# Patient Record
Sex: Male | Born: 1960 | Race: White | Hispanic: No | Marital: Single | Smoking: Current some day smoker
Health system: Southern US, Community
[De-identification: ages and names within clinical notes are randomized; demographics above are authoritative.]

## PROBLEM LIST (undated history)

## (undated) DIAGNOSIS — H269 Unspecified cataract: Secondary | ICD-10-CM

## (undated) DIAGNOSIS — E039 Hypothyroidism, unspecified: Secondary | ICD-10-CM

## (undated) DIAGNOSIS — A159 Respiratory tuberculosis unspecified: Secondary | ICD-10-CM

## (undated) DIAGNOSIS — I10 Essential (primary) hypertension: Secondary | ICD-10-CM

## (undated) DIAGNOSIS — J449 Chronic obstructive pulmonary disease, unspecified: Secondary | ICD-10-CM

## (undated) DIAGNOSIS — H9190 Unspecified hearing loss, unspecified ear: Secondary | ICD-10-CM

## (undated) DIAGNOSIS — E079 Disorder of thyroid, unspecified: Secondary | ICD-10-CM

## (undated) DIAGNOSIS — E58 Dietary calcium deficiency: Secondary | ICD-10-CM

## (undated) DIAGNOSIS — G4730 Sleep apnea, unspecified: Secondary | ICD-10-CM

## (undated) DIAGNOSIS — A389 Scarlet fever, uncomplicated: Secondary | ICD-10-CM

## (undated) DIAGNOSIS — J189 Pneumonia, unspecified organism: Secondary | ICD-10-CM

## (undated) HISTORY — DX: Essential (primary) hypertension: I10

## (undated) HISTORY — DX: Chronic obstructive pulmonary disease, unspecified: J44.9

## (undated) HISTORY — PX: COLONOSCOPY: SHX174

## (undated) HISTORY — DX: Unspecified cataract: H26.9

## (undated) HISTORY — DX: Dietary calcium deficiency: E58

## (undated) HISTORY — DX: Scarlet fever, uncomplicated: A38.9

## (undated) HISTORY — DX: Sleep apnea, unspecified: G47.30

## (undated) HISTORY — PX: CHOLECYSTECTOMY: SHX55

## (undated) HISTORY — PX: THYROIDECTOMY: SHX17

---

## 2020-05-03 ENCOUNTER — Other Ambulatory Visit: Payer: Self-pay

## 2020-05-03 ENCOUNTER — Other Ambulatory Visit: Payer: Self-pay | Admitting: Obstetrics and Gynecology

## 2020-05-03 ENCOUNTER — Ambulatory Visit
Admission: RE | Admit: 2020-05-03 | Discharge: 2020-05-03 | Disposition: A | Discharge status: Home / Self Care | Payer: Self-pay | Source: Ambulatory Visit | Attending: Obstetrics and Gynecology | Admitting: Obstetrics and Gynecology

## 2020-05-03 DIAGNOSIS — R918 Other nonspecific abnormal finding of lung field: Secondary | ICD-10-CM

## 2020-08-28 ENCOUNTER — Emergency Department (HOSPITAL_COMMUNITY)
Admission: EM | Admit: 2020-08-28 | Discharge: 2020-08-29 | Discharge status: Against Medical Advice | Payer: Medicare HMO

## 2020-08-28 NOTE — ED Notes (Signed)
LWBS 

## 2020-08-29 ENCOUNTER — Emergency Department (HOSPITAL_BASED_OUTPATIENT_CLINIC_OR_DEPARTMENT_OTHER)
Admission: EM | Admit: 2020-08-29 | Discharge: 2020-08-29 | Disposition: A | Discharge status: Home / Self Care | Payer: Medicare HMO | Attending: Emergency Medicine | Admitting: Emergency Medicine

## 2020-08-29 ENCOUNTER — Other Ambulatory Visit: Payer: Self-pay

## 2020-08-29 ENCOUNTER — Other Ambulatory Visit (HOSPITAL_BASED_OUTPATIENT_CLINIC_OR_DEPARTMENT_OTHER): Payer: Self-pay | Admitting: Emergency Medicine

## 2020-08-29 ENCOUNTER — Emergency Department (HOSPITAL_BASED_OUTPATIENT_CLINIC_OR_DEPARTMENT_OTHER): Payer: Medicare HMO

## 2020-08-29 ENCOUNTER — Encounter (HOSPITAL_BASED_OUTPATIENT_CLINIC_OR_DEPARTMENT_OTHER): Payer: Self-pay | Admitting: *Deleted

## 2020-08-29 DIAGNOSIS — K449 Diaphragmatic hernia without obstruction or gangrene: Secondary | ICD-10-CM | POA: Diagnosis not present

## 2020-08-29 DIAGNOSIS — M47816 Spondylosis without myelopathy or radiculopathy, lumbar region: Secondary | ICD-10-CM | POA: Diagnosis not present

## 2020-08-29 DIAGNOSIS — R109 Unspecified abdominal pain: Secondary | ICD-10-CM

## 2020-08-29 DIAGNOSIS — F17200 Nicotine dependence, unspecified, uncomplicated: Secondary | ICD-10-CM | POA: Insufficient documentation

## 2020-08-29 DIAGNOSIS — N12 Tubulo-interstitial nephritis, not specified as acute or chronic: Secondary | ICD-10-CM | POA: Insufficient documentation

## 2020-08-29 DIAGNOSIS — I708 Atherosclerosis of other arteries: Secondary | ICD-10-CM | POA: Diagnosis not present

## 2020-08-29 HISTORY — DX: Respiratory tuberculosis unspecified: A15.9

## 2020-08-29 HISTORY — DX: Disorder of thyroid, unspecified: E07.9

## 2020-08-29 HISTORY — DX: Unspecified hearing loss, unspecified ear: H91.90

## 2020-08-29 LAB — CBC WITH DIFFERENTIAL/PLATELET
Abs Immature Granulocytes: 0.02 10*3/uL (ref 0.00–0.07)
Basophils Absolute: 0 10*3/uL (ref 0.0–0.1)
Basophils Relative: 0 %
Eosinophils Absolute: 0 10*3/uL (ref 0.0–0.5)
Eosinophils Relative: 0 %
HCT: 44 % (ref 39.0–52.0)
Hemoglobin: 15.2 g/dL (ref 13.0–17.0)
Immature Granulocytes: 0 %
Lymphocytes Relative: 15 %
Lymphs Abs: 0.9 10*3/uL (ref 0.7–4.0)
MCH: 31.3 pg (ref 26.0–34.0)
MCHC: 34.5 g/dL (ref 30.0–36.0)
MCV: 90.7 fL (ref 80.0–100.0)
Monocytes Absolute: 0.8 10*3/uL (ref 0.1–1.0)
Monocytes Relative: 13 %
Neutro Abs: 4.2 10*3/uL (ref 1.7–7.7)
Neutrophils Relative %: 72 %
Platelets: 132 10*3/uL — ABNORMAL LOW (ref 150–400)
RBC: 4.85 MIL/uL (ref 4.22–5.81)
RDW: 11.8 % (ref 11.5–15.5)
WBC: 5.8 10*3/uL (ref 4.0–10.5)
nRBC: 0 % (ref 0.0–0.2)

## 2020-08-29 LAB — URINALYSIS, ROUTINE W REFLEX MICROSCOPIC
Glucose, UA: NEGATIVE mg/dL
Ketones, ur: 40 mg/dL — AB
Nitrite: NEGATIVE
Protein, ur: 30 mg/dL — AB
Specific Gravity, Urine: 1.03 (ref 1.005–1.030)
pH: 6 (ref 5.0–8.0)

## 2020-08-29 LAB — BASIC METABOLIC PANEL
Anion gap: 16 — ABNORMAL HIGH (ref 5–15)
BUN: 29 mg/dL — ABNORMAL HIGH (ref 6–20)
CO2: 21 mmol/L — ABNORMAL LOW (ref 22–32)
Calcium: 6.6 mg/dL — ABNORMAL LOW (ref 8.9–10.3)
Chloride: 101 mmol/L (ref 98–111)
Creatinine, Ser: 1.73 mg/dL — ABNORMAL HIGH (ref 0.61–1.24)
GFR, Estimated: 45 mL/min — ABNORMAL LOW (ref >60)
Glucose, Bld: 94 mg/dL (ref 70–99)
Potassium: 4.1 mmol/L (ref 3.5–5.1)
Sodium: 138 mmol/L (ref 135–145)

## 2020-08-29 LAB — URINALYSIS, MICROSCOPIC (REFLEX)

## 2020-08-29 MED ORDER — FENTANYL CITRATE (PF) 100 MCG/2ML IJ SOLN
50.0000 ug | Freq: Once | INTRAMUSCULAR | Status: AC
  Administered 2020-08-29: 50 ug via INTRAVENOUS
  Filled 2020-08-29: qty 2

## 2020-08-29 MED ORDER — HYDROCODONE-ACETAMINOPHEN 5-325 MG PO TABS: 1.0000 | ORAL_TABLET | Freq: Four times a day (QID) | ORAL | 0 refills | Status: DC | PRN

## 2020-08-29 MED ORDER — CEPHALEXIN 500 MG PO CAPS: 500.0000 mg | ORAL_CAPSULE | Freq: Three times a day (TID) | ORAL | 0 refills | Status: DC

## 2020-08-29 MED ORDER — SODIUM CHLORIDE 0.9 % IV BOLUS
1000.0000 mL | Freq: Once | INTRAVENOUS | Status: AC
  Administered 2020-08-29: 1000 mL via INTRAVENOUS

## 2020-08-29 MED ORDER — SODIUM CHLORIDE 0.9 % IV SOLN
1.0000 g | Freq: Once | INTRAVENOUS | Status: AC
  Administered 2020-08-29: 1 g via INTRAVENOUS
  Filled 2020-08-29: qty 10

## 2020-08-29 MED FILL — CEPHALEXIN 500 MG CAPSULE: 500 | 7 days supply | Qty: 21 | Fill #0

## 2020-08-29 MED FILL — HYDROCODON-APAP 5-325: 5-325 | 3 days supply | Qty: 12 | Fill #0

## 2020-08-29 NOTE — ED Triage Notes (Signed)
Lower back pain x 4 days. No appetite. Back pain increases with movement. He is ambulatory. He reads lips.

## 2020-08-29 NOTE — ED Provider Notes (Signed)
[facility] Provider Note  CSN: [id number] Arrival date & time: 08/29/20 1051    History Chief Complaint  Patient presents with  . Back Pain    HPI  Brett Campbell is a 60 y.o. male here for evaluation of R flank pain. He is deaf, reads lips, attempted ASL interpreter but he does not speak ASL well. He reports 4 days of R flank pain, non-radiating, worse with movement. No associated nausea, vomiting, fever or dysuria. No hematuria. No diarrhea or constipation.    Past Medical History:  Diagnosis Date  . Deaf   . Thyroid disease   . Tuberculosis    [place] Health department is contact with him due to hx.    History reviewed. No pertinent surgical history.  No family history on file.  Social History   Tobacco Use  . Smoking status: Current Some Day Smoker  . Smokeless tobacco: Never Used  Substance Use Topics  . Alcohol use: Yes  . Drug use: Never     Home Medications Prior to Admission medications   Medication Sig Start Date End Date Taking? Authorizing Provider  cephALEXin (KEFLEX) 500 MG capsule Take 1 capsule (500 mg total) by mouth 3 (three) times daily for 7 days. 08/29/20 09/05/20 Yes Truddie Hidden, MD  HYDROcodone-acetaminophen (NORCO/VICODIN) 5-325 MG tablet Take 1 tablet by mouth every 6 (six) hours as needed for severe pain. 08/29/20  Yes Truddie Hidden, MD  amLODipine (NORVASC) 5 MG tablet  08/02/20   [provider]  atorvastatin (LIPITOR) 10 MG tablet Take 10 mg by mouth daily. 08/02/20   [provider]  calcitRIOL (ROCALTROL) 0.5 MCG capsule Take 0.5 mcg by mouth daily. 08/02/20   [provider]  levothyroxine (SYNTHROID) 200 MCG tablet Take 200 mcg by mouth daily. 08/02/20   [provider]  levothyroxine (SYNTHROID) 25 MCG tablet Take 25 mcg by mouth daily. 08/02/20   [provider]  lisinopril (ZESTRIL) 10 MG tablet Take 10 mg by mouth daily. 08/02/20   [provider]  potassium chloride SA (KLOR-CON) 20 MEQ tablet Take 20 mEq by mouth daily. 08/02/20   [provider]  SPIRIVA RESPIMAT 1.25 MCG/ACT AERS SMARTSIG:2 Puff(s) Via Inhaler Daily 04/26/20   [provider]  sucralfate (CARAFATE) 1 g tablet Take 1 g by mouth 2 (two) times daily. 08/02/20   [provider]     Allergies    Patient has no known allergies.   Review of Systems   Review of Systems A comprehensive review of systems was completed and negative except as noted in HPI.    Physical Exam BP 131/72 (BP Location: Right Arm)   Pulse 73   Temp 98.3 F (36.8 C) (Oral)   Resp 16   Ht 5\' 11"  (1.803 m)   Wt 78.3 kg   SpO2 95% Comment: Simultaneous filing. User may not have seen previous data.  BMI 24.07 kg/m   Physical Exam Vitals and nursing note reviewed.  Constitutional:      Appearance: Normal appearance.  HENT:     Head: Normocephalic and atraumatic.     Nose: Nose normal.     Mouth/Throat:     Mouth: Mucous membranes are moist.  Eyes:     Extraocular Movements: Extraocular movements intact.     Conjunctiva/sclera: Conjunctivae normal.  Cardiovascular:     Rate and Rhythm: Normal rate.  Pulmonary:     Effort: Pulmonary effort is normal.  Breath sounds: Normal breath sounds.  Abdominal:     General: Abdomen is flat.     Palpations: Abdomen is soft.     Tenderness: There is no abdominal tenderness.  Genitourinary:    Comments: R CVA tenderness Musculoskeletal:        General: No swelling. Normal range of motion.     Cervical back: Neck supple.  Skin:    General: Skin is warm and dry.  Neurological:     General: No focal deficit present.     Mental Status: He is alert.  Psychiatric:        Mood and Affect: Mood normal.      ED Results / Procedures / Treatments   Labs (all labs ordered are listed, but only abnormal results are displayed) Labs Reviewed  URINALYSIS, ROUTINE W REFLEX MICROSCOPIC - Abnormal; Notable  for the following components:      Result Value   APPearance HAZY (*)    Hgb urine dipstick MODERATE (*)    Bilirubin Urine SMALL (*)    Ketones, ur 40 (*)    Protein, ur 30 (*)    Leukocytes,Ua TRACE (*)    All other components within normal limits  URINALYSIS, MICROSCOPIC (REFLEX) - Abnormal; Notable for the following components:   Bacteria, UA MANY (*)    All other components within normal limits  BASIC METABOLIC PANEL - Abnormal; Notable for the following components:   CO2 21 (*)    BUN 29 (*)    Creatinine, Ser 1.73 (*)    Calcium 6.6 (*)    GFR, Estimated 45 (*)    Anion gap 16 (*)    All other components within normal limits  CBC WITH DIFFERENTIAL/PLATELET - Abnormal; Notable for the following components:   Platelets 132 (*)    All other components within normal limits  URINE CULTURE    EKG None   Radiology CT Renal Stone Study  Result Date: 08/29/2020 CLINICAL DATA:  Right-sided flank pain EXAM: CT ABDOMEN AND PELVIS WITHOUT CONTRAST TECHNIQUE: Multidetector CT imaging of the abdomen and pelvis was performed following the standard protocol without oral or IV contrast. COMPARISON:  None. FINDINGS: Lower chest: There is a small granuloma in the right middle lobe inferiorly. Lung bases otherwise clear. There is a focal hiatal hernia. Hepatobiliary: No focal liver lesions are appreciable on this noncontrast enhanced study. The gallbladder is absent. There is no appreciable biliary duct dilatation. Pancreas: No pancreatic mass or inflammatory focus. Spleen: No splenic lesions are evident. Adrenals/Urinary Tract: Adrenals bilaterally appear normal. No evident renal mass or hydronephrosis on either side. There is no appreciable renal or ureteral calculus on either side. Urinary bladder is midline with wall thickness within normal limits. Stomach/Bowel: There is no appreciable bowel wall or mesenteric thickening. No evident bowel obstruction. Terminal ileum appears normal. No  appreciable free air or portal venous air. Vascular/Lymphatic: There is no abdominal aortic aneurysm. There is extensive aortic and iliac artery atherosclerotic calcification. No appreciable adenopathy in the abdomen or pelvis. Reproductive: Prostate and seminal vesicles within normal limits with respect to size and configuration. Other: The periappendiceal region appears unremarkable without inflammation. No abscess or ascites evident in the abdomen or pelvis. There is slight fat in the umbilicus. There is mild fat in the right inguinal ring. Musculoskeletal: No blastic or lytic bone lesions. Foci of degenerative change in the lumbar spine, most notably at L1-2. No intramuscular lesions appreciable. IMPRESSION: 1. No evident renal or ureteral calculus. No hydronephrosis on  either side. Urinary bladder wall thickness within normal limits. 2. No bowel wall thickening or bowel obstruction. No abscess in the abdomen pelvis. No periappendiceal region inflammation. 3.  Hiatal hernia present. 4.  Aortic Atherosclerosis (ICD10-I70.0). 5.  Gallbladder absent. Electronically Signed   By: Lowella Grip III M.D.   On: 08/29/2020 13:19    Procedures Procedures  Medications Ordered in the ED Medications  cefTRIAXone (ROCEPHIN) 1 g in sodium chloride 0.9 % 100 mL IVPB (1 g Intravenous New Bag/Given 08/29/20 1424)  fentaNYL (SUBLIMAZE) injection 50 mcg (50 mcg Intravenous Given 08/29/20 1252)  sodium chloride 0.9 % bolus 1,000 mL (0 mLs Intravenous Stopped 08/29/20 1441)     MDM Rules/Calculators/A&P MDM Patient's UA in triage concerning for infection will check labs and send for CT to rule out renal stone. Pain meds for comfort.  ED Course  I have reviewed the triage vital signs and the nursing notes.  Pertinent labs & imaging results that were available during my care of the patient were reviewed by me and considered in my medical decision making (see chart for details).  Clinical Course as of 08/29/20 1451   Thu Aug 29, 2020  1355 BMP with moderately elevated Cr, normal BUN but unknown baseline. No prior records to compare. His WBC is normal. CT neg for signs of obstruction or stone. Will give IVF and rocephin. Plan discharge with oral antibiotics and PCP follow up. Urine culture added.  [CS]  6629 Patient recently moved from [place] so no old records available. Denies any known history of kidney disease, but given mildly elevated Cr will avoid NSAID. Plan dishcarge as above with pain meds and referral to [facility] for recheck. [CS]    Clinical Course User Index [CS] Truddie Hidden, MD    Final Clinical Impression(s) / ED Diagnoses Final diagnoses:  Flank pain  Pyelonephritis    Rx / DC Orders ED Discharge Orders         Ordered    HYDROcodone-acetaminophen (NORCO/VICODIN) 5-325 MG tablet  Every 6 hours PRN        08/29/20 1415    cephALEXin (KEFLEX) 500 MG capsule  3 times daily        08/29/20 1415           Truddie Hidden, MD 08/29/20 1451

## 2020-08-29 NOTE — ED Notes (Signed)
Patient denies pain and is resting comfortably.  

## 2020-08-29 NOTE — ED Notes (Signed)
PT is aware that we need more urine for a urine culture and RN Roselyn Reef was informed

## 2020-08-29 NOTE — ED Notes (Signed)
Went to lab and asked about Urine culture

## 2020-08-30 LAB — URINE CULTURE: Culture: <10000 — AB

## 2020-09-17 DIAGNOSIS — Z1211 Encounter for screening for malignant neoplasm of colon: Secondary | ICD-10-CM | POA: Diagnosis not present

## 2020-09-17 DIAGNOSIS — K219 Gastro-esophageal reflux disease without esophagitis: Secondary | ICD-10-CM | POA: Diagnosis not present

## 2020-09-17 DIAGNOSIS — E039 Hypothyroidism, unspecified: Secondary | ICD-10-CM | POA: Diagnosis not present

## 2020-09-17 DIAGNOSIS — R7989 Other specified abnormal findings of blood chemistry: Secondary | ICD-10-CM | POA: Diagnosis not present

## 2020-09-17 DIAGNOSIS — J449 Chronic obstructive pulmonary disease, unspecified: Secondary | ICD-10-CM | POA: Diagnosis not present

## 2020-09-17 DIAGNOSIS — I10 Essential (primary) hypertension: Secondary | ICD-10-CM | POA: Diagnosis not present

## 2020-09-23 DIAGNOSIS — R799 Abnormal finding of blood chemistry, unspecified: Secondary | ICD-10-CM | POA: Diagnosis not present

## 2020-09-23 DIAGNOSIS — R7989 Other specified abnormal findings of blood chemistry: Secondary | ICD-10-CM | POA: Diagnosis not present

## 2020-10-08 ENCOUNTER — Encounter: Payer: Self-pay | Admitting: Physician Assistant

## 2020-10-08 ENCOUNTER — Ambulatory Visit (INDEPENDENT_AMBULATORY_CARE_PROVIDER_SITE_OTHER): Payer: Medicare HMO | Admitting: Physician Assistant

## 2020-10-08 VITALS — BP 140/80 | HR 82 | Ht 70.0 in | Wt 181.4 lb

## 2020-10-08 DIAGNOSIS — R195 Other fecal abnormalities: Secondary | ICD-10-CM | POA: Diagnosis not present

## 2020-10-08 MED ORDER — PLENVU 140 G PO SOLR: 1.0000 | ORAL | 0 refills | Status: DC

## 2020-10-08 NOTE — Patient Instructions (Signed)
If you are age 59 or older, your body mass index should be between 23-30. Your Body mass index is 26.03 kg/m. If this is out of the aforementioned range listed, please consider follow up with your Primary Care Provider.  If you are age 28 or younger, your body mass index should be between 19-25. Your Body mass index is 26.03 kg/m. If this is out of the aformentioned range listed, please consider follow up with your Primary Care Provider.   You have been scheduled for a colonoscopy. Please follow written instructions given to you at your visit today.  Please pick up your prep supplies at the pharmacy within the next 1-3 days. If you use inhalers (even only as needed), please bring them with you on the day of your procedure.  STOP Carafate  Follow up pending the results of your Colonoscopy.  Thank you for entrusting me with your care and choosing [facility].  Amy Esterwood, PA-C

## 2020-10-08 NOTE — Progress Notes (Signed)
Subjective:    Patient ID: Brett Campbell, male    DOB: Nov 15, 1960, 60 y.o.   MRN: [id number]  HPI  Brett Campbell is a pleasant 60 year old white male, new to GI today referred by Ferd Hibbs, NP/[facility], with Hemoccult positive stool with recent positive Hemosure.  CBC done on 08/29/2020 showed hemoglobin of 15.2.  Patient apparently had the Hemosure done as part of a routine physical as he was just establishing care.  Shortly thereafter he did develop a kidney infection and was treated with Cipro and noted one very dark stool at the end of that course of antibiotics. Patient himself has not noted any overt rectal bleeding.  He has no prior history of GI bleeding.  He has no complaints of abdominal discomfort, no changes in bowel habits no issues with constipation does have intermittent diarrhea which has been a chronic long-term problem with perhaps 1 or 2 episodes per week.  He denies any heartburn indigestion or nausea. Patient has history of hypertension, congenital deafness, hypothyroidism and hyperlipidemia.  He and his sister have recently relocated to [place] from [place].  His sister interprets for him and he also reads lips. Apparently has been on Carafate long-term though neither his sister or the patient are aware why he was ever placed on Carafate.  He has no history of peptic ulcer disease etc.  Patient did have prior colonoscopy done while living in [place] his sister believes this was 5 to 6 years ago and that the exam was "okay." There is no family history of colon cancer.  Review of Systems Pertinent positive and negative review of systems were noted in the above HPI section.  All other review of systems was otherwise negative.  Outpatient Encounter Medications as of 10/08/2020  Medication Sig  . amLODipine (NORVASC) 5 MG tablet   . atorvastatin (LIPITOR) 10 MG tablet Take 10 mg by mouth daily.  . calcitRIOL (ROCALTROL) 0.5 MCG capsule Take 0.5 mcg  by mouth 2 (two) times daily.  Marland Kitchen levothyroxine (SYNTHROID) 200 MCG tablet Take 200 mcg by mouth daily.  Marland Kitchen levothyroxine (SYNTHROID) 25 MCG tablet Take 25 mcg by mouth daily.  Marland Kitchen lisinopril (ZESTRIL) 10 MG tablet Take 10 mg by mouth daily.  Marland Kitchen PEG-KCl-NaCl-NaSulf-Na Asc-C (PLENVU) 140 g SOLR Take 1 kit by mouth as directed.  . potassium chloride SA (KLOR-CON) 20 MEQ tablet Take 20 mEq by mouth daily.  . rifampin (RIFADIN) 300 MG capsule Take 300 mg by mouth. Take 2 capsules by mouth once daily.  Marland Kitchen SPIRIVA RESPIMAT 1.25 MCG/ACT AERS SMARTSIG:2 Puff(s) Via Inhaler Daily  . sucralfate (CARAFATE) 1 g tablet Take 1 g by mouth 2 (two) times daily.  . [DISCONTINUED] HYDROcodone-acetaminophen (NORCO/VICODIN) 5-325 MG tablet Take 1 tablet by mouth every 6 (six) hours as needed for severe pain. (Patient not taking: Reported on 10/08/2020)   No facility-administered encounter medications on file as of 10/08/2020.   Allergies  Allergen Reactions  . Penicillins    There are no problems to display for this patient.  Social History   Socioeconomic History  . Marital status: Single    Spouse name: Not on file  . Number of children: Not on file  . Years of education: Not on file  . Highest education level: Not on file  Occupational History  . Not on file  Tobacco Use  . Smoking status: Current Every Day Smoker  . Smokeless tobacco: Never Used  Substance and Sexual Activity  . Alcohol use: Yes  .  Drug use: Yes    Types: Marijuana  . Sexual activity: Not Currently  Other Topics Concern  . Not on file  Social History Narrative  . Not on file   Social Determinants of Health   Financial Resource Strain: Not on file  Food Insecurity: Not on file  Transportation Needs: Not on file  Physical Activity: Not on file  Stress: Not on file  Social Connections: Not on file  Intimate Partner Violence: Not on file    Brett Campbell's family history includes CAD in his father and mother; Diabetes in his  mother and sister; Diabetes Mellitus II in his mother; Heart attack in his father; Heart disease in his father.      Objective:    Vitals:   10/08/20 0839  BP: 140/80  Pulse: 82  SpO2: 97%    Physical Exam Well-developed well-nourished WM in no acute distress.  Height, Weight, 181  BMI 26 pt is daef, accompanied by sister  HEENT; nontraumatic normocephalic, EOMI, PE R LA, sclera anicteric. Oropharynx; Neck; supple, no JVD Cardiovascular; regular rate and rhythm with S1-S2, no murmur rub or gallop Pulmonary; Clear bilaterally Abdomen; soft, nontender, nondistended, no palpable mass or hepatosplenomegaly, bowel sounds are active Rectal;not done - recent hemosure positive Skin; benign exam, no jaundice rash or appreciable lesions Extremities; no clubbing cyanosis or edema skin warm and dry Neuro/Psych; alert and oriented x4, grossly nonfocal mood and affect appropriate       Assessment & Plan:   #55 60 year old white male, congenital deafness referred with positive Hemosure.  His only GI complaint is intermittent episodes of diarrhea which he has had long-term and occurs once or twice per week.  Prior colonoscopy greater than 5 years ago done in [place] and reportedly "okay" per sister  Rule out occult neoplasm, colon polyps  #2 hypothyroidism #3Hypertension  #4 COPD no O2 use #5 hypocalcemia  Plan; patient has signed a release and will try to get copies of prior colonoscopy Patient will be scheduled for colonoscopy with Dr. Tarri Glenn.  Procedure was discussed in detail with the patient and his sister and they are agreeable to proceed. Patient has not completed COVID-19 vaccination and will be tested preprocedure.    Brett Campbell Genia Harold PA-C 10/08/2020   Cc: Ferd Hibbs, NP

## 2020-10-10 ENCOUNTER — Telehealth: Payer: Self-pay | Admitting: Physician Assistant

## 2020-10-10 NOTE — Telephone Encounter (Signed)
Inbound call from patient's sister returning your call.

## 2020-10-10 NOTE — Telephone Encounter (Signed)
Left message for [name], patient's sister to return call.

## 2020-10-10 NOTE — Telephone Encounter (Signed)
Inbound call from patient's sister stating even with coupon Plenvu is $80 and is wanting to know if something more affordable can be sent to the pharmacy.  Please advise.

## 2020-10-11 NOTE — Telephone Encounter (Signed)
Left voicemail for patient's sister to return phone call

## 2020-10-11 NOTE — Telephone Encounter (Signed)
Patient's sister returned call, I advised that samples of Plenvu are going to be coming in on Monday and I would hold one if she would be able to come get it for her brother. She did state that she would be able to do so. I advise I would call when it was ready at the front desk.

## 2020-10-16 ENCOUNTER — Encounter: Payer: Self-pay | Admitting: Gastroenterology

## 2020-10-16 NOTE — Telephone Encounter (Signed)
Pt's sister is checking on the status of samples

## 2020-10-16 NOTE — Telephone Encounter (Signed)
Called Brett Campbell back and let her know that samples came in today and that I have left one at the front desk to pick up.

## 2020-10-18 ENCOUNTER — Other Ambulatory Visit: Payer: Self-pay | Admitting: Gastroenterology

## 2020-10-18 LAB — SARS CORONAVIRUS 2 (TAT 6-24 HRS): SARS Coronavirus 2: NEGATIVE

## 2020-10-18 NOTE — Progress Notes (Signed)
Reviewed and agree with management plans. ? ?Echo Propp L. Sol Odor, MD, MPH  ?

## 2020-10-21 ENCOUNTER — Other Ambulatory Visit: Payer: Self-pay

## 2020-10-21 ENCOUNTER — Ambulatory Visit (AMBULATORY_SURGERY_CENTER): Payer: Medicare HMO | Admitting: Gastroenterology

## 2020-10-21 ENCOUNTER — Encounter: Payer: Self-pay | Admitting: Gastroenterology

## 2020-10-21 ENCOUNTER — Encounter: Payer: Medicare HMO | Admitting: Gastroenterology

## 2020-10-21 ENCOUNTER — Other Ambulatory Visit (INDEPENDENT_AMBULATORY_CARE_PROVIDER_SITE_OTHER): Payer: Medicare HMO

## 2020-10-21 VITALS — BP 119/95 | HR 84 | Temp 98.6°F | Resp 14 | Ht 70.0 in | Wt 181.0 lb

## 2020-10-21 DIAGNOSIS — D122 Benign neoplasm of ascending colon: Secondary | ICD-10-CM

## 2020-10-21 DIAGNOSIS — D127 Benign neoplasm of rectosigmoid junction: Secondary | ICD-10-CM | POA: Diagnosis not present

## 2020-10-21 DIAGNOSIS — D123 Benign neoplasm of transverse colon: Secondary | ICD-10-CM

## 2020-10-21 DIAGNOSIS — D120 Benign neoplasm of cecum: Secondary | ICD-10-CM | POA: Diagnosis not present

## 2020-10-21 DIAGNOSIS — K635 Polyp of colon: Secondary | ICD-10-CM

## 2020-10-21 DIAGNOSIS — D128 Benign neoplasm of rectum: Secondary | ICD-10-CM

## 2020-10-21 DIAGNOSIS — D124 Benign neoplasm of descending colon: Secondary | ICD-10-CM | POA: Diagnosis not present

## 2020-10-21 DIAGNOSIS — D125 Benign neoplasm of sigmoid colon: Secondary | ICD-10-CM | POA: Diagnosis not present

## 2020-10-21 DIAGNOSIS — Z86010 Personal history of colonic polyps: Secondary | ICD-10-CM | POA: Diagnosis not present

## 2020-10-21 DIAGNOSIS — R195 Other fecal abnormalities: Secondary | ICD-10-CM

## 2020-10-21 DIAGNOSIS — Z1211 Encounter for screening for malignant neoplasm of colon: Secondary | ICD-10-CM | POA: Diagnosis not present

## 2020-10-21 LAB — COMPREHENSIVE METABOLIC PANEL
ALT: 22 U/L (ref 0–53)
AST: 25 U/L (ref 0–37)
Albumin: 4.3 g/dL (ref 3.5–5.2)
Alkaline Phosphatase: 59 U/L (ref 39–117)
BUN: 12 mg/dL (ref 6–23)
CO2: 22 mEq/L (ref 19–32)
Calcium: 8 mg/dL — ABNORMAL LOW (ref 8.4–10.5)
Chloride: 108 mEq/L (ref 96–112)
Creatinine, Ser: 1.22 mg/dL (ref 0.40–1.50)
GFR: 65.26 mL/min (ref >60.00)
Glucose, Bld: 89 mg/dL (ref 70–99)
Potassium: 5.1 mEq/L (ref 3.5–5.1)
Sodium: 142 mEq/L (ref 135–145)
Total Bilirubin: 0.6 mg/dL (ref 0.2–1.2)
Total Protein: 7.3 g/dL (ref 6.0–8.3)

## 2020-10-21 MED ORDER — SODIUM CHLORIDE 0.9 % IV SOLN: 500.0000 mL | Freq: Once | INTRAVENOUS | Status: DC

## 2020-10-21 NOTE — Progress Notes (Signed)
Called to room to assist during endoscopic procedure.  Patient ID and intended procedure confirmed with present staff. Received instructions for my participation in the procedure from the performing physician.  

## 2020-10-21 NOTE — Progress Notes (Signed)
To PACU, VSS. Report to RN.tb 

## 2020-10-21 NOTE — Op Note (Addendum)
[facility] Patient Name: Brett Campbell Procedure Date: 10/21/2020 7:26 AM MRN: [id number] Endoscopist: Thornton Park MD, MD Age: 60 Referring MD:  Date of Birth: 07-04-1961 Gender: Male Account #: [id number] Procedure:                Colonoscopy Indications:              Heme positive stool                           Prior colonoscopy 5-6 years ago in [place] that                            was "okay"                           No known family history of colon cancer or polyps Medicines:                Monitored Anesthesia Care Procedure:                Pre-Anesthesia Assessment:                           - Prior to the procedure, a History and Physical                            was performed, and patient medications and                            allergies were reviewed. The patient's tolerance of                            previous anesthesia was also reviewed. The risks                            and benefits of the procedure and the sedation                            options and risks were discussed with the patient.                            All questions were answered, and informed consent                            was obtained. Prior Anticoagulants: The patient has                            taken no previous anticoagulant or antiplatelet                            agents. ASA Grade Assessment: II - A patient with                            mild systemic disease. After reviewing the risks  and benefits, the patient was deemed in                            satisfactory condition to undergo the procedure.                           After obtaining informed consent, the colonoscope                            was passed under direct vision. Throughout the                            procedure, the patient's blood pressure, pulse, and                            oxygen saturations were monitored continuously. The                             Olympus CF-HQ190 [id number]) Colonoscope was                            introduced through the anus and advanced to the 3                            cm into the ileum. The colonoscopy was performed                            with difficulty due to a redundant colon and                            significant looping. Successful completion of the                            procedure was aided by applying abdominal pressure.                            The patient tolerated the procedure well. The                            quality of the bowel preparation was good. The                            terminal ileum, ileocecal valve, appendiceal                            orifice, and rectum were photographed. Scope In: 8:10:48 AM Scope Out: 8:54:19 AM Scope Withdrawal Time: 0 hours 38 minutes 36 seconds  Total Procedure Duration: 0 hours 43 minutes 31 seconds  Findings:                 The perianal and digital rectal examinations were                            normal.  Non-bleeding internal hemorrhoids were found.                           Many sessile polyps were found in the rectum,                            sigmoid colon, splenic flexure, transverse colon,                            ascending colon and cecum. The polyps were 1 to 8                            mm in size. These polyps were removed with a cold                            snare. Resection and retrieval were complete.                            Estimated blood loss was minimal.                           A 15 mm polyp was found in the descending colon, 46                            cm from the anal verge. The polyp was pedunculated.                            The polyp was removed with a hot snare. Resection                            and retrieval were complete. Estimated blood loss                            was minimal.                           A large carpeted, multilobulated, ulcerated polyp                             was found in the cecum. It extends from the fold of                            the IC valve to an area adjacent to the appendiceal                            orifice. It is at least 3cm in size. It does not                            appear to involved the IC valve. Biopsies were                            taken with a cold forceps for histology. Area was  tattooed with an injection of 2 mL of Spot (carbon                            black).                           The exam was otherwise without abnormality on                            direct and retroflexion views. Complications:            No immediate complications. Estimated blood loss:                            Minimal. Estimated Blood Loss:     Estimated blood loss was minimal. Impression:               - Non-bleeding internal hemorrhoids.                           - Many 1 to 8 mm polyps in the rectum, in the                            sigmoid colon, at the splenic flexure, in the                            transverse colon, in the ascending colon and in the                            cecum, removed with a cold snare. Resected and                            retrieved.                           - One 15 mm polyp in the descending colon, removed                            with a hot snare. Resected and retrieved.                           - One large polyp in the cecum. Biopsied. Tattooed.                           - The examination was otherwise normal on direct                            and retroflexion views. Recommendation:           - Patient has a contact number available for                            emergencies. The signs and symptoms of potential  delayed complications were discussed with the                            patient. Return to normal activities tomorrow.                            Written discharge instructions were provided to the                             patient.                           - Resume previous diet.                           - Continue present medications.                           - Await pathology results.                           - CT abd/pelvis with contrast                           - CMP - for renal function and liver enzymes                           - May need to consider surgical referral for                            removal of ascending colon polyp after reviewing                            the CT scan.                           - Given the number of polyps removed today, I                            recommend that you consult with a genetic counselor.                           - Emerging evidence supports eating a diet of                            fruits, vegetables, grains, calcium, and yogurt                            while reducing red meat and alcohol may reduce the                            risk of colon cancer.                           - Given these results, all first degree relatives                            (  brothers, sisters, children, parents) should start                            colon cancer screening at age 69.                           - Thank you for allowing me to be involved in your                            colon cancer prevention. Thornton Park MD, MD 10/21/2020 9:05:39 AM This report has been signed electronically.

## 2020-10-21 NOTE — Progress Notes (Signed)
Gerald Stabs from 4th floor [facility] called and asked that a CMP be ordered for this patient for Dr. Tarri Glenn for Hx of colon polyp. Order placed.

## 2020-10-21 NOTE — Patient Instructions (Signed)
Handout given:  Polyps Resume previous diet  continue present medications Await pathology results Will set up CT and do labs work today  YOU HAD AN ENDOSCOPIC PROCEDURE TODAY AT [facility]:   Refer to the procedure report that was given to you for any specific questions about what was found during the examination.  If the procedure report does not answer your questions, please call your gastroenterologist to clarify.  If you requested that your care partner not be given the details of your procedure findings, then the procedure report has been included in a sealed envelope for you to review at your convenience later.  YOU SHOULD EXPECT: Some feelings of bloating in the abdomen. Passage of more gas than usual.  Walking can help get rid of the air that was put into your GI tract during the procedure and reduce the bloating. If you had a lower endoscopy (such as a colonoscopy or flexible sigmoidoscopy) you may notice spotting of blood in your stool or on the toilet paper. If you underwent a bowel prep for your procedure, you may not have a normal bowel movement for a few days.  Please Note:  You might notice some irritation and congestion in your nose or some drainage.  This is from the oxygen used during your procedure.  There is no need for concern and it should clear up in a day or so.  SYMPTOMS TO REPORT IMMEDIATELY:   Following lower endoscopy (colonoscopy or flexible sigmoidoscopy):  Excessive amounts of blood in the stool  Significant tenderness or worsening of abdominal pains  Swelling of the abdomen that is new, acute  Fever of 100F or higher  For urgent or emergent issues, a gastroenterologist can be reached at any hour by calling [phone]. Do not use MyChart messaging for urgent concerns.   DIET:  We do recommend a small meal at first, but then you may proceed to your regular diet.  Drink plenty of fluids but you should avoid alcoholic beverages for 24  hours.  ACTIVITY:  You should plan to take it easy for the rest of today and you should NOT DRIVE or use heavy machinery until tomorrow (because of the sedation medicines used during the test).    FOLLOW UP: Our staff will call the number listed on your records 48-72 hours following your procedure to check on you and address any questions or concerns that you may have regarding the information given to you following your procedure. If we do not reach you, we will leave a message.  We will attempt to reach you two times.  During this call, we will ask if you have developed any symptoms of COVID 19. If you develop any symptoms (ie: fever, flu-like symptoms, shortness of breath, cough etc.) before then, please call [phone].  If you test positive for Covid 19 in the 2 weeks post procedure, please call and report this information to [place].    If any biopsies were taken you will be contacted by phone or by letter within the next 1-3 weeks.  Please call us at [phone] if you have not heard about the biopsies in 3 weeks.   SIGNATURES/CONFIDENTIALITY: You and/or your care partner have signed paperwork which will be entered into your electronic medical record.  These signatures attest to the fact that that the information above on your After Visit Summary has been reviewed and is understood.  Full responsibility of the confidentiality of this discharge information lies with  you and/or your care-partner.

## 2020-10-21 NOTE — Progress Notes (Signed)
Vitals by CW.  Admission completed with interpreter Margaretha Sheffield

## 2020-10-22 ENCOUNTER — Telehealth: Payer: Self-pay

## 2020-10-22 NOTE — Telephone Encounter (Signed)
Pt had cmet drawn yesterday. Referral in epic for genetic counseling. Order in epic for CT of Abd/Pelvis.

## 2020-10-22 NOTE — Telephone Encounter (Signed)
-----   Message from Thornton Park, MD sent at 10/21/2020  9:05 AM EST ----- Large cecal polyp, 10+ colon polyps. Please arrange:  (1) CT abd/pelvis with contrast (2) BMP (3) genetic counseling referral  Thank you.   KLB

## 2020-10-22 NOTE — Telephone Encounter (Signed)
Pt scheduled for CT scan of A/P at [facility] 10/30/20 at 3:30pm. Pt to arrive there at 3:15pm. Pt to be NPO after 11:30am, except he needs to drink bottle 1 of contrast at 1:30pm and bottle 2 at 2:30pm. Left message for pt to call back.

## 2020-10-23 ENCOUNTER — Telehealth: Payer: Self-pay | Admitting: *Deleted

## 2020-10-23 ENCOUNTER — Telehealth: Payer: Self-pay

## 2020-10-23 NOTE — Telephone Encounter (Signed)
Attempted to reach patient for post-procedure f/u call. No answer. Left message that we will make another attempt to reach him later today and for him to please not hesitate to call us if he has any questions/concerns regarding his care.

## 2020-10-23 NOTE — Telephone Encounter (Signed)
  Follow up Call-  Call back number 10/21/2020  Post procedure Call Back phone  # [phone] ( sister [name])  Permission to leave phone message Yes     Patient questions:  Do you have a fever, pain , or abdominal swelling? No. Pain Score  0 *  Have you tolerated food without any problems? Yes.    Have you been able to return to your normal activities? Yes.    Do you have any questions about your discharge instructions: Diet   No. Medications  No. Follow up visit  No.  Do you have questions or concerns about your Care? No.  Actions: * If pain score is 4 or above: No action needed, pain <4. Pt is hearing impaired.   Spoke with pts sister.  She states that pt is "doing fine".

## 2020-10-24 NOTE — Telephone Encounter (Signed)
Spoke with pts sister and she is aware of appt and instructions.

## 2020-10-30 ENCOUNTER — Encounter (HOSPITAL_COMMUNITY): Payer: Self-pay

## 2020-10-30 ENCOUNTER — Ambulatory Visit (HOSPITAL_COMMUNITY)
Admission: RE | Admit: 2020-10-30 | Discharge: 2020-10-30 | Disposition: A | Discharge status: Home / Self Care | Payer: Medicare HMO | Source: Ambulatory Visit | Attending: Gastroenterology | Admitting: Gastroenterology

## 2020-10-30 DIAGNOSIS — R109 Unspecified abdominal pain: Secondary | ICD-10-CM | POA: Diagnosis not present

## 2020-10-30 DIAGNOSIS — K635 Polyp of colon: Secondary | ICD-10-CM | POA: Insufficient documentation

## 2020-10-30 DIAGNOSIS — I714 Abdominal aortic aneurysm, without rupture: Secondary | ICD-10-CM | POA: Diagnosis not present

## 2020-10-30 MED ORDER — IOHEXOL 300 MG/ML  SOLN
100.0000 mL | Freq: Once | INTRAMUSCULAR | Status: AC | PRN
  Administered 2020-10-30: 100 mL via INTRAVENOUS

## 2020-10-31 ENCOUNTER — Other Ambulatory Visit: Payer: Self-pay

## 2020-10-31 ENCOUNTER — Inpatient Hospital Stay: Payer: Medicare HMO | Attending: Internal Medicine | Admitting: Genetic Counselor

## 2020-10-31 DIAGNOSIS — Z86010 Personal history of colonic polyps: Secondary | ICD-10-CM

## 2020-10-31 DIAGNOSIS — Z1379 Encounter for other screening for genetic and chromosomal anomalies: Secondary | ICD-10-CM

## 2020-11-03 ENCOUNTER — Encounter: Payer: Self-pay | Admitting: Genetic Counselor

## 2020-11-03 DIAGNOSIS — Z860100 Personal history of colon polyps, unspecified: Secondary | ICD-10-CM | POA: Insufficient documentation

## 2020-11-03 HISTORY — DX: Personal history of colonic polyps: Z86.010

## 2020-11-03 NOTE — Progress Notes (Signed)
REFERRING PROVIDER: Thornton Park, MD [place],  [place] [address]  PRIMARY PROVIDER:  Ferd Hibbs, NP  PRIMARY REASON FOR VISIT:  1. Personal history of colonic polyps     I connected with Brett Campbell on 10/31/2020 at [facility] EDT by [place] video conference and verified that I am speaking with the correct person using two identifiers.   Patient location: Home Provider location: [facility] Office  HISTORY OF PRESENT ILLNESS:   Brett Campbell, a 60 y.o. male, was seen for a [place] cancer genetics consultation at the request of Dr. Tarri Campbell due to a personal history of colon polyps.  Brett Campbell presents to clinic to, with his niece, Brett Campbell, to discuss the possibility of a hereditary predisposition to cancer, to discuss genetic testing, and to further clarify his future cancer risks, as well as potential cancer risks for family members.   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  No high grade dysplasia was detected. Brett Campbell reported that this was his first colonoscopy.   CANCER HISTORY:  Oncology History   No history exists.    RISK FACTORS:  Colonoscopy: yes; see history noted above Prostate cancer screening: none reported  Past Medical History:  Diagnosis Date  . Calcium deficiency   . Cataract   . COPD (chronic obstructive pulmonary disease) ([place])   . Deaf   . Hypertension   . Personal history of colonic polyps 11/03/2020  . Scarlet fever   . Sleep apnea    no longer uses CPAP  . Thyroid disease   . Tuberculosis    [place]. Health department is contact with him due to hx.    Past Surgical History:  Procedure Laterality Date  . COLONOSCOPY     +5years  . THYROIDECTOMY      Social History   Socioeconomic History  . Marital status: Single    Spouse name: Not on file  . Number of children: Not on file  . Years of education: Not on file  . Highest education level: Not  on file  Occupational History  . Not on file  Tobacco Use  . Smoking status: Current Every Day Smoker    Types: Cigarettes  . Smokeless tobacco: Never Used  . Tobacco comment: pt states he smokes 1 cigarette per day  Substance and Sexual Activity  . Alcohol use: Yes    Alcohol/week: 1.0 - 2.0 standard drink    Types: 1 - 2 Cans of beer per week  . Drug use: Yes    Types: Marijuana  . Sexual activity: Not Currently  Other Topics Concern  . Not on file  Social History Narrative  . Not on file   Social Determinants of Health   Financial Resource Strain: Not on file  Food Insecurity: Not on file  Transportation Needs: Not on file  Physical Activity: Not on file  Stress: Not on file  Social Connections: Not on file     FAMILY HISTORY:  We obtained a detailed, 4-generation family history.  Significant diagnoses are listed below: Family History  Problem Relation Age of Onset  . Colon cancer Neg Hx   . Esophageal cancer Neg Hx   . Stomach cancer Neg Hx   . Colon polyps Neg Hx     Brett Campbell has no children two maternal half sisters.  His mother passed away after the age of 31, and his father passed away at age 19.  No family history of cancer or colon polyps were reported.   Brett Campbell is unaware of previous family history of genetic testing for hereditary cancer risks. There is no reported Ashkenazi Jewish ancestry. There is no known consanguinity.  GENETIC COUNSELING ASSESSMENT: Brett Campbell is a 60 y.o. male with a personal history of colon polyps which is somewhat suggestive of a hereditary cancer syndrome and predisposition to cancer/polyps given his number of lifetime colon polyps. We, therefore, discussed and recommended the following at today's visit.   DISCUSSION: We discussed that polyps in general are common, however, most people have fewer than 5 lifetime polyps.  When an individual has 10 or more polyps we become concerned about an underlying polyposis  syndrome.  The most common hereditary polyposis syndromes are Familial Adenomatous Polyposis (FAP), caused by mutations in the APC gene, and MUTYH-Associated Polyposis (MAP), caused by mutations in the MUTYH gene.  There are other genes that are associated with polyposis, such as NTHL1 and MSH3.  We discussed that testing is beneficial for several reasons, including knowing about cancer risks, identifying potential screening and risk-reduction options that may be appropriate, and to understand if other family members could be at risk for colon polyps and/or cancer and allow them to undergo genetic testing.   We reviewed the characteristics, features and inheritance patterns of hereditary cancer syndromes. We also discussed genetic testing, including the appropriate family members to test, the process of testing, insurance coverage and turn-around-time for results. We discussed the implications of a negative, positive and/or variant of uncertain significant result. We recommended Brett Campbell pursue genetic testing for a panel that includes genes associated with polyposis.  The [facility]-Cancer+RNAinsight panel offered by [facility] includes sequencing and rearrangement analysis for the following 47 genes:  APC, ATM, AXIN2, BARD1, BMPR1A, BRCA1, BRCA2, BRIP1, CDH1, CDK4, CDKN2A, CHEK2, DICER1, EPCAM, GREM1, HOXB13, MEN1, MLH1, MSH2, MSH3, MSH6, MUTYH, NBN, NF1, NF2, NTHL1, PALB2, PMS2, POLD1, POLE, PTEN, RAD51C, RAD51D, RECQL, RET, SDHA, SDHAF2, SDHB, SDHC, SDHD, SMAD4, SMARCA4, STK11, TP53, TSC1, TSC2, and VHL.  RNA data is routinely analyzed for use in variant interpretation for all genes.  Based on Brett Campbell's personal history of colon polyps, he meets medical criteria for genetic testing. Despite that he meets criteria, he may still have an out of pocket cost.   We discussed that some people do not want to undergo genetic testing due to fear of genetic discrimination.  A federal law called the  Genetic Information Non-Discrimination Act (GINA) of 2008 helps protect individuals against genetic discrimination based on their genetic test results.  It impacts both health insurance and employment.  With health insurance, it protects against increased premiums, being kicked off insurance or being forced to take a test in order to be insured.  For employment it protects against hiring, firing and promoting decisions based on genetic test results.  GINA does not apply to those in the [employer], those who work for companies with less than 15 employees, and new life insurance or long-term disability insurance policies.  Health status due to a cancer diagnosis is not protected under GINA.   PLAN: After considering the risks, benefits, and limitations, Brett Campbell is interested in genetic testing but wishes to learn more about his estimated out of pocket cost before submitting a sample.  A pre-verification request was submitted to [facility] to receive an estimated out of pocket cost.  Out of pocket estimate should be received in approximately one week, which will then be communicated  to Mr. Bremner.  Brett Campbell gave permission to call his niece, Brett Campbell, with this information.   Lastly, we encouraged Mr. Proano to remain in contact with cancer genetics annually so that we can continuously update the family history and inform him of any changes in cancer genetics and testing that may be of benefit for this family.   Mr. Jenkins [place] questions were answered to his satisfaction today. Our contact information was provided should additional questions or concerns arise. Thank you for the referral and allowing [place] to share in the care of your patient.   Cari M. Joette Catching, [place], [facility] Genetic Counselor [email or web address] (P) [phone]  The patient was seen for a total of 30 minutes in face-to-face genetic counseling.  Drs. Magrinat, Lindi Adie and/or Burr Medico were available to discuss this case  as needed.    _______________________________________________________________________ For Office Staff:  Number of people involved in session: 1 Was an Intern/ student involved with case: no

## 2020-11-14 ENCOUNTER — Telehealth: Payer: Self-pay | Admitting: Genetic Counselor

## 2020-11-14 NOTE — Telephone Encounter (Signed)
Called niece Brett Campbell to inform her that Brett Campbell is expected to have no OOP cost for genetic testing but authorization for insurance is required.  Will call back with authorization status when obtained by laboratory.

## 2020-11-22 ENCOUNTER — Ambulatory Visit: Payer: Medicare HMO | Admitting: Internal Medicine

## 2020-11-22 ENCOUNTER — Other Ambulatory Visit: Payer: Self-pay

## 2020-11-22 ENCOUNTER — Encounter: Payer: Self-pay | Admitting: Internal Medicine

## 2020-11-22 VITALS — BP 124/90 | HR 90 | Ht 70.0 in | Wt 179.1 lb

## 2020-11-22 DIAGNOSIS — E208 Other hypoparathyroidism: Secondary | ICD-10-CM

## 2020-11-22 DIAGNOSIS — E039 Hypothyroidism, unspecified: Secondary | ICD-10-CM | POA: Insufficient documentation

## 2020-11-22 DIAGNOSIS — E890 Postprocedural hypothyroidism: Secondary | ICD-10-CM

## 2020-11-22 LAB — COMPREHENSIVE METABOLIC PANEL WITH GFR
ALT: 26 U/L (ref 0–53)
AST: 23 U/L (ref 0–37)
Albumin: 4.4 g/dL (ref 3.5–5.2)
Alkaline Phosphatase: 54 U/L (ref 39–117)
BUN: 20 mg/dL (ref 6–23)
CO2: 23 meq/L (ref 19–32)
Calcium: 7.5 mg/dL — ABNORMAL LOW (ref 8.4–10.5)
Chloride: 103 meq/L (ref 96–112)
Creatinine, Ser: 1.01 mg/dL (ref 0.40–1.50)
GFR: 81.81 mL/min
Glucose, Bld: 91 mg/dL (ref 70–99)
Potassium: 4.1 meq/L (ref 3.5–5.1)
Sodium: 138 meq/L (ref 135–145)
Total Bilirubin: 0.6 mg/dL (ref 0.2–1.2)
Total Protein: 7.4 g/dL (ref 6.0–8.3)

## 2020-11-22 LAB — VITAMIN D 25 HYDROXY (VIT D DEFICIENCY, FRACTURES): VITD: 20.23 ng/mL — ABNORMAL LOW (ref 30.00–100.00)

## 2020-11-22 LAB — TSH: TSH: 1.34 u[IU]/mL (ref 0.35–4.50)

## 2020-11-22 MED ORDER — VITAMIN D3 25 MCG (1000 UNIT) PO TABS: 1000.0000 [IU] | ORAL_TABLET | Freq: Every day | ORAL | 3 refills | Status: DC

## 2020-11-22 MED ORDER — CALTRATE 600+D PLUS MINERALS 600-800 MG-UNIT PO CHEW: 1200.0000 mg | CHEWABLE_TABLET | Freq: Two times a day (BID) | ORAL | 3 refills | Status: DC

## 2020-11-22 MED ORDER — LEVOTHYROXINE SODIUM 50 MCG PO TABS: 50.0000 ug | ORAL_TABLET | Freq: Every day | ORAL | 3 refills | Status: DC

## 2020-11-22 MED ORDER — LEVOTHYROXINE SODIUM 200 MCG PO TABS: 200.0000 ug | ORAL_TABLET | Freq: Every day | ORAL | 1 refills | Status: DC

## 2020-11-22 NOTE — Progress Notes (Signed)
Name: Brett Campbell  MRN/ DOB: [id number], 09/14/1960    Age/ Sex: 60 y.o., male    PCP: Brett Bosch, NP   Reason for Endocrinology Evaluation: Hypocalcemia      Date of Initial Endocrinology Evaluation: 11/22/2020     HPI: Brett Campbell is a 60 y.o. 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 The patient presented for initial endocrinology clinic visit on 11/22/2020 for consultative assistance with his hypocalcemia      Moved from [place]   He is accompanied by his sister Brett Campbell and sign language interpreter Brett Campbell   Lives with sister    HYPOCALCEMIA HISTORY  He was noted with hypocalcemia  Many years ago. Pt is a poor historian and most of the history was obtained from the sister.  Pt is not aware of history of hypocalcemia  Per sister he has been diagnosed with this many years ago . He is on 2 Tums TID , and calcitriol 2 a day.  NO D3 prescription  Has not taken MVI in a couple of weeks   Denies radiation exposure     HYPOTHYROID HISTORY:  S/P total thyroidectomy at age 89 due to goiter . Has been on LT-4 replacement since then. Has history of non compliance requiring hospitalization. Since he has been living with the sister, compliance has improved. He is using a pill box.   Today he denies muscle spasms  Denies perioral tingling/numbness Has mild fatigue  Denies SOB or dysphagia   Has been taking benadryl for allergies   Unknown FH of thyroid disease     HOME ENDOCRINE MEDICATIONS Calcitriol 0.5 mcg BID TUMS 2 tabs TID  Levothyroxine 200 mcg daily PLUS  Levothyroxine 50 mcg daily ( currently taking 2 of the 25 mcg )      HISTORY:  Past Medical History:  Past Medical History:  Diagnosis Date  . Calcium deficiency   . Cataract   . COPD (chronic obstructive pulmonary disease) (HCC)   . Deaf   . Hypertension   . Personal history of colonic polyps 11/03/2020  .  Scarlet fever   . Sleep apnea    no longer uses CPAP  . Thyroid disease   . Tuberculosis    [place]. Health department is contact with him due to hx.   Past Surgical History:  Past Surgical History:  Procedure Laterality Date  . COLONOSCOPY     +5years  . THYROIDECTOMY      Social History:  reports that he has been smoking cigarettes. He has never used smokeless tobacco. He reports current alcohol use of about 1.0 - 2.0 standard drink of alcohol per week. He reports current drug use. Drug: Marijuana. Family History: family history includes CAD in his father and mother; Diabetes in his mother and sister; Diabetes Mellitus II in his mother; Heart attack in his father; Heart disease in his father.   HOME MEDICATIONS: Allergies as of 11/22/2020      Reactions   Penicillins       Medication List       Accurate as of November 22, 2020 11:10 AM. If you have any questions, ask your nurse or doctor.        STOP taking these medications   sucralfate 1 g tablet Commonly known as: CARAFATE Stopped by: Brett Shorts, MD     TAKE these medications   amLODipine 5 MG tablet Commonly known  as: NORVASC   atorvastatin 10 MG tablet Commonly known as: LIPITOR Take 10 mg by mouth daily.   calcitRIOL 0.5 MCG capsule Commonly known as: ROCALTROL Take 0.5 mcg by mouth 2 (two) times daily.   Caltrate 600+D Plus Minerals 600-800 MG-UNIT Chew Chew 1,200 mg by mouth 2 (two) times daily. Started by: Brett Shorts, MD   levothyroxine 200 MCG tablet Commonly known as: SYNTHROID Take 200 mcg by mouth daily.   levothyroxine 25 MCG tablet Commonly known as: SYNTHROID Take 25 mcg by mouth daily.   lisinopril 10 MG tablet Commonly known as: ZESTRIL Take 10 mg by mouth daily.   potassium chloride SA 20 MEQ tablet Commonly known as: KLOR-CON Take 20 mEq by mouth daily.   rifampin 300 MG capsule Commonly known as: RIFADIN Take 300 mg by mouth. Take 2 capsules by mouth once  daily.   Spiriva Respimat 1.25 MCG/ACT Aers Generic drug: Tiotropium Bromide Monohydrate SMARTSIG:2 Puff(s) Via Inhaler Daily         REVIEW OF SYSTEMS: A comprehensive ROS was conducted with the patient and is negative except as per HPI      OBJECTIVE:  VS: BP 124/90   Pulse 90   Ht 5\' 10"  (1.778 m)   Wt 179 lb 2 oz (81.3 kg)   BMI 25.70 kg/m    Wt Readings from Last 3 Encounters:  11/22/20 179 lb 2 oz (81.3 kg)  10/21/20 181 lb (82.1 kg)  10/08/20 181 lb 6.4 oz (82.3 kg)     EXAM: General: Pt appears well and is in NAD  Neck: General: Supple without adenopathy. Thyroid: No goiter or nodules appreciated.   Lungs: Clear with good BS bilat with no rales, rhonchi, or wheezes  Heart: Auscultation: RRR.  Abdomen: Normoactive bowel sounds, soft, nontender, without masses or organomegaly palpable  Extremities:  BL LE: No pretibial edema normal ROM and strength.  Skin: Hair: Texture and amount normal with gender appropriate distribution Skin Inspection: No rashes Skin Palpation: Skin temperature, texture, and thickness normal to palpation  Neuro: Cranial nerves: II - XII grossly intact  Motor: Normal strength throughout DTRs: 2+ and symmetric in UE without delay in relaxation phase  Mental Status: Judgment, insight: Intact Mood and affect: No depression, anxiety, or agitation     DATA REVIEWED: Results for Brett, Campbell (MRN [id number]) as of 11/22/2020 12:33  Ref. Range 11/22/2020 09:33  Sodium Latest Ref Range: 135 - 145 mEq/L 138  Potassium Latest Ref Range: 3.5 - 5.1 mEq/L 4.1  Chloride Latest Ref Range: 96 - 112 mEq/L 103  CO2 Latest Ref Range: 19 - 32 mEq/L 23  Glucose Latest Ref Range: 70 - 99 mg/dL 91  BUN Latest Ref Range: 6 - 23 mg/dL 20  Creatinine Latest Ref Range: 0.40 - 1.50 mg/dL 4.74  Calcium Latest Ref Range: 8.4 - 10.5 mg/dL 7.5 (L)  Alkaline Phosphatase Latest Ref Range: 39 - 117 U/L 54  Albumin Latest Ref Range: 3.5 - 5.2 g/dL 4.4  AST Latest  Ref Range: 0 - 37 U/L 23  ALT Latest Ref Range: 0 - 53 U/L 26  Total Protein Latest Ref Range: 6.0 - 8.3 g/dL 7.4  Total Bilirubin Latest Ref Range: 0.2 - 1.2 mg/dL 0.6  GFR Latest Ref Range: >60.00 mL/min 81.81  VITD Latest Ref Range: 30.00 - 100.00 ng/mL 20.23 (L)  TSH Latest Ref Range: 0.35 - 4.50 uIU/mL 1.34      ASSESSMENT/PLAN/RECOMMENDATIONS:   Hypoparathyroidism  The goals of therapy in  patients with hypoparathyroidism are to relieve symptoms, to raise and maintain the serum calcium concentration in the low-normal range( 8.0 to 9 mg/dL), and to prevent iatrogenic development of kidney stones. Attainment of higher serum calcium values is not necessary and is usually limited by the development of hypercalciuria due to the loss of renal calcium-retaining effects of parathyroid hormone . For adults with stable chronic hypoparathyroidism, the dose of oral calcium is typically 1 to 2 g of elemental calcium daily, in divided doses.   Calcitriol is the vitamin D metabolite of choice because it does not require renal activation, it has a rapid onset of action (hours), and a shorter half-life.  The major side effects of calcium and vitamin D replacement in patients with hypoparathyroidism are hypercalcemia and hypercalciuria, which, if chronic, can cause nephrolithiasis, nephrocalcinosis, and renal failure. Hypercalciuria is the earliest sign of toxicity and can develop in the absence of hypercalcemia.   To prevent these complications, urinary calcium excretion should be measured periodically and the dose of calcium and vitamin D reduced if it is elevated (=300 mg in 24 hours).  - Corrected calcium today is 7.18 mg/dL   Medications : - STOP TUMS - START Caltrate 600 mg, 2 tablets with Lunch and 2 tablet with Supper - Continue Calcitriol 0.5 mcg two tablets a day     2. Post Surgical Hypothyroidism:  - No local neck symptoms - History of non compliance with LT- 4 replacement,  discussed importance of compliance and risk of myxedema coma if not taken . - Pt educated extensively on the correct way to take levothyroxine (first thing in the morning with water, 30 minutes before eating or taking other medications). - Pt encouraged to double dose the following day if she were to miss a dose given long half-life of levothyroxine.   Medications Continue Levothyroxine 200 mcg daily  Continue Levothyroxine 50 mcg daily    3. Vitamin D Insufficiency:  - Will start OTC Vitamin D3 1000 iu daily    Labs in 6 weeks   F/U in 3 months   Signed electronically by: Lyndle Herrlich, MD  [facility] Endocrinology  [facility] [address] [place] [place], [place] [address] Phone: [phone] FAX: [phone]   CC: Brett Bosch, NP [address] [place] [address] Phone: [phone] Fax: [phone]   Return to Endocrinology clinic as below: Future Appointments  Date Time Provider Department Center  01/03/2021  8:30 AM [facility] None  02/21/2021  8:30 AM Rebakah Cokley, Konrad Dolores, MD [facility] None

## 2020-11-22 NOTE — Patient Instructions (Signed)
-   STOP TUMS - START Caltrate 600 mg, 2 tablets with Lunch and 2 tablet with Supper - Continue Calcitriol 0.5 mcg two tablet a day for now, but will adjust this after the labs

## 2020-11-25 ENCOUNTER — Telehealth: Payer: Self-pay | Admitting: Internal Medicine

## 2020-11-25 NOTE — Telephone Encounter (Signed)
-   PLease let the sister Brett Campbell know that his Vitamin D is low, Over the counter D3 1000 iu daily needs to be started    - his calcium is still low but I will continue the recommendations from the Visit  caltrate 600 mg, TWO tablets with Lunch and TWO tablet supper  Continue Calcitriol 2 capsules daily      Thanks  Abby Nena Jordan, MD  [facility] Endocrinology  [facility] [address] [place], [place] [address] Phone: [phone] FAX: [phone]

## 2020-11-25 NOTE — Telephone Encounter (Signed)
Spoken to patient's sister [name]) and notified Dr Quin Hoop comments. Verbalized understanding.

## 2020-12-03 ENCOUNTER — Telehealth: Payer: Self-pay | Admitting: Genetic Counselor

## 2020-12-03 NOTE — Telephone Encounter (Signed)
Contacted Brett Campbell's niece, [name], to inform her that [employer] communicated no OOP cost for genetic testing.  LVM asking for a return call to set up a lab appointment for blood draw.

## 2020-12-09 ENCOUNTER — Encounter: Payer: Self-pay | Admitting: Gastroenterology

## 2020-12-26 NOTE — Telephone Encounter (Signed)
Communicated with Brett Campbell niece, Brett Campbell, that [employer] estimated no OOP cost for genetic testing.  Brett Campbell said she would call back later today with a good time for a blood draw for Brett Campbell.

## 2021-01-03 ENCOUNTER — Other Ambulatory Visit: Payer: Self-pay

## 2021-01-03 ENCOUNTER — Other Ambulatory Visit (INDEPENDENT_AMBULATORY_CARE_PROVIDER_SITE_OTHER): Payer: Medicare HMO

## 2021-01-03 ENCOUNTER — Telehealth: Payer: Self-pay | Admitting: Internal Medicine

## 2021-01-03 DIAGNOSIS — E208 Other hypoparathyroidism: Secondary | ICD-10-CM

## 2021-01-03 DIAGNOSIS — E2089 Other specified hypoparathyroidism: Secondary | ICD-10-CM

## 2021-01-03 DIAGNOSIS — E890 Postprocedural hypothyroidism: Secondary | ICD-10-CM

## 2021-01-03 LAB — TSH: TSH: 0.04 u[IU]/mL — ABNORMAL LOW (ref 0.35–4.50)

## 2021-01-03 LAB — ALBUMIN: Albumin: 4.1 g/dL (ref 3.5–5.2)

## 2021-01-03 LAB — CALCIUM: Calcium: 8.5 mg/dL (ref 8.4–10.5)

## 2021-01-03 NOTE — Telephone Encounter (Signed)
Can you please let the sister [name] know that Mr. Blakely's thyroid tests came back showing that he is on way too much levothyroxine   Will need to reduce the dose as below, he is on 2 different strengths of tablets   Continue levothyroxine 200 MCG daily But Decrease levothyroxine 50 MCG to half a tablet daily   So he is going to end up taking 225 MCG's daily   Thanks Abby Nena Jordan, MD  [facility] Endocrinology  [facility] [address] [place], [place] [address] Phone: [phone] FAX: [phone]

## 2021-01-03 NOTE — Telephone Encounter (Signed)
Spoken to patient's sister and notified Dr Quin Hoop comments. Verbalized understanding.

## 2021-01-13 DIAGNOSIS — K6389 Other specified diseases of intestine: Secondary | ICD-10-CM | POA: Diagnosis not present

## 2021-01-28 ENCOUNTER — Telehealth: Payer: Self-pay | Admitting: Genetic Counselor

## 2021-01-28 NOTE — Telephone Encounter (Signed)
Scheduled appt per 6/1 referral. Spoke to pt's niece who is aware of appt.

## 2021-02-03 ENCOUNTER — Telehealth: Payer: Self-pay | Admitting: Genetic Counselor

## 2021-02-03 NOTE — Telephone Encounter (Signed)
Called Mr. Schlotzhauer's niece, [name], to inform her that genetics appointment on 6/23 is not needed given that he had a genetic counseling appointment in March; however, lab appointment needs to be scheduled if Mr. Gouge wishes to have genetic testing.  Lab appointment scheduled for tomorrow 6/14 at 9:30am

## 2021-02-04 ENCOUNTER — Inpatient Hospital Stay: Payer: Medicare HMO | Attending: Genetic Counselor

## 2021-02-04 ENCOUNTER — Other Ambulatory Visit: Payer: Self-pay

## 2021-02-04 LAB — GENETIC SCREENING ORDER

## 2021-02-11 ENCOUNTER — Other Ambulatory Visit (HOSPITAL_COMMUNITY): Payer: Self-pay

## 2021-02-12 DIAGNOSIS — N189 Chronic kidney disease, unspecified: Secondary | ICD-10-CM | POA: Diagnosis not present

## 2021-02-12 DIAGNOSIS — I10 Essential (primary) hypertension: Secondary | ICD-10-CM | POA: Diagnosis not present

## 2021-02-12 DIAGNOSIS — E785 Hyperlipidemia, unspecified: Secondary | ICD-10-CM | POA: Diagnosis not present

## 2021-02-12 DIAGNOSIS — D649 Anemia, unspecified: Secondary | ICD-10-CM | POA: Diagnosis not present

## 2021-02-12 DIAGNOSIS — E039 Hypothyroidism, unspecified: Secondary | ICD-10-CM | POA: Diagnosis not present

## 2021-02-12 DIAGNOSIS — J449 Chronic obstructive pulmonary disease, unspecified: Secondary | ICD-10-CM | POA: Diagnosis not present

## 2021-02-12 DIAGNOSIS — E559 Vitamin D deficiency, unspecified: Secondary | ICD-10-CM | POA: Diagnosis not present

## 2021-02-13 ENCOUNTER — Other Ambulatory Visit: Payer: Medicare HMO

## 2021-02-13 ENCOUNTER — Encounter: Payer: Medicare HMO | Admitting: Genetic Counselor

## 2021-02-14 ENCOUNTER — Telehealth: Payer: Self-pay | Admitting: Genetic Counselor

## 2021-02-14 ENCOUNTER — Encounter: Payer: Self-pay | Admitting: Genetic Counselor

## 2021-02-14 ENCOUNTER — Ambulatory Visit: Payer: Self-pay | Admitting: Genetic Counselor

## 2021-02-14 DIAGNOSIS — Z1379 Encounter for other screening for genetic and chromosomal anomalies: Secondary | ICD-10-CM

## 2021-02-14 DIAGNOSIS — Z86010 Personal history of colonic polyps: Secondary | ICD-10-CM

## 2021-02-14 DIAGNOSIS — Z7189 Other specified counseling: Secondary | ICD-10-CM | POA: Insufficient documentation

## 2021-02-14 NOTE — Progress Notes (Signed)
HPI:  [name] was previously seen in the [facility] due to a personal history of colon polyps and concerns regarding a hereditary predisposition to cancer. Please refer to our prior cancer genetics clinic note for more information regarding our discussion, assessment and recommendations, at the time. Brett Campbell recent genetic test results were disclosed to his niece, Brett Campbell, per his request, as were recommendations warranted by these results. These results and recommendations are discussed in more detail below.  CANCER HISTORY:  In February 2022, at the age of 60, 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  No high grade dysplasia was detected. [name] reported that this was his first colonoscopy.    FAMILY HISTORY:  We obtained a detailed, 4-generation family history.  Significant diagnoses are listed below: Family History  Problem Relation Age of Onset   CAD Mother    Diabetes Mellitus II Mother    Diabetes Mother    CAD Father    Heart attack Father    Heart disease Father    Diabetes Sister    Colon cancer Neg Hx    Esophageal cancer Neg Hx    Stomach cancer Neg Hx    Colon polyps Neg Hx     Brett Campbell has no children two maternal half sisters.  His mother passed away after the age of 61, and his father passed away at age 24.  No family history of cancer or colon polyps were reported.   [name] is unaware of previous family history of genetic testing for hereditary cancer risks. There is no reported Ashkenazi Jewish ancestry. There is no known consanguinity.  GENETIC TEST RESULTS: Genetic testing reported out on February 13, 2021.  The [employer] CustomNext-Cancer +RNAinsight Panel found no pathogenic mutations. The CustomNext-Cancer+RNAinsight panel offered by Brett Campbell includes sequencing and rearrangement analysis for the following 47 genes:  APC, ATM, AXIN2, BARD1, BMPR1A, BRCA1, BRCA2, BRIP1,  CDH1, CDK4, CDKN2A, CHEK2, DICER1, EPCAM, GREM1, HOXB13, MEN1, MLH1, MSH2, MSH3, MSH6, MUTYH, NBN, NF1, NF2, NTHL1, PALB2, PMS2, POLD1, POLE, PTEN, RAD51C, RAD51D, RECQL, RET, SDHA, SDHAF2, SDHB, SDHC, SDHD, SMAD4, SMARCA4, STK11, TP53, TSC1, TSC2, and VHL.  RNA data is routinely analyzed for use in variant interpretation for all genes.  The test report has been scanned into EPIC and is located under the Molecular Pathology section of the Results Review tab.  A portion of the result report is included below for reference.     We discussed with [name] that because current genetic testing is not perfect, it is possible there may be a gene mutation in one of these genes that current testing cannot detect, but that chance is small.  We also discussed, that there could be another gene that has not yet been discovered, or that we have not yet tested, that is responsible for his personal history of polyps. It is also possible there is a hereditary cause for the cancer in the family that Brett Campbell did not inherit and therefore was not identified in his testing.  Therefore, it is important to remain in touch with cancer genetics in the future so that we can continue to offer Brett Campbell the most up to date genetic testing.   ADDITIONAL GENETIC TESTING: We discussed with [name] that there are other genes that are associated with increased cancer risk that can be analyzed. Should Brett Campbell wish to pursue additional genetic testing, we are happy to discuss and coordinate this testing,  at any time.     CANCER SCREENING RECOMMENDATIONS: Brett Campbell test result is considered negative (normal).  This means that we have not identified a hereditary cause for his personal history of polyps at this time.   This negative genetic test simply tells [place] that we cannot yet define why Brett Campbell has had  an increased number of colorectal polyps.  Mr. [facility] medical management and  screening should be based on the prospect that he  will likely form more colon polyps should therefore follow screening and/or surgical recommendations from his gastroenterologists.  We also recommended that Brett Campbell have an upper endoscopy periodically.  RECOMMENDATIONS FOR FAMILY MEMBERS:  Individuals in this family might be at some increased risk of developing cancer, over the general population risk, simply due to the family history of cancer.  We recommended women in this family have a yearly mammogram beginning at age 75, or 6 years younger than the earliest onset of cancer, an annual clinical breast exam, and perform monthly breast self-exams. Women in this family should also have a gynecological exam as recommended by their primary provider. Family members should be referred for colonoscopy by age 35, or earlier, as recommended by their physicians.   FOLLOW-UP: Lastly, we discussed with [name] that cancer genetics is a rapidly advancing field and it is possible that new genetic tests will be appropriate for him and/or his family members in the future. We encouraged him to remain in contact with cancer genetics on an annual basis so we can update his personal and family histories and let him know of advances in cancer genetics that may benefit this family.   Our contact number was provided. Brett Campbell questions were answered to his satisfaction, and he knows he is welcome to call us at anytime with additional questions or concerns.     Brett Campbell M. Brett Campbell Catching, [place], Brett Campbell Genetic Counselor [email or web address] (P) [phone]

## 2021-02-14 NOTE — Telephone Encounter (Signed)
Discussed negative genetic testing results with niece, [name], per the request of Mr. Bucker.   Discussed that we do not know why he has colon polyps at this point in time.  He should continue to follow the recommendations of his physicians.  It will be important for him to keep in touch with [place] in genetics to see if additional testing is needed throughout time.

## 2021-02-14 NOTE — Progress Notes (Signed)
Pt. Needs orders for upcoming surgery.PAT and labs on 02/17/21.

## 2021-02-14 NOTE — Patient Instructions (Addendum)
DUE TO COVID-19 ONLY ONE VISITOR IS ALLOWED TO COME WITH YOU AND STAY IN THE WAITING ROOM ONLY DURING PRE OP AND PROCEDURE DAY OF SURGERY. THE 1 VISITOR  MAY VISIT WITH YOU AFTER SURGERY IN YOUR PRIVATE ROOM DURING VISITING HOURS ONLY!  YOU NEED TO HAVE A COVID 19 TEST ON: 02/25/21 @  8:30 AM , THIS TEST MUST BE DONE BEFORE SURGERY,  COVID TESTING SITE [place] [id number], IT IS ON THE RIGHT GOING OUT [address] APPROXIMATELY  2 MINUTES PAST [employer] ON THE RIGHT. ONCE YOUR COVID TEST IS COMPLETED,  PLEASE BEGIN THE QUARANTINE INSTRUCTIONS AS OUTLINED IN YOUR HANDOUT.               Brett Campbell   Your procedure is scheduled on: 02/26/21   Report to [facility] Main  Entrance   Report to admitting at: 8:30 AM     Call this number if you have problems the morning of surgery [phone]    Remember: Do not eat solid food :After Midnight. Clear liquids until: 7:30 AM  CLEAR LIQUID DIET  Foods Allowed                                                                     Foods Excluded  Coffee and tea, regular and decaf                             liquids that you cannot  Plain Jell-O any favor except red or purple                                           see through such as: Fruit ices (not with fruit pulp)                                     milk, soups, orange juice  Iced Popsicles                                    All solid food Carbonated beverages, regular and diet                                    Cranberry, grape and apple juices Sports drinks like Gatorade Lightly seasoned clear broth or consume(fat free) Sugar, honey syrup  Sample Menu Breakfast                                Lunch                                     Supper Cranberry juice  Beef broth                            Chicken broth Jell-O                                     Grape juice                           Apple juice Coffee or tea                         Jell-O                                      Popsicle                                                Coffee or tea                        Coffee or tea  _____________________________________________________________________  BRUSH YOUR TEETH MORNING OF SURGERY AND RINSE YOUR MOUTH OUT, NO CHEWING GUM CANDY OR MINTS.    Take these medicines the morning of surgery with A SIP OF WATER: amlodipine,synthroid.                               You may not have any metal on your body including hair pins and              piercings  Do not wear jewelry,lotions, powders or perfumes, deodorant             Men may shave face and neck.   Do not bring valuables to the hospital. [place].  Contacts, dentures or bridgework may not be worn into surgery.  Leave suitcase in the car. After surgery it may be brought to your room.     Patients discharged the day of surgery will not be allowed to drive home. IF YOU ARE HAVING SURGERY AND GOING HOME THE SAME DAY, YOU MUST HAVE AN ADULT TO DRIVE YOU HOME AND BE WITH YOU FOR 24 HOURS. YOU MAY GO HOME BY TAXI OR UBER OR ORTHERWISE, BUT AN ADULT MUST ACCOMPANY YOU HOME AND STAY WITH YOU FOR 24 HOURS.  Name and phone number of your driver:  Special Instructions: N/A              Please read over the following fact sheets you were given: _____________________________________________________________________           [facility] - Preparing for Surgery Before surgery, you can play an important role.  Because skin is not sterile, your skin needs to be as free of germs as possible.  You can reduce the number of germs on your skin by washing with CHG (chlorahexidine gluconate) soap before surgery.  CHG is an antiseptic cleaner which kills germs and bonds with the skin to continue killing germs even  after washing. Please DO NOT use if you have an allergy to CHG or antibacterial soaps.  If your skin becomes  reddened/irritated stop using the CHG and inform your nurse when you arrive at Short Stay. Do not shave (including legs and underarms) for at least 48 hours prior to the first CHG shower.  You may shave your face/neck. Please follow these instructions carefully:  1.  Shower with CHG Soap the night before surgery and the  morning of Surgery.  2.  If you choose to wash your hair, wash your hair first as usual with your  normal  shampoo.  3.  After you shampoo, rinse your hair and body thoroughly to remove the  shampoo.                           4.  Use CHG as you would any other liquid soap.  You can apply chg directly  to the skin and wash                       Gently with a scrungie or clean washcloth.  5.  Apply the CHG Soap to your body ONLY FROM THE NECK DOWN.   Do not use on face/ open                           Wound or open sores. Avoid contact with eyes, ears mouth and genitals (private parts).                       Wash face,  Genitals (private parts) with your normal soap.             6.  Wash thoroughly, paying special attention to the area where your surgery  will be performed.  7.  Thoroughly rinse your body with warm water from the neck down.  8.  DO NOT shower/wash with your normal soap after using and rinsing off  the CHG Soap.                9.  Pat yourself dry with a clean towel.            10.  Wear clean pajamas.            11.  Place clean sheets on your bed the night of your first shower and do not  sleep with pets. Day of Surgery : Do not apply any lotions/deodorants the morning of surgery.  Please wear clean clothes to the hospital/surgery center.  FAILURE TO FOLLOW THESE INSTRUCTIONS MAY RESULT IN THE CANCELLATION OF YOUR SURGERY PATIENT SIGNATURE_________________________________  NURSE SIGNATURE__________________________________  ________________________________________________________________________

## 2021-02-17 ENCOUNTER — Ambulatory Visit: Payer: Self-pay | Admitting: Surgery

## 2021-02-17 ENCOUNTER — Encounter (HOSPITAL_COMMUNITY)
Admission: RE | Admit: 2021-02-17 | Discharge: 2021-02-17 | Disposition: A | Discharge status: Home / Self Care | Payer: Medicare HMO | Source: Ambulatory Visit | Attending: Surgery | Admitting: Surgery

## 2021-02-17 ENCOUNTER — Other Ambulatory Visit: Payer: Self-pay

## 2021-02-17 ENCOUNTER — Encounter (HOSPITAL_COMMUNITY): Payer: Self-pay

## 2021-02-17 DIAGNOSIS — Z01818 Encounter for other preprocedural examination: Secondary | ICD-10-CM | POA: Diagnosis not present

## 2021-02-17 HISTORY — DX: Hypothyroidism, unspecified: E03.9

## 2021-02-17 LAB — BASIC METABOLIC PANEL
Anion gap: 11 (ref 5–15)
BUN: 24 mg/dL — ABNORMAL HIGH (ref 6–20)
CO2: 24 mmol/L (ref 22–32)
Calcium: 9.3 mg/dL (ref 8.9–10.3)
Chloride: 105 mmol/L (ref 98–111)
Creatinine, Ser: 1.18 mg/dL (ref 0.61–1.24)
GFR, Estimated: >60 mL/min (ref >60)
Glucose, Bld: 81 mg/dL (ref 70–99)
Potassium: 4.1 mmol/L (ref 3.5–5.1)
Sodium: 140 mmol/L (ref 135–145)

## 2021-02-17 LAB — CBC
HCT: 44.5 % (ref 39.0–52.0)
Hemoglobin: 15 g/dL (ref 13.0–17.0)
MCH: 30.5 pg (ref 26.0–34.0)
MCHC: 33.7 g/dL (ref 30.0–36.0)
MCV: 90.6 fL (ref 80.0–100.0)
Platelets: 224 10*3/uL (ref 150–400)
RBC: 4.91 MIL/uL (ref 4.22–5.81)
RDW: 11.6 % (ref 11.5–15.5)
WBC: 13.2 10*3/uL — ABNORMAL HIGH (ref 4.0–10.5)
nRBC: 0 % (ref 0.0–0.2)

## 2021-02-17 NOTE — Progress Notes (Signed)
COVID Vaccine Completed: NO Date COVID Vaccine completed: COVID vaccine manufacturer: [place]   PCP - Ferd Hibbs: NP Cardiologist -   Chest x-ray -  EKG -  Stress Test -  ECHO -  Cardiac Cath -  Pacemaker/ICD device last checked:  Sleep Study - Yes CPAP - No  Fasting Blood Sugar -  Checks Blood Sugar _____ times a day  Blood Thinner Instructions: Aspirin Instructions: Last Dose:  Anesthesia review: Hx: OSA(NO CPAP),COPD,HTN,Smoker  Patient denies shortness of breath, fever, cough and chest pain at PAT appointment   Patient verbalized understanding of instructions that were given to them at the PAT appointment. Patient was also instructed that they will need to review over the PAT instructions again at home before surgery.

## 2021-02-17 NOTE — H&P (Signed)
CC: Referred by Dr. Tarri Glenn for endoscopically unresectable polyp at ileocecal valve  HPI: [name] is a very pleasant 60yoM with hx of deafness, HTN, HLD, hypothyroidism who underwent colonoscopy with Dr. Tarri Glenn 10/21/20 which was done for heme positive stool.  He reportedly had a colonoscopy 5-6 years ago in [place] that was "okay".  He was found to have numerous polyps throughout his colon 1-8 mm in size that were removed.  He also had a 15 mm polyp in the descending colon that was removed.  There was a large carpeting multilobulated and ulcerated polyp in the cecum.  This extends over the ileocecal valve fold to the appendiceal orifice.  Biopsies were taken and the area was tattooed.  It was not endoscopically amenable to resection. Pathology returned with multiple fragments of tubular adenoma from the cecal biopsy.  The other polyps returned as tubular adenomas without dysplasia or malignancy.  There are also some hyperplastic polyps removed.  He was referred to our office after undergoing CT abdomen and pelvis.  CT AP 11/01/20 - 4.9 cm polypoid mass in the cecum.  2.9 cm infrarenal aortic aneurysm.  PMH: deafness, HTN, HLD, hypothyroidism  PSH: Lap chole many years ago; thyroidectomy at age 6 for a goiter  FHx: Denies FHx of colorectal, breast, endometrial, ovarian or cervical cancer  Social: He reports he drinks one beer per day and smokes one cigarette per day.  Occasional marijuana use.  He is not currently working.  He is here today with his sister who lives with him and  whom he has requested be present to assist in translation; he did not want a 'formal' interpreter present  ROS: A comprehensive 10 system review of systems was completed with the patient and pertinent findings as noted above.  The patient is a 60 year old male.   Past Surgical History Altamese Cabal, [place]; 01/13/2021 3:03 PM) Colon Polyp Removal - Colonoscopy   Gallbladder Surgery - Laparoscopic   Thyroid  Surgery    Diagnostic Studies History Altamese Cabal, CMA; 01/13/2021 3:03 PM) Colonoscopy   within last year  Allergies Altamese Cabal, [place]; 01/13/2021 3:04 PM) Penicillin G Sodium *PENICILLINS*    Medication History Altamese Cabal, CMA; 01/13/2021 3:05 PM) HYDROcodone-Acetaminophen  (5-325MG  Tablet, Oral) Active. Lisinopril  (10MG  Tablet, Oral) Active. Norvasc  (10MG  Tablet, Oral) Active. Levothyroxine Sodium  (25MCG Tablet, Oral) Active.  Social History Altamese Cabal, [place]; 01/13/2021 3:03 PM) Alcohol use   Occasional alcohol use. Caffeine use   Coffee. Illicit drug use   Uses socially only. Tobacco use   Current some day smoker.  Family History Altamese Cabal, [place]; 01/13/2021 3:03 PM) Diabetes Mellitus   Mother. Heart Disease   Father, Mother. Heart disease in male family member before age 50   Heart disease in male family member before age 73   Respiratory Condition   Mother.  Other Problems Altamese Cabal, [place]; 01/13/2021 3:03 PM) Back Pain   Thyroid Disease      Review of Systems Altamese Cabal CMA; 01/13/2021 3:03 PM) General Not Present- Appetite Loss, Chills, Fatigue, Fever, Night Sweats, Weight Gain and Weight Loss. Skin Present- Dryness. Not Present- Change in Wart/Mole, Hives, Jaundice, New Lesions, Non-Healing Wounds, Rash and Ulcer. HEENT Present- Hearing Loss, Seasonal Allergies and Wears glasses/contact lenses. Not Present- Earache, Hoarseness, Nose Bleed, Oral Ulcers, Ringing in the Ears, Sinus Pain, Sore Throat, Visual Disturbances and Yellow Eyes. Respiratory Present- Snoring. Not Present- Bloody sputum, Chronic Cough, Difficulty Breathing and Wheezing. Breast Not Present- Breast Mass,  Breast Pain, Nipple Discharge and Skin Changes. Cardiovascular Not Present- Chest Pain, Difficulty Breathing Lying Down, Leg Cramps, Palpitations, Rapid Heart Rate, Shortness of Breath and Swelling of Extremities. Gastrointestinal Not Present- Abdominal Pain, Bloating, Bloody  Stool, Change in Bowel Habits, Chronic diarrhea, Constipation, Difficulty Swallowing, Excessive gas, Gets full quickly at meals, Hemorrhoids, Indigestion, Nausea, Rectal Pain and Vomiting. Male Genitourinary Not Present- Blood in Urine, Change in Urinary Stream, Frequency, Impotence, Nocturia, Painful Urination, Urgency and Urine Leakage. Musculoskeletal Not Present- Back Pain, Joint Pain, Joint Stiffness, Muscle Pain, Muscle Weakness and Swelling of Extremities. Neurological Not Present- Decreased Memory, Fainting, Headaches, Numbness, Seizures, Tingling, Tremor, Trouble walking and Weakness. Psychiatric Not Present- Anxiety, Bipolar, Change in Sleep Pattern, Depression, Fearful and Frequent crying. Endocrine Not Present- Cold Intolerance, Excessive Hunger, Hair Changes, Heat Intolerance, Hot flashes and New Diabetes. Hematology Not Present- Blood Thinners, Easy Bruising, Excessive bleeding, Gland problems, HIV and Persistent Infections.  Vitals Altamese Cabal CMA; 01/13/2021 3:05 PM) 01/13/2021 3:05 PM Weight: 186 lb   Temp.: 96.5 F    Pulse: 94 (Regular)    BP: 130/68(Sitting, Left Arm, Standard)       Physical Exam Harrell Gave M. Galdino Hinchman MD; 01/13/2021 3:23 PM) The physical exam findings are as follows: Note:  Constitutional: No acute distress; conversant; wearing mask Eyes: Moist conjunctiva; no lid lag; anicteric sclerae; pupils equal and round Lungs: Normal respiratory effort; no tactile fremitus CV: rrr; no palpable thrill; no pitting edema GI: Abdomen soft, nontender, nondistended; no palpable hepatosplenomegaly MSK: Normal gait; no clubbing/cyanosis Psychiatric: Appropriate affect; alert and oriented 3 Lymphatic: No palpable cervical or axillary lymphadenopathy    Assessment & Plan [place]rell Gave M. Liara Holm MD; 01/13/2021 3:26 PM) MASS OF COLON (K63.89) Story: [name] is a very pleasant 60yoM with hx deafness, HTN, HLD, hypothyroidism here for evaluation of an  endoscopically unresectable colon polyp that is roughly 4.9 cm in size originating in the cecum. Biopsies thus far showed tubular adenoma. Tattoo'd; CT confirmed as well  CT AP 10/2020 - no evidence of metastatic disease; 2.9 cm infrarenal aortic aneurysm  -Genetics referral given number of polyps removed (>10) -The anatomy and physiology of the GI tract was discussed with the patient. The pathophysiology of colon polyps and cancer was discussed with associated pictures. -We have discussed options going forward including further observation which may allow this to progress/spread as well as surgery. We discussed laparoscopic right hemicolectomy. -The planned procedure, material risks (including, but not limited to, pain, bleeding, infection, scarring, need for blood transfusion, damage to surrounding structures- blood vessels/nerves/viscus/organs, damage to ureter, urine leak, leak from anastomosis, need for additional procedures, worsening of pre-existing medical conditions, need for stoma which could be permanent, hernia, recurrence, DVT/PE, pneumonia, heart attack, stroke, death) benefits and alternatives to surgery were discussed at length. The patient's and his sister's questions were answered to their satisfaction, they voiced understanding and elected to proceed with surgery. Additionally, we discussed typical postoperative expectations and the recovery process.   Signed by Ileana Roup, MD

## 2021-02-21 ENCOUNTER — Other Ambulatory Visit: Payer: Self-pay

## 2021-02-21 ENCOUNTER — Ambulatory Visit (INDEPENDENT_AMBULATORY_CARE_PROVIDER_SITE_OTHER): Payer: Medicare HMO | Admitting: Internal Medicine

## 2021-02-21 ENCOUNTER — Encounter: Payer: Self-pay | Admitting: Internal Medicine

## 2021-02-21 VITALS — BP 124/78 | HR 82 | Ht 70.0 in | Wt 186.0 lb

## 2021-02-21 DIAGNOSIS — E559 Vitamin D deficiency, unspecified: Secondary | ICD-10-CM | POA: Diagnosis not present

## 2021-02-21 DIAGNOSIS — E208 Other hypoparathyroidism: Secondary | ICD-10-CM

## 2021-02-21 DIAGNOSIS — E890 Postprocedural hypothyroidism: Secondary | ICD-10-CM | POA: Diagnosis not present

## 2021-02-21 NOTE — Patient Instructions (Signed)
-   Continue Caltrate 600 mg, 2 tablets with Lunch and 2 tablet with Supper - Continue Calcitriol 0.5 mcg two tablet a day  - Decrease Levothyroxine 200 mg , 1 tablet daily

## 2021-02-21 NOTE — Progress Notes (Signed)
Name: Brett Campbell  MRN/ DOB: [id number], Jan 17, 1961    Age/ Sex: 60 y.o., male     PCP: Ferd Hibbs, NP   Reason for Endocrinology Evaluation: Hypocalcemia and Hypothyroidism     Initial Endocrinology Clinic Visit: 11/22/2020    PATIENT IDENTIFIER: 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  He has followed with [facility] since 11/22/2020 for consultative assistance with management of his Hypothyroidism/Hypocalcemia.   HISTORICAL SUMMARY:  Moved from [place]    He is accompanied by his sister [name] and sign language interpreter [place] with sister  HYPOCALCEMIA HISTORY  He was noted with hypocalcemia  Many years ago. Pt is a poor historian and most of the history was obtained from the sister.  Pt is not aware of history of hypocalcemia Per sister he has been diagnosed with this many years ago . He is on 2 Tums TID , and calcitriol 2 a day. NO D3 prescription Has not taken MVI in a couple of weeks   Denies radiation exposure       HYPOTHYROID HISTORY:   S/P total thyroidectomy at age 61 due to goiter . Has been on LT-4 replacement since then. Has history of non compliance requiring hospitalization. Since he has been living with the sister, compliance has improved. He is using a pill box.   Unknown FH of thyroid disease  SUBJECTIVE:    Today (02/21/2021):  [name] is here for Hypocalcemia and Hypothyroidism  Weight has increased   Pending colon sx   Denies fatigue  Denies constipation  Denies perioral tingling/numbness  Denies cramps      HOME ENDOCRINE MEDICATIONS Calcitriol 0.5 mcg BID Caltrate 600 mg 2 tabs BID  Levothyroxine 200 mcg daily PLUS  Levothyroxine 50 mcg , Half daily  Vitamin D 1000 iu daily      HISTORY:  Past Medical History:  Past Medical History:  Diagnosis Date   Calcium deficiency     Cataract    COPD (chronic obstructive pulmonary disease) ([place])    Deaf    Hypertension    Hypothyroidism    Personal history of colonic polyps 11/03/2020   Scarlet fever    Sleep apnea    no longer uses CPAP   Thyroid disease    Tuberculosis    [place] Health department is contact with him due to hx.   Past Surgical History:  Past Surgical History:  Procedure Laterality Date   CHOLECYSTECTOMY     COLONOSCOPY     +5years   THYROIDECTOMY     Social History:  reports that he has been smoking cigarettes. He has a 11.25 pack-year smoking history. He has never used smokeless tobacco. He reports current alcohol use of about 1.0 - 2.0 standard drink of alcohol per week. He reports current drug use. Drug: Marijuana. Family History:  Family History  Problem Relation Age of Onset   CAD Mother    Diabetes Mellitus II Mother    Diabetes Mother    CAD Father    Heart attack Father    Heart disease Father    Diabetes Sister    Colon cancer Neg Hx    Esophageal cancer Neg Hx    Stomach cancer Neg Hx    Colon polyps Neg Hx      HOME MEDICATIONS: Allergies as of 02/21/2021       Reactions  Penicillins    unknown        Medication List        Accurate as of February 21, 2021  7:21 AM. If you have any questions, ask your nurse or doctor.          amLODipine 5 MG tablet Commonly known as: NORVASC Take 5 mg by mouth daily.   aspirin EC 81 MG tablet Take 81 mg by mouth 2 (two) times a week. Swallow whole.   atorvastatin 10 MG tablet Commonly known as: LIPITOR Take 10 mg by mouth daily.   calcitRIOL 0.25 MCG capsule Commonly known as: ROCALTROL Take 0.5 mcg by mouth daily.   Caltrate 600+D Plus Minerals 600-800 MG-UNIT Chew Chew 1,200 mg by mouth 2 (two) times daily. What changed: how much to take   cephALEXin 500 MG capsule Commonly known as: KEFLEX TAKE 1 CAPSULE (500 MG TOTAL) BY MOUTH 3 (THREE) TIMES DAILY FOR 7 DAYS.   cholecalciferol 25 MCG (1000 UNIT)  tablet Commonly known as: VITAMIN D Take 1 tablet (1,000 Units total) by mouth daily.   ibuprofen 200 MG tablet Commonly known as: ADVIL Take 400-800 mg by mouth every 6 (six) hours as needed for moderate pain.   levothyroxine 200 MCG tablet Commonly known as: SYNTHROID Take 1 tablet (200 mcg total) by mouth daily. What changed: Another medication with the same name was changed. Make sure you understand how and when to take each.   levothyroxine 50 MCG tablet Commonly known as: SYNTHROID Take 1 tablet (50 mcg total) by mouth daily. What changed: how much to take   lisinopril 10 MG tablet Commonly known as: ZESTRIL Take 10 mg by mouth daily.   potassium chloride SA 20 MEQ tablet Commonly known as: KLOR-CON Take 20 mEq by mouth daily.   rifampin 300 MG capsule Commonly known as: RIFADIN Take 300 mg by mouth. Take 2 capsules by mouth once daily.          OBJECTIVE:   PHYSICAL EXAM: VS: BP 124/78   Pulse 82   Ht 5\' 10"  (1.778 m)   Wt 186 lb (84.4 kg)   SpO2 94%   BMI 26.69 kg/m    EXAM: General: Pt appears well and is in NAD  Neck: General: Supple without adenopathy. Thyroid:  No goiter or nodules appreciated.   Lungs: Clear with good BS bilat with no rales, rhonchi, or wheezes  Heart: Auscultation: RRR.  Abdomen: Normoactive bowel sounds, soft, nontender, without masses or organomegaly palpable  Extremities:  BL LE: No pretibial edema normal ROM and strength.     DATA REVIEWED: 02/12/2021  Glucose 101 BUN/CR 21/1.39 GFR 58  NA 139 K4.8 CA 9.2 (previously 6.7) TSH 0.153 Vitamin D44.8 (previously 14.2) Phosphorus 4.2 (previously 5.4) PTH 7   ASSESSMENT / PLAN / RECOMMENDATIONS:   Hypoparathyroidism   The goals of therapy in patients with hypoparathyroidism are to relieve symptoms, to raise and maintain the serum calcium concentration in the low-normal range( 8.0 to 9 mg/dL), and to prevent iatrogenic development of kidney stones. Attainment of  higher serum calcium values is not necessary and is usually limited by the development of hypercalciuria due to the loss of renal calcium-retaining effects of parathyroid hormone .     The major side effects of calcium and vitamin D replacement in patients with hypoparathyroidism are hypercalcemia and hypercalciuria, which, if chronic, can cause nephrolithiasis, nephrocalcinosis, and renal failure. Hypercalciuria is the earliest sign of toxicity and can develop in the absence of  hypercalcemia.   To prevent these complications, urinary calcium excretion should be measured periodically and the dose of calcium and vitamin D reduced if it is elevated (?300 mg in 24 hours).   - This is the first time his calcium is within the normal range, if this remains >9 mg/dL, will consider adjusting calcitriol dose in the future as well as 24-hr urine collection for calcium excretion once his dose is stable    Medications :  - Continue  Caltrate 600 mg, 2 tablets with Lunch and 2 tablet with Supper - Continue Calcitriol 0.5 mcg two tablets a day       2. Post Surgical Hypothyroidism:   - No local neck symptoms - History of non compliance with LT- 4 replacement, Doing better recently  - TSH remains low but improving, will continue to reduce LT-4 replacement.    Medications Continue Levothyroxine 200 mcg daily STOP  Levothyroxine 50 mcg daily     3. Vitamin D Insufficiency:    - This has normalized, no changes to Vitamin D replacement   - Its part of his caltrate    F/U in 4 months     Signed electronically by: Mack Guise, MD  [facility]  [facility] [address], [place] [address] Phone: [phone] FAX: [phone]      CC: Ferd Hibbs, NP [place] [address] Phone: [phone]  Fax: [phone]   Return to Endocrinology clinic as below: Future Appointments  Date Time Provider [place]  02/21/2021  8:30 AM Cherrie Franca, Melanie Crazier, MD [id number] None  02/25/2021  8:40 AM [facility] None

## 2021-02-25 ENCOUNTER — Other Ambulatory Visit (HOSPITAL_COMMUNITY)
Admission: RE | Admit: 2021-02-25 | Discharge: 2021-02-25 | Disposition: A | Discharge status: Home / Self Care | Payer: Medicare HMO | Source: Ambulatory Visit | Attending: Surgery | Admitting: Surgery

## 2021-02-25 MED ORDER — CLINDAMYCIN PHOSPHATE 900 MG/50ML IV SOLN
900.0000 mg | INTRAVENOUS | Status: AC
  Administered 2021-02-26: 900 mg via INTRAVENOUS
  Filled 2021-02-25: qty 50

## 2021-02-25 MED ORDER — GENTAMICIN SULFATE 40 MG/ML IJ SOLN
5.0000 mg/kg | INTRAVENOUS | Status: AC
  Administered 2021-02-26: 420 mg via INTRAVENOUS
  Filled 2021-02-25 (×2): qty 10.5

## 2021-02-25 MED ORDER — BUPIVACAINE LIPOSOME 1.3 % IJ SUSP
20.0000 mL | Freq: Once | INTRAMUSCULAR | Status: DC
  Filled 2021-02-25: qty 20

## 2021-02-26 ENCOUNTER — Inpatient Hospital Stay (HOSPITAL_COMMUNITY)
Admission: RE | Admit: 2021-02-26 | Discharge: 2021-03-02 | DRG: 331 | Disposition: A | Discharge status: Home / Self Care | Payer: Medicare HMO | Attending: Surgery | Admitting: Surgery

## 2021-02-26 ENCOUNTER — Encounter (HOSPITAL_COMMUNITY): Payer: Self-pay | Admitting: Surgery

## 2021-02-26 ENCOUNTER — Inpatient Hospital Stay (HOSPITAL_COMMUNITY): Payer: Medicare HMO | Admitting: Anesthesiology

## 2021-02-26 ENCOUNTER — Other Ambulatory Visit: Payer: Self-pay

## 2021-02-26 ENCOUNTER — Inpatient Hospital Stay (HOSPITAL_COMMUNITY): Payer: Medicare HMO | Admitting: Emergency Medicine

## 2021-02-26 ENCOUNTER — Encounter (HOSPITAL_COMMUNITY)
Admission: RE | Disposition: A | Discharge status: Home / Self Care | Payer: Self-pay | Source: Home / Self Care | Attending: Surgery

## 2021-02-26 DIAGNOSIS — F17210 Nicotine dependence, cigarettes, uncomplicated: Secondary | ICD-10-CM | POA: Diagnosis present

## 2021-02-26 DIAGNOSIS — Z8249 Family history of ischemic heart disease and other diseases of the circulatory system: Secondary | ICD-10-CM

## 2021-02-26 DIAGNOSIS — D122 Benign neoplasm of ascending colon: Secondary | ICD-10-CM | POA: Diagnosis not present

## 2021-02-26 DIAGNOSIS — D120 Benign neoplasm of cecum: Secondary | ICD-10-CM | POA: Diagnosis not present

## 2021-02-26 DIAGNOSIS — Z28310 Unvaccinated for covid-19: Secondary | ICD-10-CM | POA: Diagnosis not present

## 2021-02-26 DIAGNOSIS — H9190 Unspecified hearing loss, unspecified ear: Secondary | ICD-10-CM | POA: Diagnosis present

## 2021-02-26 DIAGNOSIS — Z20822 Contact with and (suspected) exposure to covid-19: Secondary | ICD-10-CM | POA: Diagnosis present

## 2021-02-26 DIAGNOSIS — Z880 Allergy status to penicillin: Secondary | ICD-10-CM | POA: Diagnosis not present

## 2021-02-26 DIAGNOSIS — Z9049 Acquired absence of other specified parts of digestive tract: Secondary | ICD-10-CM

## 2021-02-26 DIAGNOSIS — I10 Essential (primary) hypertension: Secondary | ICD-10-CM | POA: Diagnosis not present

## 2021-02-26 DIAGNOSIS — J449 Chronic obstructive pulmonary disease, unspecified: Secondary | ICD-10-CM | POA: Diagnosis not present

## 2021-02-26 DIAGNOSIS — G4730 Sleep apnea, unspecified: Secondary | ICD-10-CM | POA: Diagnosis present

## 2021-02-26 DIAGNOSIS — E785 Hyperlipidemia, unspecified: Secondary | ICD-10-CM | POA: Diagnosis present

## 2021-02-26 DIAGNOSIS — E890 Postprocedural hypothyroidism: Secondary | ICD-10-CM | POA: Diagnosis present

## 2021-02-26 DIAGNOSIS — K6389 Other specified diseases of intestine: Secondary | ICD-10-CM | POA: Diagnosis not present

## 2021-02-26 DIAGNOSIS — I714 Abdominal aortic aneurysm, without rupture: Secondary | ICD-10-CM | POA: Diagnosis not present

## 2021-02-26 HISTORY — PX: LAPAROSCOPIC RIGHT HEMI COLECTOMY: SHX5926

## 2021-02-26 LAB — SARS CORONAVIRUS 2 BY RT PCR (HOSPITAL ORDER, PERFORMED IN ~~LOC~~ HOSPITAL LAB): SARS Coronavirus 2: NEGATIVE

## 2021-02-26 LAB — CBC WITH DIFFERENTIAL/PLATELET
Abs Immature Granulocytes: 0.07 10*3/uL (ref 0.00–0.07)
Basophils Absolute: 0.1 10*3/uL (ref 0.0–0.1)
Basophils Relative: 1 %
Eosinophils Absolute: 0.3 10*3/uL (ref 0.0–0.5)
Eosinophils Relative: 2 %
HCT: 44.6 % (ref 39.0–52.0)
Hemoglobin: 16.1 g/dL (ref 13.0–17.0)
Immature Granulocytes: 1 %
Lymphocytes Relative: 12 %
Lymphs Abs: 1.7 10*3/uL (ref 0.7–4.0)
MCH: 31.6 pg (ref 26.0–34.0)
MCHC: 36.1 g/dL — ABNORMAL HIGH (ref 30.0–36.0)
MCV: 87.5 fL (ref 80.0–100.0)
Monocytes Absolute: 0.9 10*3/uL (ref 0.1–1.0)
Monocytes Relative: 7 %
Neutro Abs: 10.7 10*3/uL — ABNORMAL HIGH (ref 1.7–7.7)
Neutrophils Relative %: 77 %
Platelets: 232 10*3/uL (ref 150–400)
RBC: 5.1 MIL/uL (ref 4.22–5.81)
RDW: 11.5 % (ref 11.5–15.5)
WBC: 13.8 10*3/uL — ABNORMAL HIGH (ref 4.0–10.5)
nRBC: 0 % (ref 0.0–0.2)

## 2021-02-26 LAB — TYPE AND SCREEN
ABO/RH(D): A NEG
Antibody Screen: NEGATIVE

## 2021-02-26 LAB — PROTIME-INR
INR: 1 (ref 0.8–1.2)
Prothrombin Time: 12.9 seconds (ref 11.4–15.2)

## 2021-02-26 LAB — COMPREHENSIVE METABOLIC PANEL
ALT: 26 U/L (ref 0–44)
AST: 31 U/L (ref 15–41)
Albumin: 4.7 g/dL (ref 3.5–5.0)
Alkaline Phosphatase: 55 U/L (ref 38–126)
Anion gap: 13 (ref 5–15)
BUN: 19 mg/dL (ref 6–20)
CO2: 21 mmol/L — ABNORMAL LOW (ref 22–32)
Calcium: 8 mg/dL — ABNORMAL LOW (ref 8.9–10.3)
Chloride: 104 mmol/L (ref 98–111)
Creatinine, Ser: 1.24 mg/dL (ref 0.61–1.24)
GFR, Estimated: >60 mL/min (ref >60)
Glucose, Bld: 106 mg/dL — ABNORMAL HIGH (ref 70–99)
Potassium: 3.9 mmol/L (ref 3.5–5.1)
Sodium: 138 mmol/L (ref 135–145)
Total Bilirubin: 1 mg/dL (ref 0.3–1.2)
Total Protein: 7.9 g/dL (ref 6.5–8.1)

## 2021-02-26 LAB — ABO/RH: ABO/RH(D): A NEG

## 2021-02-26 LAB — HEMOGLOBIN A1C
Hgb A1c MFr Bld: 5.9 % — ABNORMAL HIGH (ref 4.8–5.6)
Mean Plasma Glucose: 122.63 mg/dL

## 2021-02-26 LAB — APTT: aPTT: 27 seconds (ref 24–36)

## 2021-02-26 SURGERY — LAPAROSCOPIC RIGHT HEMI COLECTOMY
Anesthesia: General | Site: Abdomen

## 2021-02-26 MED ORDER — ALVIMOPAN 12 MG PO CAPS
12.0000 mg | ORAL_CAPSULE | ORAL | Status: AC
  Administered 2021-02-26: 12 mg via ORAL
  Filled 2021-02-26: qty 1

## 2021-02-26 MED ORDER — ACETAMINOPHEN 10 MG/ML IV SOLN: 1000.0000 mg | Freq: Once | INTRAVENOUS | Status: DC | PRN

## 2021-02-26 MED ORDER — KETAMINE HCL 10 MG/ML IJ SOLN
INTRAMUSCULAR | Status: AC
  Filled 2021-02-26: qty 1

## 2021-02-26 MED ORDER — TRAMADOL HCL 50 MG PO TABS
50.0000 mg | ORAL_TABLET | Freq: Four times a day (QID) | ORAL | Status: DC | PRN
  Administered 2021-02-26 – 2021-03-01 (×5): 50 mg via ORAL
  Filled 2021-02-26 (×6): qty 1

## 2021-02-26 MED ORDER — 0.9 % SODIUM CHLORIDE (POUR BTL) OPTIME
TOPICAL | Status: DC | PRN
  Administered 2021-02-26: 2000 mL

## 2021-02-26 MED ORDER — AMLODIPINE BESYLATE 5 MG PO TABS
5.0000 mg | ORAL_TABLET | Freq: Every day | ORAL | Status: DC
  Administered 2021-02-27 – 2021-03-02 (×4): 5 mg via ORAL
  Filled 2021-02-26 (×4): qty 1

## 2021-02-26 MED ORDER — DEXAMETHASONE SODIUM PHOSPHATE 10 MG/ML IJ SOLN
INTRAMUSCULAR | Status: AC
  Filled 2021-02-26: qty 1

## 2021-02-26 MED ORDER — ACETAMINOPHEN 500 MG PO TABS
1000.0000 mg | ORAL_TABLET | ORAL | Status: AC
  Administered 2021-02-26: 1000 mg via ORAL
  Filled 2021-02-26: qty 2

## 2021-02-26 MED ORDER — NEOMYCIN SULFATE 500 MG PO TABS: 1000.0000 mg | ORAL_TABLET | ORAL | Status: DC

## 2021-02-26 MED ORDER — HYDROMORPHONE HCL 1 MG/ML IJ SOLN
INTRAMUSCULAR | Status: AC
  Filled 2021-02-26: qty 1

## 2021-02-26 MED ORDER — LIDOCAINE 20MG/ML (2%) 15 ML SYRINGE OPTIME
INTRAMUSCULAR | Status: DC | PRN
  Administered 2021-02-26: 1.5 mg/kg/h via INTRAVENOUS

## 2021-02-26 MED ORDER — PHENYLEPHRINE 40 MCG/ML (10ML) SYRINGE FOR IV PUSH (FOR BLOOD PRESSURE SUPPORT)
PREFILLED_SYRINGE | INTRAVENOUS | Status: DC | PRN
  Administered 2021-02-26 (×2): 80 ug via INTRAVENOUS

## 2021-02-26 MED ORDER — HEPARIN SODIUM (PORCINE) 5000 UNIT/ML IJ SOLN
5000.0000 [IU] | Freq: Once | INTRAMUSCULAR | Status: AC
  Administered 2021-02-26: 5000 [IU] via SUBCUTANEOUS
  Filled 2021-02-26: qty 1

## 2021-02-26 MED ORDER — HEPARIN SODIUM (PORCINE) 5000 UNIT/ML IJ SOLN
5000.0000 [IU] | Freq: Three times a day (TID) | INTRAMUSCULAR | Status: DC
  Administered 2021-02-27 – 2021-03-02 (×9): 5000 [IU] via SUBCUTANEOUS
  Filled 2021-02-26 (×9): qty 1

## 2021-02-26 MED ORDER — LACTATED RINGERS IV SOLN: INTRAVENOUS | Status: DC

## 2021-02-26 MED ORDER — ACETAMINOPHEN 500 MG PO TABS
1000.0000 mg | ORAL_TABLET | Freq: Four times a day (QID) | ORAL | Status: DC
  Administered 2021-02-26 – 2021-03-02 (×14): 1000 mg via ORAL
  Filled 2021-02-26 (×15): qty 2

## 2021-02-26 MED ORDER — BISACODYL 5 MG PO TBEC: 20.0000 mg | DELAYED_RELEASE_TABLET | Freq: Once | ORAL | Status: DC

## 2021-02-26 MED ORDER — SUGAMMADEX SODIUM 200 MG/2ML IV SOLN
INTRAVENOUS | Status: DC | PRN
  Administered 2021-02-26: 200 mg via INTRAVENOUS

## 2021-02-26 MED ORDER — BUPIVACAINE LIPOSOME 1.3 % IJ SUSP
INTRAMUSCULAR | Status: DC | PRN
  Administered 2021-02-26: 20 mL

## 2021-02-26 MED ORDER — ATORVASTATIN CALCIUM 10 MG PO TABS
10.0000 mg | ORAL_TABLET | Freq: Every evening | ORAL | Status: DC
  Administered 2021-02-27 – 2021-03-01 (×3): 10 mg via ORAL
  Filled 2021-02-26 (×3): qty 1

## 2021-02-26 MED ORDER — PHENYLEPHRINE 40 MCG/ML (10ML) SYRINGE FOR IV PUSH (FOR BLOOD PRESSURE SUPPORT)
PREFILLED_SYRINGE | INTRAVENOUS | Status: AC
  Filled 2021-02-26: qty 10

## 2021-02-26 MED ORDER — ORAL CARE MOUTH RINSE: 15.0000 mL | Freq: Once | OROMUCOSAL | Status: AC

## 2021-02-26 MED ORDER — STERILE WATER FOR IRRIGATION IR SOLN
Status: DC | PRN
  Administered 2021-02-26: 1000 mL

## 2021-02-26 MED ORDER — CHLORHEXIDINE GLUCONATE CLOTH 2 % EX PADS: 6.0000 | MEDICATED_PAD | Freq: Once | CUTANEOUS | Status: DC

## 2021-02-26 MED ORDER — LEVOTHYROXINE SODIUM 100 MCG PO TABS
200.0000 ug | ORAL_TABLET | Freq: Every day | ORAL | Status: DC
  Administered 2021-02-27 – 2021-03-02 (×4): 200 ug via ORAL
  Filled 2021-02-26 (×4): qty 2

## 2021-02-26 MED ORDER — LIDOCAINE 2% (20 MG/ML) 5 ML SYRINGE
INTRAMUSCULAR | Status: DC | PRN
  Administered 2021-02-26: 60 mg via INTRAVENOUS

## 2021-02-26 MED ORDER — ONDANSETRON HCL 4 MG PO TABS: 4.0000 mg | ORAL_TABLET | Freq: Four times a day (QID) | ORAL | Status: DC | PRN

## 2021-02-26 MED ORDER — LIDOCAINE 2% (20 MG/ML) 5 ML SYRINGE
INTRAMUSCULAR | Status: AC
  Filled 2021-02-26: qty 5

## 2021-02-26 MED ORDER — CALCITRIOL 0.5 MCG PO CAPS
0.5000 ug | ORAL_CAPSULE | Freq: Every day | ORAL | Status: DC
  Administered 2021-02-27 – 2021-03-02 (×4): 0.5 ug via ORAL
  Filled 2021-02-26 (×4): qty 1

## 2021-02-26 MED ORDER — PROPOFOL 10 MG/ML IV BOLUS
INTRAVENOUS | Status: DC | PRN
  Administered 2021-02-26: 150 mg via INTRAVENOUS

## 2021-02-26 MED ORDER — DIPHENHYDRAMINE HCL 12.5 MG/5ML PO ELIX: 12.5000 mg | ORAL_SOLUTION | Freq: Four times a day (QID) | ORAL | Status: DC | PRN

## 2021-02-26 MED ORDER — ONDANSETRON HCL 4 MG/2ML IJ SOLN
INTRAMUSCULAR | Status: AC
  Filled 2021-02-26: qty 2

## 2021-02-26 MED ORDER — PROPOFOL 10 MG/ML IV BOLUS
INTRAVENOUS | Status: AC
  Filled 2021-02-26: qty 20

## 2021-02-26 MED ORDER — LISINOPRIL 10 MG PO TABS
10.0000 mg | ORAL_TABLET | Freq: Every day | ORAL | Status: DC
  Administered 2021-02-27 – 2021-03-02 (×4): 10 mg via ORAL
  Filled 2021-02-26 (×4): qty 1

## 2021-02-26 MED ORDER — MIDAZOLAM HCL 2 MG/2ML IJ SOLN
INTRAMUSCULAR | Status: DC | PRN
  Administered 2021-02-26: 2 mg via INTRAVENOUS

## 2021-02-26 MED ORDER — SIMETHICONE 80 MG PO CHEW: 40.0000 mg | CHEWABLE_TABLET | Freq: Four times a day (QID) | ORAL | Status: DC | PRN

## 2021-02-26 MED ORDER — CALCIUM CARBONATE-VITAMIN D 500-200 MG-UNIT PO TABS
2.0000 | ORAL_TABLET | Freq: Two times a day (BID) | ORAL | Status: DC
  Administered 2021-02-26 – 2021-03-02 (×8): 2 via ORAL
  Filled 2021-02-26 (×8): qty 2

## 2021-02-26 MED ORDER — ENSURE PRE-SURGERY PO LIQD
296.0000 mL | Freq: Once | ORAL | Status: DC
  Filled 2021-02-26: qty 296

## 2021-02-26 MED ORDER — PROMETHAZINE HCL 25 MG/ML IJ SOLN: 6.2500 mg | INTRAMUSCULAR | Status: DC | PRN

## 2021-02-26 MED ORDER — ROCURONIUM BROMIDE 10 MG/ML (PF) SYRINGE
PREFILLED_SYRINGE | INTRAVENOUS | Status: DC | PRN
  Administered 2021-02-26: 70 mg via INTRAVENOUS

## 2021-02-26 MED ORDER — BUPIVACAINE-EPINEPHRINE (PF) 0.25% -1:200000 IJ SOLN
INTRAMUSCULAR | Status: AC
  Filled 2021-02-26: qty 30

## 2021-02-26 MED ORDER — IBUPROFEN 400 MG PO TABS
600.0000 mg | ORAL_TABLET | Freq: Four times a day (QID) | ORAL | Status: DC | PRN
  Administered 2021-02-27: 600 mg via ORAL
  Filled 2021-02-26: qty 1

## 2021-02-26 MED ORDER — FENTANYL CITRATE (PF) 250 MCG/5ML IJ SOLN
INTRAMUSCULAR | Status: AC
  Filled 2021-02-26: qty 5

## 2021-02-26 MED ORDER — ONDANSETRON HCL 4 MG/2ML IJ SOLN
4.0000 mg | Freq: Four times a day (QID) | INTRAMUSCULAR | Status: DC | PRN
  Administered 2021-02-26 – 2021-02-28 (×2): 4 mg via INTRAVENOUS
  Filled 2021-02-26 (×2): qty 2

## 2021-02-26 MED ORDER — HYDROMORPHONE HCL 1 MG/ML IJ SOLN
0.2500 mg | INTRAMUSCULAR | Status: DC | PRN
  Administered 2021-02-26 (×2): 0.5 mg via INTRAVENOUS

## 2021-02-26 MED ORDER — ENSURE SURGERY PO LIQD
237.0000 mL | Freq: Two times a day (BID) | ORAL | Status: DC
  Administered 2021-02-26 – 2021-03-01 (×6): 237 mL via ORAL

## 2021-02-26 MED ORDER — DIPHENHYDRAMINE HCL 50 MG/ML IJ SOLN: 12.5000 mg | Freq: Four times a day (QID) | INTRAMUSCULAR | Status: DC | PRN

## 2021-02-26 MED ORDER — MIDAZOLAM HCL 2 MG/2ML IJ SOLN
INTRAMUSCULAR | Status: AC
  Filled 2021-02-26: qty 2

## 2021-02-26 MED ORDER — DEXAMETHASONE SODIUM PHOSPHATE 10 MG/ML IJ SOLN
INTRAMUSCULAR | Status: DC | PRN
  Administered 2021-02-26: 5 mg via INTRAVENOUS

## 2021-02-26 MED ORDER — MEPERIDINE HCL 50 MG/ML IJ SOLN: 6.2500 mg | INTRAMUSCULAR | Status: DC | PRN

## 2021-02-26 MED ORDER — LIDOCAINE HCL 2 % IJ SOLN
INTRAMUSCULAR | Status: AC
  Filled 2021-02-26: qty 20

## 2021-02-26 MED ORDER — METRONIDAZOLE 500 MG PO TABS: 1000.0000 mg | ORAL_TABLET | ORAL | Status: DC

## 2021-02-26 MED ORDER — LACTATED RINGERS IR SOLN
Status: DC | PRN
  Administered 2021-02-26: 1000 mL

## 2021-02-26 MED ORDER — POTASSIUM CHLORIDE CRYS ER 20 MEQ PO TBCR
20.0000 meq | EXTENDED_RELEASE_TABLET | Freq: Every day | ORAL | Status: DC
  Administered 2021-02-27 – 2021-03-02 (×4): 20 meq via ORAL
  Filled 2021-02-26 (×4): qty 1

## 2021-02-26 MED ORDER — CALTRATE 600+D PLUS MINERALS 600-800 MG-UNIT PO CHEW: 2.0000 | CHEWABLE_TABLET | Freq: Two times a day (BID) | ORAL | Status: DC

## 2021-02-26 MED ORDER — POLYETHYLENE GLYCOL 3350 17 GM/SCOOP PO POWD: 1.0000 | Freq: Once | ORAL | Status: DC

## 2021-02-26 MED ORDER — ROCURONIUM BROMIDE 10 MG/ML (PF) SYRINGE
PREFILLED_SYRINGE | INTRAVENOUS | Status: AC
  Filled 2021-02-26: qty 10

## 2021-02-26 MED ORDER — ALVIMOPAN 12 MG PO CAPS
12.0000 mg | ORAL_CAPSULE | Freq: Two times a day (BID) | ORAL | Status: DC
  Administered 2021-02-27 – 2021-02-28 (×3): 12 mg via ORAL
  Filled 2021-02-26 (×4): qty 1

## 2021-02-26 MED ORDER — HYDROMORPHONE HCL 1 MG/ML IJ SOLN
0.5000 mg | INTRAMUSCULAR | Status: DC | PRN
  Administered 2021-02-26: 1 mg via INTRAVENOUS
  Filled 2021-02-26: qty 1

## 2021-02-26 MED ORDER — SODIUM CHLORIDE 0.9 % IV SOLN
12.5000 mg | Freq: Four times a day (QID) | INTRAVENOUS | Status: DC | PRN
  Filled 2021-02-26 (×2): qty 0.5

## 2021-02-26 MED ORDER — KETAMINE HCL 10 MG/ML IJ SOLN
INTRAMUSCULAR | Status: DC | PRN
  Administered 2021-02-26: 35 mg via INTRAVENOUS

## 2021-02-26 MED ORDER — FENTANYL CITRATE (PF) 250 MCG/5ML IJ SOLN
INTRAMUSCULAR | Status: DC | PRN
  Administered 2021-02-26 (×3): 50 ug via INTRAVENOUS
  Administered 2021-02-26: 100 ug via INTRAVENOUS

## 2021-02-26 MED ORDER — KETOROLAC TROMETHAMINE 15 MG/ML IJ SOLN: 15.0000 mg | Freq: Once | INTRAMUSCULAR | Status: DC

## 2021-02-26 MED ORDER — CHLORHEXIDINE GLUCONATE 0.12 % MT SOLN
15.0000 mL | Freq: Once | OROMUCOSAL | Status: AC
  Administered 2021-02-26: 15 mL via OROMUCOSAL

## 2021-02-26 MED ORDER — ONDANSETRON HCL 4 MG/2ML IJ SOLN
INTRAMUSCULAR | Status: DC | PRN
  Administered 2021-02-26: 4 mg via INTRAVENOUS

## 2021-02-26 MED ORDER — ALUM & MAG HYDROXIDE-SIMETH 200-200-20 MG/5ML PO SUSP: 30.0000 mL | Freq: Four times a day (QID) | ORAL | Status: DC | PRN

## 2021-02-26 MED ORDER — ENSURE PRE-SURGERY PO LIQD: 592.0000 mL | Freq: Once | ORAL | Status: DC

## 2021-02-26 SURGICAL SUPPLY — 59 items
APPLIER CLIP ROT 10 11.4 M/L (STAPLE)
BAG COUNTER SPONGE SURGICOUNT (BAG) IMPLANT
BLADE CLIPPER SURG (BLADE) ×2 IMPLANT
CABLE HIGH FREQUENCY MONO STRZ (ELECTRODE) ×2 IMPLANT
CELLS DAT CNTRL 66122 CELL SVR (MISCELLANEOUS) IMPLANT
CHLORAPREP W/TINT 26 (MISCELLANEOUS) ×2 IMPLANT
CLIP APPLIE ROT 10 11.4 M/L (STAPLE) IMPLANT
DECANTER SPIKE VIAL GLASS SM (MISCELLANEOUS) ×2 IMPLANT
DISSECTOR BLUNT TIP ENDO 5MM (MISCELLANEOUS) IMPLANT
ELECT PENCIL ROCKER SW 15FT (MISCELLANEOUS) ×2 IMPLANT
ELECT REM PT RETURN 15FT ADLT (MISCELLANEOUS) ×2 IMPLANT
GAUZE SPONGE 4X4 12PLY STRL (GAUZE/BANDAGES/DRESSINGS) ×2 IMPLANT
GLOVE SURG ENC MOIS LTX SZ7.5 (GLOVE) ×4 IMPLANT
GLOVE SURG LTX SZ8 (GLOVE) ×4 IMPLANT
GOWN STRL REUS W/TWL XL LVL3 (GOWN DISPOSABLE) ×8 IMPLANT
KIT TURNOVER KIT A (KITS) ×2 IMPLANT
LIGASURE IMPACT 36 18CM CVD LR (INSTRUMENTS) IMPLANT
NS IRRIG 1000ML POUR BTL (IV SOLUTION) ×4 IMPLANT
PACK COLON (CUSTOM PROCEDURE TRAY) ×2 IMPLANT
PAD POSITIONING PINK XL (MISCELLANEOUS) ×2 IMPLANT
PENCIL SMOKE EVACUATOR (MISCELLANEOUS) IMPLANT
PORT LAP GEL ALEXIS MED 5-9CM (MISCELLANEOUS) ×2 IMPLANT
PROTECTOR NERVE ULNAR (MISCELLANEOUS) ×2 IMPLANT
RELOAD PROXIMATE 75MM BLUE (ENDOMECHANICALS) ×2 IMPLANT
RTRCTR WOUND ALEXIS 18CM MED (MISCELLANEOUS)
SCISSORS LAP 5X35 DISP (ENDOMECHANICALS) ×2 IMPLANT
SEALER TISSUE G2 STRG ARTC 35C (ENDOMECHANICALS) IMPLANT
SET IRRIG TUBING LAPAROSCOPIC (IRRIGATION / IRRIGATOR) IMPLANT
SET TUBE SMOKE EVAC HIGH FLOW (TUBING) ×2 IMPLANT
SHEARS HARMONIC ACE PLUS 36CM (ENDOMECHANICALS) IMPLANT
SLEEVE ADV FIXATION 5X100MM (TROCAR) ×4 IMPLANT
SLEEVE ENDOPATH XCEL 5M (ENDOMECHANICALS) ×2 IMPLANT
SPONGE T-LAP 18X18 ~~LOC~~+RFID (SPONGE) ×4 IMPLANT
STAPLER 90 3.5 STAND SLIM (STAPLE) ×2
STAPLER 90 3.5 STD SLIM (STAPLE) ×1 IMPLANT
STAPLER PROXIMATE 75MM BLUE (STAPLE) ×2 IMPLANT
STAPLER VISISTAT 35W (STAPLE) ×2 IMPLANT
SUT MNCRL AB 4-0 PS2 18 (SUTURE) ×4 IMPLANT
SUT PDS AB 1 CT1 27 (SUTURE) ×4 IMPLANT
SUT PDS AB 1 CTX 36 (SUTURE) ×4 IMPLANT
SUT PROLENE 2 0 CT2 30 (SUTURE) IMPLANT
SUT PROLENE 2 0 KS (SUTURE) IMPLANT
SUT SILK 2 0 (SUTURE) ×1
SUT SILK 2 0 SH CR/8 (SUTURE) ×2 IMPLANT
SUT SILK 2-0 18XBRD TIE 12 (SUTURE) ×1 IMPLANT
SUT SILK 3 0 (SUTURE) ×1
SUT SILK 3 0 SH CR/8 (SUTURE) ×2 IMPLANT
SUT SILK 3-0 18XBRD TIE 12 (SUTURE) ×1 IMPLANT
SYS LAPSCP GELPORT 120MM (MISCELLANEOUS)
SYSTEM LAPSCP GELPORT 120MM (MISCELLANEOUS) IMPLANT
TAPE CLOTH 4X10 WHT NS (GAUZE/BANDAGES/DRESSINGS) IMPLANT
TOWEL OR 17X26 10 PK STRL BLUE (TOWEL DISPOSABLE) ×2 IMPLANT
TRAY FOLEY MTR SLVR 14FR STAT (SET/KITS/TRAYS/PACK) ×2 IMPLANT
TRAY FOLEY MTR SLVR 16FR STAT (SET/KITS/TRAYS/PACK) IMPLANT
TROCAR ADV FIXATION 5X100MM (TROCAR) ×2 IMPLANT
TROCAR BALLN 12MMX100 BLUNT (TROCAR) ×2 IMPLANT
TROCAR XCEL NON-BLD 11X100MML (ENDOMECHANICALS) IMPLANT
TUBING CONNECTING 10 (TUBING) ×4 IMPLANT
YANKAUER SUCT BULB TIP NO VENT (SUCTIONS) ×2 IMPLANT

## 2021-02-26 NOTE — Transfer of Care (Signed)
Immediate Anesthesia Transfer of Care Note  Patient: Brett Campbell  Procedure(s) Performed: LAPAROSCOPIC RIGHT HEMI COLECTOMY (Abdomen)  Patient Location: PACU  Anesthesia Type:General  Level of Consciousness: awake, drowsy and responds to stimulation  Airway & Oxygen Therapy: Patient Spontanous Breathing and Patient connected to face mask oxygen  Post-op Assessment: Report given to RN and Post -op Vital signs reviewed and stable  Post vital signs: Reviewed and stable  Last Vitals:  Vitals Value Taken Time  BP 147/80 02/26/21 1300  Temp    Pulse 79 02/26/21 1302  Resp 19 02/26/21 1302  SpO2 100 % 02/26/21 1302  Vitals shown include unvalidated device data.  Last Pain:  Vitals:   02/26/21 0846  TempSrc:   PainSc: 0-No pain         Complications: No notable events documented.

## 2021-02-26 NOTE — H&P (Signed)
CC: Referred by Dr. Tarri Glenn for endoscopically unresectable polyp at ileocecal valve - here today for surgery   HPI: [name] is a very pleasant 60yoM with hx of deafness, HTN, HLD, hypothyroidism who underwent colonoscopy with Dr. Tarri Glenn 10/21/20 which was done for heme positive stool.  He reportedly had a colonoscopy 5-6 years ago in [place] that was "okay".  He was found to have numerous polyps throughout his colon 1-8 mm in size that were removed.  He also had a 15 mm polyp in the descending colon that was removed.  There was a large carpeting multilobulated and ulcerated polyp in the cecum.  This extends over the ileocecal valve fold to the appendiceal orifice.  Biopsies were taken and the area was tattooed.  It was not endoscopically amenable to resection. Pathology returned with multiple fragments of tubular adenoma from the cecal biopsy.  The other polyps returned as tubular adenomas without dysplasia or malignancy.  There are also some hyperplastic polyps removed.  He was referred to our office after undergoing CT abdomen and pelvis.   CT AP 11/01/20 - 4.9 cm polypoid mass in the cecum.  2.9 cm infrarenal aortic aneurysm.  He is here today with his wife and a [place] interpreter. He denies any complaints today or changes in his health since we met in the office. He took his bowel prep with some nausea but good result.   PMH: deafness, HTN, HLD, hypothyroidism   PSH: Lap chole many years ago; thyroidectomy at age 50 for a goiter   FHx: Denies FHx of colorectal, breast, endometrial, ovarian or cervical cancer   Social: He reports he drinks one beer per day and smokes one cigarette per day.  Occasional marijuana use.  He is not currently working.  He is here today with his sister who lives with him and  whom he has requested be present to assist in translation; he did not want a 'formal' interpreter present   ROS: A comprehensive 10 system review of systems was completed with the  patient and pertinent findings as noted above.  Past Medical History:  Diagnosis Date   Calcium deficiency    Cataract    COPD (chronic obstructive pulmonary disease) ([place])    Deaf    Hypertension    Hypothyroidism    Personal history of colonic polyps 11/03/2020   Scarlet fever    Sleep apnea    no longer uses CPAP   Thyroid disease    Tuberculosis    [place]. Health department is contact with him due to hx.    Past Surgical History:  Procedure Laterality Date   CHOLECYSTECTOMY     COLONOSCOPY     +5years   THYROIDECTOMY      Family History  Problem Relation Age of Onset   CAD Mother    Diabetes Mellitus II Mother    Diabetes Mother    CAD Father    Heart attack Father    Heart disease Father    Diabetes Sister    Colon cancer Neg Hx    Esophageal cancer Neg Hx    Stomach cancer Neg Hx    Colon polyps Neg Hx     Social:  reports that he has been smoking cigarettes. He has a 11.25 pack-year smoking history. He has never used smokeless tobacco. He reports current alcohol use of about 1.0 - 2.0 standard drink of alcohol per week. He reports current drug use. Drug: Marijuana.  Allergies:  Allergies  Allergen Reactions  Penicillins     unknown    Medications: I have reviewed the patient's current medications.  No results found for this or any previous visit (from the past 48 hour(s)).  No results found.  ROS - all of the below systems have been reviewed with the patient and positives are indicated with bold text General: chills, fever or night sweats Eyes: blurry vision or double vision ENT: epistaxis or sore throat Allergy/Immunology: itchy/watery eyes or nasal congestion Hematologic/Lymphatic: bleeding problems, blood clots or swollen lymph nodes Endocrine: temperature intolerance or unexpected weight changes Breast: new or changing breast lumps or nipple discharge Resp: cough, shortness of breath, or wheezing CV: chest pain or dyspnea on exertion GI:  as per HPI GU: dysuria, trouble voiding, or hematuria MSK: joint pain or joint stiffness Neuro: TIA or stroke symptoms Derm: pruritus and skin lesion changes Psych: anxiety and depression  PE Blood pressure 140/78, pulse 71, temperature 98 F (36.7 C), temperature source Oral, resp. rate 16, SpO2 97 %. Constitutional: NAD; conversant; no deformities; wearing mask Eyes: Moist conjunctiva; no lid lag; anicteric Neck: Trachea midline; no thyromegaly Lungs: Normal respiratory effort; no tactile fremitus CV: RRR; no pitting edema GI: Abd soft, nontender, nondistended; no palpable hepatosplenomegaly MSK: Normal range of motion of extremities Psychiatric: Appropriate affect; alert and oriented x3   A/P: [name] is a very pleasant 60yoM with hx deafness, HTN, HLD, hypothyroidism here for evaluation of an endoscopically unresectable colon polyp that is roughly 4.9 cm in size originating in the cecum. Biopsies thus far showed tubular adenoma. Tattoo'd; CT confirmed as well   CT AP 10/2020 - no evidence of metastatic disease; 2.9 cm infrarenal aortic aneurysm   -Genetics referral given number of polyps removed (>10) - returned negative -The anatomy and physiology of the GI tract was reviewed. The pathophysiology of colon polyps and cancer was discussed as well. -We are planning to proceed with laparoscopic right hemicolectomy today. -The planned procedure, material risks (including, but not limited to, pain, bleeding, infection, scarring, need for blood transfusion, damage to surrounding structures- blood vessels/nerves/viscus/organs, damage to ureter, urine leak, leak from anastomosis, need for additional procedures, worsening of pre-existing medical conditions, need for stoma which could be permanent, hernia, recurrence, DVT/PE, pneumonia, heart attack, stroke, death) benefits and alternatives to surgery were discussed at length. The patient's and his sister's questions were answered to  their satisfaction, they voiced understanding and elected to proceed with surgery. Additionally, we discussed typical postoperative expectations and the recovery process.   Nadeen Landau, MD [facility] Use [email or web address] to contact on call provider

## 2021-02-26 NOTE — Anesthesia Procedure Notes (Signed)
Procedure Name: Intubation Date/Time: 02/26/2021 11:06 AM Performed by: Lollie Sails, CRNA Pre-anesthesia Checklist: Patient identified, Emergency Drugs available, Suction available, Patient being monitored and Timeout performed Patient Re-evaluated:Patient Re-evaluated prior to induction Oxygen Delivery Method: Circle system utilized Preoxygenation: Pre-oxygenation with 100% oxygen Induction Type: IV induction Ventilation: Mask ventilation without difficulty and Oral airway inserted - appropriate to patient size Laryngoscope Size: Miller and 3 Grade View: Grade III Tube type: Oral Tube size: 7.5 mm Number of attempts: 1 Airway Equipment and Method: Stylet Placement Confirmation: ETT inserted through vocal cords under direct vision, positive ETCO2 and breath sounds checked- equal and bilateral Secured at: 23 cm Tube secured with: Tape Dental Injury: Teeth and Oropharynx as per pre-operative assessment  Difficulty Due To: Difficult Airway- due to anterior larynx Comments: Poor dentition.   Able to visualize base of laryngeal opening with external laryngeal pressure.   All teeth intact after intubation.

## 2021-02-26 NOTE — Anesthesia Preprocedure Evaluation (Addendum)
Anesthesia Evaluation  Patient identified by MRN, date of birth, ID band Patient awake    Reviewed: Allergy & Precautions, NPO status , Patient's Chart, lab work & pertinent test results  Airway Mallampati: I       Dental  (+) Poor Dentition, Missing,    Pulmonary Current Smoker and Patient abstained from smoking.,    Pulmonary exam normal        Cardiovascular hypertension, Pt. on medications Normal cardiovascular exam     Neuro/Psych negative neurological ROS  negative psych ROS   GI/Hepatic negative GI ROS, Neg liver ROS,   Endo/Other  Hypothyroidism   Renal/GU negative Renal ROS  negative genitourinary   Musculoskeletal negative musculoskeletal ROS (+)   Abdominal Normal abdominal exam  (+)   Peds  Hematology negative hematology ROS (+)   Anesthesia Other Findings   Reproductive/Obstetrics                            Anesthesia Physical Anesthesia Plan  ASA: 2  Anesthesia Plan: General   Post-op Pain Management:    Induction: Intravenous  PONV Risk Score and Plan: 3 and Ondansetron, Dexamethasone and Midazolam  Airway Management Planned: Oral ETT  Additional Equipment: None  Intra-op Plan:   Post-operative Plan: Extubation in OR  Informed Consent: I have reviewed the patients History and Physical, chart, labs and discussed the procedure including the risks, benefits and alternatives for the proposed anesthesia with the patient or authorized representative who has indicated his/her understanding and acceptance.     Dental advisory given  Plan Discussed with: CRNA  Anesthesia Plan Comments:         Anesthesia Quick Evaluation

## 2021-02-26 NOTE — Op Note (Signed)
PATIENT: Brett Campbell  60 y.o. male  Patient Care Team: Ferd Hibbs, NP as PCP - General (Nurse Practitioner)  PREOP DIAGNOSIS: Cecal polypoid mass  POSTOP DIAGNOSIS: Same  PROCEDURE: Laparoscopic right hemicolectomy  SURGEON: Sharon Mt. Ammi Hutt, MD  ASSISTANT: Leighton Ruff, MD  ANESTHESIA: General endotracheal  EBL: 50 mL Total I/O In: 1160.5 [I.V.:1000; IV Piggyback:160.5] Out: 225 [Urine:175; Blood:50]  DRAINS: None  SPECIMEN: Right colon (including terminal ileum, appendix en bloc)  COUNTS: Sponge, needle and instrument counts were reported correct x2  FINDINGS:  No obvious metastatic disease on visceral parietal peritoneum or liver. Tattoo evident in cecum. Right hemicolectomy carried out with stapled ileocolic anastomosis to proximal transverse colon.  NARRATIVE:  The patient was identified & brought into the operating room, placed supine on the operating table and SCDs were applied to the lower extremities. General endotracheal anesthesia was induced. The patient was positioned supine with arms tucked. Antibiotics were administered. A foley catheter was placed under sterile conditions. Hair in the region of planned surgery was clipped. The abdomen was prepped and draped in a sterile fashion. A timeout was performed confirming our patient and plan.   Beginning with the extraction port, a supraumbilical incision was made and carried down to the midline fascia. This was then incised with electrocautery. The peritoneum was identified and elevated between clamps and carefully opened sharply. A small Alexis wound protector with a cap and associated port was then placed. The abdomen was insufflated to 15 mmHg with Co2. A laparoscope was placed and camera inspection revealed no evidence of injury. Bilateral TAP blocks were then performed under laparoscopic visualization using a mixture of 0.25% marcaine with epinepherine + Exparel. Additional ports were then placed under  direct laparoscopic visualization - two in the left hemiabdomen and one in the right abdomen. The abdomen was surveyed. The liver and peritoneum appeared normal.  There were no signs of metastatic disease. The tattoo is visualized in the proximal cecum. The appendix is normal in appearance. The terminal ileum is normal in appearance.  A lateral to medial approach was employed. The cecum was retracted medially. The ascending colon was mobilized by incising the Ryland Smoots line of Toldt. The associated mesocolon was reflected medially. The duodenum was identified and protected, being swept 'down.' The hepatic flexure was approached and mobilized. He was then repositioned into reverse trendelenburg. The omentum was lifted anteriorly and the lesser sac entered.  The hepatic flexure was then fully mobilized working from medial to lateral.  At this point, the entire right colon was free.  The cecum reached into the left upper quadrant.  Attention was turned to the extracorporeal portion.  The abdomen was desufflated and the terminal ileum and right colon delivered through the wound protector. The distal ileum was then transected using a GIA blue load stapler. A Babcock clamp is placed on the proximal staple line to assist in maintaining orientation. The ileocolic pedicle was identified, then duodenum is down. The pedicle is circumferentially dissected and then ligated with the Enseal device. The pedicle is inspected and hemostatic.  The distal point of transection is identified on the transverse colon at a location that included the right branch of the middle colic.  This leaves the main middle colic pedicle supplying the remaining colon.  The remaining mesentery was divided using the Enseal device. The divided mesentery was inspected and noted to be hemostatic. The distal point of transection was then identified on the transverse colon at a location that included the right branch  of the middle colics, leaving the main  middle colic feeding the remaining transverse colon. This was transected using another blue load GIA stapler. The intervening mesentery ligated with the Enseal device. The specimen was then passed off. Attention was turned to creating the anastomosis. The terminal ileum and transverse colon were inspected for orientation to ensure no twisting nor bowel included in the mesenteric defect. An anastomosis was created between the terminal ileum and the transverse colon using a 75 mm GIA blue load stapler. The staple line was inspected and noted to be hemostatic.  The common enterotomy channel was closed using a TA 90 blue load stapler. Hemostasis was achieved at the staple line using 3-0 silk U-stitches. 3-0 silk sutures were used to imbricate the corners of the staple line as well.  A 2-0 silk suture was placed securing the apex of the anastomosis. The anastomosis was palpated and noted to be widely patent. This was then placed back into the abdomen. The abdomen was then irrigated with sterile saline and hemostasis verified. The omentum was then brought down over the anastomosis. The wound protector cap was replaced and Co2 reinsufflated. The laparoscopic ports were removed under direct visualization and the sites noted to be hemostatic. The Alexis wound protector was removed, counts were reported correct, and we switched to clean instruments, gowns and drapes.   The fascia was then closed using two running #1 PDS sutures.  The skin of all incision sites was closed with 4-0 monocryl subcuticular suture. Dermabond was placed on the port sites and a sterile dressing was placed over the abdominal incision. All counts were reported correct. The patient was then awakened from anesthesia and sent to the post anesthesia care unit in stable condition.   DISPOSITION: PACU in satisfactory condition

## 2021-02-26 NOTE — Plan of Care (Signed)
  Problem: Education: Goal: Required Educational Video(s) Outcome: Progressing   Problem: Clinical Measurements: Goal: Ability to maintain clinical measurements within normal limits will improve Outcome: Progressing Goal: Postoperative complications will be avoided or minimized Outcome: Progressing   Problem: Skin Integrity: Goal: Demonstration of wound healing without infection will improve Outcome: Progressing   Problem: Education: Goal: Knowledge of General Education information will improve Description: Including pain rating scale, medication(s)/side effects and non-pharmacologic comfort measures Outcome: Progressing   Problem: Health Behavior/Discharge Planning: Goal: Ability to manage health-related needs will improve Outcome: Progressing   Problem: Clinical Measurements: Goal: Ability to maintain clinical measurements within normal limits will improve Outcome: Progressing Goal: Will remain free from infection Outcome: Progressing Goal: Diagnostic test results will improve Outcome: Progressing Goal: Respiratory complications will improve Outcome: Progressing Goal: Cardiovascular complication will be avoided Outcome: Progressing   Problem: Activity: Goal: Risk for activity intolerance will decrease Outcome: Progressing   Problem: Nutrition: Goal: Adequate nutrition will be maintained Outcome: Progressing   Problem: Coping: Goal: Level of anxiety will decrease Outcome: Progressing   Problem: Elimination: Goal: Will not experience complications related to bowel motility Outcome: Progressing Goal: Will not experience complications related to urinary retention Outcome: Progressing   Problem: Pain Managment: Goal: General experience of comfort will improve Outcome: Progressing   Problem: Safety: Goal: Ability to remain free from injury will improve Outcome: Progressing   Problem: Skin Integrity: Goal: Risk for impaired skin integrity will  decrease Outcome: Progressing   Problem: Education: Goal: Knowledge of General Education information will improve Description: Including pain rating scale, medication(s)/side effects and non-pharmacologic comfort measures Outcome: Progressing   Problem: Health Behavior/Discharge Planning: Goal: Ability to manage health-related needs will improve Outcome: Progressing   Problem: Clinical Measurements: Goal: Ability to maintain clinical measurements within normal limits will improve Outcome: Progressing Goal: Will remain free from infection Outcome: Progressing Goal: Diagnostic test results will improve Outcome: Progressing Goal: Respiratory complications will improve Outcome: Progressing Goal: Cardiovascular complication will be avoided Outcome: Progressing   Problem: Activity: Goal: Risk for activity intolerance will decrease Outcome: Progressing   Problem: Nutrition: Goal: Adequate nutrition will be maintained Outcome: Progressing   Problem: Coping: Goal: Level of anxiety will decrease Outcome: Progressing   Problem: Elimination: Goal: Will not experience complications related to bowel motility Outcome: Progressing Goal: Will not experience complications related to urinary retention Outcome: Progressing   Problem: Pain Managment: Goal: General experience of comfort will improve Outcome: Progressing   Problem: Safety: Goal: Ability to remain free from injury will improve Outcome: Progressing   Problem: Skin Integrity: Goal: Risk for impaired skin integrity will decrease Outcome: Progressing

## 2021-02-27 ENCOUNTER — Encounter (HOSPITAL_COMMUNITY): Payer: Self-pay | Admitting: Surgery

## 2021-02-27 LAB — CBC
HCT: 42.1 % (ref 39.0–52.0)
Hemoglobin: 14.5 g/dL (ref 13.0–17.0)
MCH: 30.8 pg (ref 26.0–34.0)
MCHC: 34.4 g/dL (ref 30.0–36.0)
MCV: 89.4 fL (ref 80.0–100.0)
Platelets: 212 10*3/uL (ref 150–400)
RBC: 4.71 MIL/uL (ref 4.22–5.81)
RDW: 11.7 % (ref 11.5–15.5)
WBC: 22.1 10*3/uL — ABNORMAL HIGH (ref 4.0–10.5)
nRBC: 0 % (ref 0.0–0.2)

## 2021-02-27 LAB — BASIC METABOLIC PANEL
Anion gap: 9 (ref 5–15)
BUN: 16 mg/dL (ref 6–20)
CO2: 25 mmol/L (ref 22–32)
Calcium: 7.5 mg/dL — ABNORMAL LOW (ref 8.9–10.3)
Chloride: 104 mmol/L (ref 98–111)
Creatinine, Ser: 1 mg/dL (ref 0.61–1.24)
GFR, Estimated: >60 mL/min (ref >60)
Glucose, Bld: 129 mg/dL — ABNORMAL HIGH (ref 70–99)
Potassium: 4.3 mmol/L (ref 3.5–5.1)
Sodium: 138 mmol/L (ref 135–145)

## 2021-02-27 MED ORDER — CHLORHEXIDINE GLUCONATE CLOTH 2 % EX PADS: 6.0000 | MEDICATED_PAD | Freq: Every day | CUTANEOUS | Status: DC

## 2021-02-27 NOTE — Plan of Care (Signed)
  Problem: Education: Goal: Required Educational Video(s) Outcome: Progressing   Problem: Clinical Measurements: Goal: Ability to maintain clinical measurements within normal limits will improve Outcome: Progressing Goal: Postoperative complications will be avoided or minimized Outcome: Progressing   Problem: Skin Integrity: Goal: Demonstration of wound healing without infection will improve Outcome: Progressing   Problem: Education: Goal: Knowledge of General Education information will improve Description: Including pain rating scale, medication(s)/side effects and non-pharmacologic comfort measures Outcome: Progressing   Problem: Health Behavior/Discharge Planning: Goal: Ability to manage health-related needs will improve Outcome: Progressing   Problem: Clinical Measurements: Goal: Ability to maintain clinical measurements within normal limits will improve Outcome: Progressing Goal: Will remain free from infection Outcome: Progressing Goal: Diagnostic test results will improve Outcome: Progressing Goal: Respiratory complications will improve Outcome: Progressing Goal: Cardiovascular complication will be avoided Outcome: Progressing   Problem: Activity: Goal: Risk for activity intolerance will decrease Outcome: Progressing   Problem: Nutrition: Goal: Adequate nutrition will be maintained Outcome: Progressing   Problem: Coping: Goal: Level of anxiety will decrease Outcome: Progressing   Problem: Elimination: Goal: Will not experience complications related to bowel motility Outcome: Progressing Goal: Will not experience complications related to urinary retention Outcome: Progressing   Problem: Pain Managment: Goal: General experience of comfort will improve Outcome: Progressing   Problem: Safety: Goal: Ability to remain free from injury will improve Outcome: Progressing   Problem: Skin Integrity: Goal: Risk for impaired skin integrity will  decrease Outcome: Progressing   Problem: Education: Goal: Knowledge of General Education information will improve Description: Including pain rating scale, medication(s)/side effects and non-pharmacologic comfort measures Outcome: Progressing   Problem: Health Behavior/Discharge Planning: Goal: Ability to manage health-related needs will improve Outcome: Progressing   Problem: Clinical Measurements: Goal: Ability to maintain clinical measurements within normal limits will improve Outcome: Progressing Goal: Will remain free from infection Outcome: Progressing Goal: Diagnostic test results will improve Outcome: Progressing Goal: Respiratory complications will improve Outcome: Progressing Goal: Cardiovascular complication will be avoided Outcome: Progressing   Problem: Activity: Goal: Risk for activity intolerance will decrease Outcome: Progressing   Problem: Nutrition: Goal: Adequate nutrition will be maintained Outcome: Progressing   Problem: Coping: Goal: Level of anxiety will decrease Outcome: Progressing   Problem: Elimination: Goal: Will not experience complications related to bowel motility Outcome: Progressing Goal: Will not experience complications related to urinary retention Outcome: Progressing   Problem: Pain Managment: Goal: General experience of comfort will improve Outcome: Progressing   Problem: Safety: Goal: Ability to remain free from injury will improve Outcome: Progressing   Problem: Skin Integrity: Goal: Risk for impaired skin integrity will decrease Outcome: Progressing

## 2021-02-27 NOTE — Progress Notes (Signed)
Subjective No acute events. Feeling reasonably well. Passing some flatus, no bm yet. Tolerating liquids. Had some nausea yesterday immediately postop, none this morning.  Objective: Vital signs in last 24 hours: Temp:  [97.4 F (36.3 C)-98.2 F (36.8 C)] 98 F (36.7 C) (07/07 0551) Pulse Rate:  [66-81] 69 (07/07 0551) Resp:  [14-21] 18 (07/07 0551) BP: (111-156)/(67-80) 128/69 (07/07 0551) SpO2:  [95 %-100 %] 95 % (07/07 0551) Weight:  [83.1 kg-84.4 kg] 83.1 kg (07/07 0623) Last BM Date: 02/24/21  Intake/Output from previous day: 07/06 0701 - 07/07 0700 In: 3330.2 [P.O.:960; I.V.:2209.7; IV Piggyback:160.5] Out: 2275 [Urine:2225; Blood:50] Intake/Output this shift: No intake/output data recorded.  Gen: NAD, comfortable CV: RRR Pulm: Normal work of breathing Abd: Soft, incisional tenderness, no tenderness elsewhere. Not significantly distended Ext: SCDs in place  Lab Results: CBC  Recent Labs    02/26/21 0905 02/27/21 0410  WBC 13.8* 22.1*  HGB 16.1 14.5  HCT 44.6 42.1  PLT 232 212   BMET Recent Labs    02/26/21 0905 02/27/21 0410  NA 138 138  K 3.9 4.3  CL 104 104  CO2 21* 25  GLUCOSE 106* 129*  BUN 19 16  CREATININE 1.24 1.00  CALCIUM 8.0* 7.5*   PT/INR Recent Labs    02/26/21 0905  LABPROT 12.9  INR 1.0   ABG No results for input(s): PHART, HCO3 in the last 72 hours.  Invalid input(s): PCO2, PO2  Studies/Results:  Anti-infectives: Anti-infectives (From admission, onward)    Start     Dose/Rate Route Frequency Ordered Stop   02/26/21 1400  neomycin (MYCIFRADIN) tablet 1,000 mg  Status:  Discontinued       See Hyperspace for full Linked Orders Report.   1,000 mg Oral 3 times per day 02/26/21 0806 02/26/21 0808   02/26/21 1400  metroNIDAZOLE (FLAGYL) tablet 1,000 mg  Status:  Discontinued       See Hyperspace for full Linked Orders Report.   1,000 mg Oral 3 times per day 02/26/21 0806 02/26/21 0808   02/26/21 0600  clindamycin (CLEOCIN)  IVPB 900 mg       See Hyperspace for full Linked Orders Report.   900 mg 100 mL/hr over 30 Minutes Intravenous 60 min pre-op 02/25/21 0649 02/26/21 1132   02/26/21 0600  gentamicin (GARAMYCIN) 420 mg in dextrose 5 % 100 mL IVPB       See Hyperspace for full Linked Orders Report.   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

## 2021-02-28 ENCOUNTER — Other Ambulatory Visit (HOSPITAL_COMMUNITY): Payer: Self-pay

## 2021-02-28 LAB — SURGICAL PATHOLOGY

## 2021-02-28 LAB — CBC
HCT: 41.8 % (ref 39.0–52.0)
Hemoglobin: 14.3 g/dL (ref 13.0–17.0)
MCH: 30.8 pg (ref 26.0–34.0)
MCHC: 34.2 g/dL (ref 30.0–36.0)
MCV: 90.1 fL (ref 80.0–100.0)
Platelets: 186 10*3/uL (ref 150–400)
RBC: 4.64 MIL/uL (ref 4.22–5.81)
RDW: 12 % (ref 11.5–15.5)
WBC: 13.9 10*3/uL — ABNORMAL HIGH (ref 4.0–10.5)
nRBC: 0 % (ref 0.0–0.2)

## 2021-02-28 LAB — BASIC METABOLIC PANEL
Anion gap: 10 (ref 5–15)
BUN: 13 mg/dL (ref 6–20)
CO2: 22 mmol/L (ref 22–32)
Calcium: 7.4 mg/dL — ABNORMAL LOW (ref 8.9–10.3)
Chloride: 105 mmol/L (ref 98–111)
Creatinine, Ser: 1.06 mg/dL (ref 0.61–1.24)
GFR, Estimated: >60 mL/min (ref >60)
Glucose, Bld: 108 mg/dL — ABNORMAL HIGH (ref 70–99)
Potassium: 3.3 mmol/L — ABNORMAL LOW (ref 3.5–5.1)
Sodium: 137 mmol/L (ref 135–145)

## 2021-02-28 MED ORDER — TRAMADOL HCL 50 MG PO TABS
50.0000 mg | ORAL_TABLET | Freq: Four times a day (QID) | ORAL | 0 refills | Status: AC | PRN
  Filled 2021-02-28: qty 15, 4d supply, fill #0

## 2021-02-28 NOTE — Anesthesia Postprocedure Evaluation (Signed)
Anesthesia Post Note  Patient: Brett Campbell  Procedure(s) Performed: LAPAROSCOPIC RIGHT HEMI COLECTOMY (Abdomen)     Patient location during evaluation: PACU Anesthesia Type: General Level of consciousness: sedated Pain management: pain level controlled Vital Signs Assessment: post-procedure vital signs reviewed and stable Respiratory status: spontaneous breathing Cardiovascular status: stable Postop Assessment: no apparent nausea or vomiting Anesthetic complications: no   No notable events documented.  Last Vitals:  Vitals:   02/28/21 0547 02/28/21 1334  BP: (!) 147/76 139/76  Pulse: 87 86  Resp: 20 18  Temp: 36.9 C 36.6 C  SpO2: 93% 94%    Last Pain:  Vitals:   02/28/21 1334  TempSrc: Oral  PainSc:                  Huston Foley

## 2021-02-28 NOTE — Discharge Instructions (Signed)
POST OP INSTRUCTIONS AFTER COLON SURGERY  DIET: Be sure to include lots of fluids daily to stay hydrated - 64oz of water per day (8, 8 oz glasses).  Avoid fast food or heavy meals for the first couple of weeks as your are more likely to get nauseated. Avoid raw/uncooked fruits or vegetables for the first 4 weeks (its ok to have these if they are blended into smoothie form). If you have fruits/vegetables, make sure they are cooked until soft enough to mash on the roof of your mouth and chew your food well. Otherwise, diet as tolerated.  Take your usually prescribed home medications unless otherwise directed.  PAIN CONTROL: Pain is best controlled by a usual combination of three different methods TOGETHER: Ice/Heat Over the counter pain medication Prescription pain medication Most patients will experience some swelling and bruising around the surgical site.  Ice packs or heating pads (30-60 minutes up to 6 times a day) will help. Some people prefer to use ice alone, heat alone, alternating between ice & heat.  Experiment to what works for you.  Swelling and bruising can take several weeks to resolve.   It is helpful to take an over-the-counter pain medication regularly for the first few weeks: Ibuprofen (Motrin/Advil) - 200mg tabs - take 3 tabs (600mg) every 6 hours as needed for pain (unless you have been directed previously to avoid NSAIDs/ibuprofen) Acetaminophen (Tylenol) - you may take 650mg every 6 hours as needed. You can take this with motrin as they act differently on the body. If you are taking a narcotic pain medication that has acetaminophen in it, do not take over the counter tylenol at the same time. NOTE: You may take both of these medications together - most patients  find it most helpful when alternating between the two (i.e. Ibuprofen at 6am, tylenol at 9am, ibuprofen at 12pm ...) A  prescription for pain medication should be given to you upon discharge.  Take your pain medication as  prescribed if your pain is not adequatly controlled with the over-the-counter pain reliefs mentioned above.  Avoid getting constipated.  Between the surgery and the pain medications, it is common to experience some constipation.  Increasing fluid intake and taking a fiber supplement (such as Metamucil, Citrucel, FiberCon, MiraLax, etc) 1-2 times a day regularly will usually help prevent this problem from occurring.  A mild laxative (prune juice, Milk of Magnesia, MiraLax, etc) should be taken according to package directions if there are no bowel movements after 48 hours.    Dressing: Your incisions are covered in Dermabond which is like sterile superglue for the skin. This will come off on it's own in a couple weeks. It is waterproof and you may bathe normally starting the day after your surgery in a shower. Avoid baths/pools/lakes/oceans until your wounds have fully healed.  ACTIVITIES as tolerated:   Avoid heavy lifting (>10lbs or 1 gallon of milk) for the next 6 weeks. You may resume regular daily activities as tolerated--such as daily self-care, walking, climbing stairs--gradually increasing activities as tolerated.  If you can walk 30 minutes without difficulty, it is safe to try more intense activity such as jogging, treadmill, bicycling, low-impact aerobics.  DO NOT PUSH THROUGH PAIN.  Let pain be your guide: If it hurts to do something, don't do it. You may drive when you are no longer taking prescription pain medication, you can comfortably wear a seatbelt, and you can safely maneuver your car and apply brakes.  FOLLOW UP in our   office Please call [facility] at [phone] to set up an appointment to see your surgeon in the office for a follow-up appointment approximately 2 weeks after your surgery. Make sure that you call for this appointment the day you arrive home to insure a convenient appointment time.  9. If you have disability or family leave forms that need to be completed, you may have  them completed by your primary care physician's office; for return to work instructions, please ask our office staff and they will be happy to assist you in obtaining this documentation   When to call us [phone]: Poor pain control Reactions / problems with new medications (rash/itching, etc)  Fever over 101.5 F (38.5 C) Inability to urinate Nausea/vomiting Worsening swelling or bruising Continued bleeding from incision. Increased pain, redness, or drainage from the incision  The clinic staff is available to answer your questions during regular business hours (8:30am-5pm).  Please don't hesitate to call and ask to speak to one of our nurses for clinical concerns.   A surgeon from [facility] is always on call at the hospitals   If you have a medical emergency, go to the nearest emergency room or call 911.  [facility] [address], [address], [place], [place]  [address] MAIN: [phone] FAX: [phone] [email or web address]  

## 2021-02-28 NOTE — Progress Notes (Signed)
Subjective No acute events. Feeling reasonably well. Passing some flatus, no bm yet. Some nausea and reported emesis x1 yesterday.  Objective: Vital signs in last 24 hours: Temp:  [98 F (36.7 C)-98.5 F (36.9 C)] 98.5 F (36.9 C) (07/08 0547) Pulse Rate:  [73-87] 87 (07/08 0547) Resp:  [16-20] 20 (07/08 0547) BP: (126-147)/(69-76) 147/76 (07/08 0547) SpO2:  [93 %-94 %] 93 % (07/08 0547) Last BM Date: 02/28/21  Intake/Output from previous day: 07/07 0701 - 07/08 0700 In: 1997 [P.O.:1360; I.V.:637] Out: 900 [Urine:850; Emesis/NG output:50] Intake/Output this shift: No intake/output data recorded.  Gen: NAD, comfortable CV: RRR Pulm: Normal work of breathing Abd: Soft, incisional tenderness, no tenderness elsewhere. Not significantly distended Ext: SCDs in place  Lab Results: CBC  Recent Labs    02/27/21 0410 02/28/21 0428  WBC 22.1* 13.9*  HGB 14.5 14.3  HCT 42.1 41.8  PLT 212 186   BMET Recent Labs    02/27/21 0410 02/28/21 0428  NA 138 137  K 4.3 3.3*  CL 104 105  CO2 25 22  GLUCOSE 129* 108*  BUN 16 13  CREATININE 1.00 1.06  CALCIUM 7.5* 7.4*   PT/INR Recent Labs    02/26/21 0905  LABPROT 12.9  INR 1.0   ABG No results for input(s): PHART, HCO3 in the last 72 hours.  Invalid input(s): PCO2, PO2  Studies/Results:  Anti-infectives: Anti-infectives (From admission, onward)    Start     Dose/Rate Route Frequency Ordered Stop   02/26/21 1400  neomycin (MYCIFRADIN) tablet 1,000 mg  Status:  Discontinued       See Hyperspace for full Linked Orders Report.   1,000 mg Oral 3 times per day 02/26/21 0806 02/26/21 0808   02/26/21 1400  metroNIDAZOLE (FLAGYL) tablet 1,000 mg  Status:  Discontinued       See Hyperspace for full Linked Orders Report.   1,000 mg Oral 3 times per day 02/26/21 0806 02/26/21 0808   02/26/21 0600  clindamycin (CLEOCIN) IVPB 900 mg       See Hyperspace for full Linked Orders Report.   900 mg 100 mL/hr over 30 Minutes  Intravenous 60 min pre-op 02/25/21 0649 02/26/21 1132   02/26/21 0600  gentamicin (GARAMYCIN) 420 mg in dextrose 5 % 100 mL IVPB       See Hyperspace for full Linked Orders Report.   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

## 2021-02-28 NOTE — Progress Notes (Signed)
Pt with 1 episode of N/V.  Approximately 50 cc of clear emesis noted. Prn Zofran given per MD order(See Mar). Instructed Pt to rinse his mouth with water and offered a wet towel to wipe his face.  Patient communicated that he was feeling better. Requested to turn office room lights and leave the door open.

## 2021-02-28 NOTE — Progress Notes (Signed)
Pharmacy Brief Note - Alvimopan (Entereg)  The standing order set for alvimopan (Entereg) now includes an automatic order to discontinue the drug after the patient has had a bowel movement. The change was approved by the [facility] and the Medical Executive Committee.   This patient has had bowel movements documented by nursing. Therefore, alvimopan has been discontinued. If there are questions, please contact the pharmacy at [phone].   Thank you-  Dia Sitter, PharmD, BCPS 02/28/2021 10:07 AM

## 2021-03-01 LAB — BASIC METABOLIC PANEL
Anion gap: 12 (ref 5–15)
BUN: 13 mg/dL (ref 6–20)
CO2: 23 mmol/L (ref 22–32)
Calcium: 7.8 mg/dL — ABNORMAL LOW (ref 8.9–10.3)
Chloride: 104 mmol/L (ref 98–111)
Creatinine, Ser: 1.13 mg/dL (ref 0.61–1.24)
GFR, Estimated: >60 mL/min (ref >60)
Glucose, Bld: 93 mg/dL (ref 70–99)
Potassium: 3.5 mmol/L (ref 3.5–5.1)
Sodium: 139 mmol/L (ref 135–145)

## 2021-03-01 LAB — CBC
HCT: 41.4 % (ref 39.0–52.0)
Hemoglobin: 14.3 g/dL (ref 13.0–17.0)
MCH: 30.8 pg (ref 26.0–34.0)
MCHC: 34.5 g/dL (ref 30.0–36.0)
MCV: 89 fL (ref 80.0–100.0)
Platelets: 201 10*3/uL (ref 150–400)
RBC: 4.65 MIL/uL (ref 4.22–5.81)
RDW: 11.7 % (ref 11.5–15.5)
WBC: 13.7 10*3/uL — ABNORMAL HIGH (ref 4.0–10.5)
nRBC: 0 % (ref 0.0–0.2)

## 2021-03-01 NOTE — Progress Notes (Signed)
3 Days Post-Op  Subjective: Passing flatus. No nausea or vomiting. Tolerating full liquids.   Objective: Vital signs in last 24 hours: Temp:  [97.9 F (36.6 C)-98.4 F (36.9 C)] 98.2 F (36.8 C) (07/09 0553) Pulse Rate:  [74-86] 83 (07/09 0553) Resp:  [14-18] 16 (07/09 0553) BP: (139-148)/(76-84) 148/82 (07/09 0553) SpO2:  [94 %-96 %] 95 % (07/09 0553) Weight:  [83.2 kg] 83.2 kg (07/09 0553) Last BM Date: 02/28/21  Intake/Output from previous day: 07/08 0701 - 07/09 0700 In: 600 [P.O.:600] Out: 0  Intake/Output this shift: Total I/O In: 240 [P.O.:240] Out: 0   PE: General: resting comfortably, NAD Neuro: alert and oriented, no focal deficits Resp: normal work of breathing on room air Abdomen: soft, nondistended, nontender to palpation. Incisions clean and dry, no erythema, induration or drainage. Extremities: warm and well-perfused  Lab Results:  Recent Labs    02/28/21 0428 03/01/21 0414  WBC 13.9* 13.7*  HGB 14.3 14.3  HCT 41.8 41.4  PLT 186 201   BMET Recent Labs    02/28/21 0428 03/01/21 0414  NA 137 139  K 3.3* 3.5  CL 105 104  CO2 22 23  GLUCOSE 108* 93  BUN 13 13  CREATININE 1.06 1.13  CALCIUM 7.4* 7.8*   PT/INR No results for input(s): LABPROT, INR in the last 72 hours. CMP     Component Value Date/Time   NA 139 03/01/2021 0414   K 3.5 03/01/2021 0414   CL 104 03/01/2021 0414   CO2 23 03/01/2021 0414   GLUCOSE 93 03/01/2021 0414   BUN 13 03/01/2021 0414   CREATININE 1.13 03/01/2021 0414   CALCIUM 7.8 (L) 03/01/2021 0414   PROT 7.9 02/26/2021 0905   ALBUMIN 4.7 02/26/2021 0905   AST 31 02/26/2021 0905   ALT 26 02/26/2021 0905   ALKPHOS 55 02/26/2021 0905   BILITOT 1.0 02/26/2021 0905   GFRNONAA >60 03/01/2021 0414   Lipase  No results found for: LIPASE     Studies/Results: No results found.  Anti-infectives: Anti-infectives (From admission, onward)    Start     Dose/Rate Route Frequency Ordered Stop   02/26/21  1400  neomycin (MYCIFRADIN) tablet 1,000 mg  Status:  Discontinued       See Hyperspace for full Linked Orders Report.   1,000 mg Oral 3 times per day 02/26/21 0806 02/26/21 0808   02/26/21 1400  metroNIDAZOLE (FLAGYL) tablet 1,000 mg  Status:  Discontinued       See Hyperspace for full Linked Orders Report.   1,000 mg Oral 3 times per day 02/26/21 0806 02/26/21 0808   02/26/21 0600  clindamycin (CLEOCIN) IVPB 900 mg       See Hyperspace for full Linked Orders Report.   900 mg 100 mL/hr over 30 Minutes Intravenous 60 min pre-op 02/25/21 0649 02/26/21 1132   02/26/21 0600  gentamicin (GARAMYCIN) 420 mg in dextrose 5 % 100 mL IVPB       See Hyperspace for full Linked Orders Report.   5 mg/kg  84.4 kg 221 mL/hr over 30 Minutes Intravenous 60 min pre-op 02/25/21 5400 02/26/21 1224        Assessment/Plan  60 yo male POD3 s/p lap right hemicolectomy. - Advance to soft diet - Ambulate - SLIV - VTE: SQH, SCDs - Tentative discharge home tomorrow if tolerating PO     LOS: 3 days    Michaelle Birks, MD [facility] Surgery General, Hepatobiliary and Pancreatic Surgery 03/01/21 9:19 AM

## 2021-03-02 NOTE — Plan of Care (Signed)
  Problem: Education: Goal: Required Educational Video(s) Outcome: Progressing   Problem: Clinical Measurements: Goal: Ability to maintain clinical measurements within normal limits will improve Outcome: Progressing Goal: Postoperative complications will be avoided or minimized Outcome: Progressing   Problem: Skin Integrity: Goal: Demonstration of wound healing without infection will improve Outcome: Progressing   Problem: Education: Goal: Knowledge of General Education information will improve Description: Including pain rating scale, medication(s)/side effects and non-pharmacologic comfort measures Outcome: Progressing   Problem: Health Behavior/Discharge Planning: Goal: Ability to manage health-related needs will improve Outcome: Progressing   Problem: Clinical Measurements: Goal: Ability to maintain clinical measurements within normal limits will improve Outcome: Progressing Goal: Will remain free from infection Outcome: Progressing Goal: Diagnostic test results will improve Outcome: Progressing Goal: Respiratory complications will improve Outcome: Progressing Goal: Cardiovascular complication will be avoided Outcome: Progressing   Problem: Activity: Goal: Risk for activity intolerance will decrease Outcome: Progressing   Problem: Nutrition: Goal: Adequate nutrition will be maintained Outcome: Progressing   Problem: Coping: Goal: Level of anxiety will decrease Outcome: Progressing   Problem: Elimination: Goal: Will not experience complications related to bowel motility Outcome: Progressing Goal: Will not experience complications related to urinary retention Outcome: Progressing   Problem: Pain Managment: Goal: General experience of comfort will improve Outcome: Progressing   Problem: Safety: Goal: Ability to remain free from injury will improve Outcome: Progressing   Problem: Skin Integrity: Goal: Risk for impaired skin integrity will  decrease Outcome: Progressing   Problem: Education: Goal: Knowledge of General Education information will improve Description: Including pain rating scale, medication(s)/side effects and non-pharmacologic comfort measures Outcome: Progressing   Problem: Health Behavior/Discharge Planning: Goal: Ability to manage health-related needs will improve Outcome: Progressing   Problem: Clinical Measurements: Goal: Ability to maintain clinical measurements within normal limits will improve Outcome: Progressing Goal: Will remain free from infection Outcome: Progressing Goal: Diagnostic test results will improve Outcome: Progressing Goal: Respiratory complications will improve Outcome: Progressing Goal: Cardiovascular complication will be avoided Outcome: Progressing   Problem: Activity: Goal: Risk for activity intolerance will decrease Outcome: Progressing   Problem: Nutrition: Goal: Adequate nutrition will be maintained Outcome: Progressing   Problem: Coping: Goal: Level of anxiety will decrease Outcome: Progressing   Problem: Elimination: Goal: Will not experience complications related to bowel motility Outcome: Progressing Goal: Will not experience complications related to urinary retention Outcome: Progressing   Problem: Pain Managment: Goal: General experience of comfort will improve Outcome: Progressing   Problem: Safety: Goal: Ability to remain free from injury will improve Outcome: Progressing   Problem: Skin Integrity: Goal: Risk for impaired skin integrity will decrease Outcome: Progressing

## 2021-03-02 NOTE — Discharge Summary (Signed)
Physician Discharge Summary  Patient ID: Torri Langston MRN: [id number] DOB/AGE: 1961/06/26 60 y.o.  Admit date: 02/26/2021 Discharge date: 03/02/2021  Admission Diagnoses: Cecal polyp  Discharge Diagnoses:  Active Problems:   S/P right hemicolectomy   Discharged Condition: good  Hospital Course: [name] is a 60 yo male who was found to have a cecal polyp that was not amenable to endoscopic resection. Surgery was recommended. He was taken to the OR on 02/26/21 for a laparoscopic right hemicolectomy. Postoperatively he was admitted to the floor in stable condition. Foley was removed on POD1 and he was able to void. He was started on a liquid diet but had nausea and emesis on POD1. He was continued on a liquid diet and advanced to a soft diet on POD3, at which time he was having bowel function and tolerating liquids with no nausea or vomiting. On the morning of POD4 his pain was controlled, he was ambulating, and he was tolerating solid food with no nausea or vomiting. He was examined and deemed appropriate for discharge home.  Consults: None  Significant Diagnostic Studies: SURGICAL PATHOLOGY  CASE: [id number]  PATIENT: [place]  Surgical Pathology Report   Clinical History: Cecal polyp mass (crm)    FINAL MICROSCOPIC DIAGNOSIS:   A. 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 rate 18, height 5\' 10"  (1.778 m), weight 83.2 kg, SpO2 95 %. General appearance: alert, cooperative, and appears stated age Head: Normocephalic, without obvious abnormality, atraumatic Neck: supple, symmetrical, trachea midline Resp: normal work of breathing on room air GI: soft, nontender,  nondistended Extremities: extremities normal, atraumatic, no cyanosis or edema Neurologic: Grossly normal Incision/Wound: incisions clean, dry and in tact with no erythema, induration or drainage.  Disposition: Discharge disposition: 01-Home or Self Care       Discharge Instructions     Call MD for:  persistant nausea and vomiting   Complete by: As directed    Call MD for:  redness, tenderness, or signs of infection (pain, swelling, redness, odor or green/yellow discharge around incision site)   Complete by: As directed    Call MD for:  severe uncontrolled pain   Complete by: As directed    Call MD for:  temperature >100.4   Complete by: As directed    Increase activity slowly   Complete by: As directed       Allergies as of 03/02/2021       Reactions   Penicillins    unknown        Medication List     TAKE these medications    amLODipine 5 MG tablet Commonly known as: NORVASC Take 5 mg by mouth daily.   aspirin EC 81 MG tablet Take 81 mg by mouth 2 (two) times a week. Swallow whole.   atorvastatin 10 MG tablet Commonly known as: LIPITOR Take 10 mg by mouth daily.   calcitRIOL 0.25 MCG capsule Commonly known as: ROCALTROL Take 0.5 mcg by mouth daily.   cephALEXin 500 MG capsule Commonly known as: KEFLEX TAKE 1 CAPSULE (500 MG TOTAL) BY MOUTH 3 (THREE) TIMES DAILY FOR 7 DAYS.   cholecalciferol 25 MCG (1000 UNIT) tablet Commonly known as: VITAMIN D Take 1 tablet (1,000  Units total) by mouth daily.   ibuprofen 200 MG tablet Commonly known as: ADVIL Take 400-800 mg by mouth every 6 (six) hours as needed for moderate pain.   levothyroxine 200 MCG tablet Commonly known as: SYNTHROID Take 1 tablet (200 mcg total) by mouth daily.   lisinopril 10 MG tablet Commonly known as: ZESTRIL Take 10 mg by mouth daily.   potassium chloride SA 20 MEQ tablet Commonly known as: KLOR-CON Take 20 mEq by mouth daily.   rifampin 300 MG capsule Commonly known as:  RIFADIN Take 300 mg by mouth. Take 2 capsules by mouth once daily.   traMADol 50 MG tablet Commonly known as: Ultram Take 1 tablet (50 mg total) by mouth every 6 (six) hours as needed for up to 5 days (postoperative pain not controlled with tylenol and ibuprofen first).       ASK your doctor about these medications    Caltrate 600+D Plus Minerals 600-800 MG-UNIT Chew Chew 1,200 mg by mouth 2 (two) times daily.        Follow-up Information     Ileana Roup, MD Follow up in 2 week(s).   Specialties: General Surgery, Colon and Rectal Surgery Why: 2-3 weeks for postoperative appointment Contact information: [place] [address] [phone]                 Signed: Dwan Bolt 03/02/2021, 2:17 PM

## 2021-03-02 NOTE — Progress Notes (Signed)
Assessment unchanged. Helene Kelp RN gave dc instructions earlier and I reinforced follow up care and when to call the doctor with niece. Both verbalized understanding. Discharged via wc to front entrance accompanied by NT and niece.

## 2021-05-20 ENCOUNTER — Other Ambulatory Visit: Payer: Self-pay | Admitting: Family Medicine

## 2021-05-20 DIAGNOSIS — Z720 Tobacco use: Secondary | ICD-10-CM

## 2021-06-27 ENCOUNTER — Encounter: Payer: Self-pay | Admitting: Internal Medicine

## 2021-06-27 ENCOUNTER — Ambulatory Visit (INDEPENDENT_AMBULATORY_CARE_PROVIDER_SITE_OTHER): Payer: Medicare HMO | Admitting: Internal Medicine

## 2021-06-27 ENCOUNTER — Other Ambulatory Visit: Payer: Self-pay

## 2021-06-27 ENCOUNTER — Telehealth: Payer: Self-pay | Admitting: Internal Medicine

## 2021-06-27 VITALS — BP 130/82 | HR 78 | Ht 70.0 in | Wt 188.0 lb

## 2021-06-27 DIAGNOSIS — E890 Postprocedural hypothyroidism: Secondary | ICD-10-CM | POA: Diagnosis not present

## 2021-06-27 DIAGNOSIS — E208 Other hypoparathyroidism: Secondary | ICD-10-CM | POA: Diagnosis not present

## 2021-06-27 DIAGNOSIS — E559 Vitamin D deficiency, unspecified: Secondary | ICD-10-CM | POA: Diagnosis not present

## 2021-06-27 MED ORDER — LEVOTHYROXINE SODIUM 25 MCG PO TABS: 25.0000 ug | ORAL_TABLET | Freq: Every day | ORAL | 3 refills | Status: DC

## 2021-06-27 NOTE — Progress Notes (Signed)
Name: Brett [place]  MRN/ DOB: [id number], Jan 02, 1961    Age/ Sex: 60 y.o., male     PCP: Ferd Hibbs, NP   Reason for Endocrinology Evaluation: Hypocalcemia and Hypothyroidism     Initial Endocrinology Clinic Visit: 11/22/2020    PATIENT IDENTIFIER: 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  He has followed with [facility] since 11/22/2020 for consultative assistance with management of his Hypothyroidism/Hypocalcemia.   HISTORICAL SUMMARY:  Moved from [place]    He is accompanied by his sister Brett [place] and sign language interpreter Brett [place] with sister  HYPOCALCEMIA HISTORY  He was noted with hypocalcemia  Many years ago. Pt is a poor historian and most of the history was obtained from the sister.  Pt is not aware of history of hypocalcemia Per sister he has been diagnosed with this many years ago . He is on 2 Tums TID , and calcitriol 2 a day. NO D3 prescription Has not taken MVI in a couple of weeks   Denies radiation exposure       HYPOTHYROID HISTORY:   S/P total thyroidectomy at age 20 due to goiter . Has been on LT-4 replacement since then. Has history of non compliance requiring hospitalization. Since he has been living with the sister, compliance has improved. He is using a pill box.   Unknown FH of thyroid disease  SUBJECTIVE:    Today (06/27/2021):  Brett [place] is here for Hypocalcemia and Hypothyroidism  Weight has been increasing  Has an abscess in the mouth, pending tooth extraction   His PCP called yesterday telling sister to increase dose of levothyroxine  The labs were drawn 2 days ago  S/P right hemicolectomy 02/2021 due to cecal polyp , continues with diarrhea  Denies palpitations  Has minimal peri-oral tingling that he attributes to mouth abscess  Denies muscle cramps     HOME ENDOCRINE  MEDICATIONS Calcitriol 0.5 mcg BID Caltrate 600/ Vitamin D,  2 tabs BID  Levothyroxine 200 mcg daily      HISTORY:  Past Medical History:  Past Medical History:  Diagnosis Date   Calcium deficiency    Cataract    COPD (chronic obstructive pulmonary disease) ([place])    Deaf    Hypertension    Hypothyroidism    Personal history of colonic polyps 11/03/2020   Scarlet fever    Sleep apnea    no longer uses CPAP   Thyroid disease    Tuberculosis    [place] Health department is contact with him due to hx.   Past Surgical History:  Past Surgical History:  Procedure Laterality Date   CHOLECYSTECTOMY     COLONOSCOPY     +5years   LAPAROSCOPIC RIGHT HEMI COLECTOMY N/A 02/26/2021   Procedure: LAPAROSCOPIC RIGHT HEMI COLECTOMY;  Surgeon: Ileana Roup, MD;  Location: [facility];  Service: General;  Laterality: N/A;   THYROIDECTOMY     Social History:  reports that he has been smoking cigarettes. He has a 11.25 pack-year smoking history. He has never used smokeless tobacco. He reports current alcohol use of about 1.0 - 2.0 standard drink per week. He reports current drug use. Drug: Marijuana. Family History:  Family History  Problem Relation Age of Onset   CAD Mother    Diabetes Mellitus II Mother    Diabetes Mother    CAD Father  Heart attack Father    Heart disease Father    Diabetes Sister    Colon cancer Neg Hx    Esophageal cancer Neg Hx    Stomach cancer Neg Hx    Colon polyps Neg Hx      HOME MEDICATIONS: Allergies as of 06/27/2021       Reactions   Penicillins    unknown        Medication List        Accurate as of June 27, 2021  8:44 AM. If you have any questions, ask your nurse or doctor.          STOP taking these medications    cephALEXin 500 MG capsule Commonly known as: KEFLEX Stopped by: Dorita Sciara, MD   rifampin 300 MG capsule Commonly known as: RIFADIN Stopped by: Dorita Sciara, MD       TAKE these  medications    amLODipine 5 MG tablet Commonly known as: NORVASC Take 5 mg by mouth daily.   aspirin EC 81 MG tablet Take 81 mg by mouth 2 (two) times a week. Swallow whole.   atorvastatin 10 MG tablet Commonly known as: LIPITOR Take 10 mg by mouth daily.   calcitRIOL 0.25 MCG capsule Commonly known as: ROCALTROL Take 0.5 mcg by mouth daily.   Caltrate 600+D Plus Minerals 600-800 MG-UNIT Chew Chew 1,200 mg by mouth 2 (two) times daily. What changed: how much to take   cholecalciferol 25 MCG (1000 UNIT) tablet Commonly known as: VITAMIN D Take 1 tablet (1,000 Units total) by mouth daily.   ibuprofen 200 MG tablet Commonly known as: ADVIL Take 400-800 mg by mouth every 6 (six) hours as needed for moderate pain.   levothyroxine 200 MCG tablet Commonly known as: SYNTHROID Take 1 tablet (200 mcg total) by mouth daily.   lisinopril 10 MG tablet Commonly known as: ZESTRIL Take 10 mg by mouth daily.   potassium chloride SA 20 MEQ tablet Commonly known as: KLOR-CON Take 20 mEq by mouth daily.          OBJECTIVE:   PHYSICAL EXAM: VS: BP 130/82 (BP Location: Left Arm, Patient Position: Sitting, Cuff Size: Small)   Pulse 78   Ht 5\' 10"  (1.778 m)   Wt 188 lb (85.3 kg)   SpO2 96%   BMI 26.98 kg/m    EXAM: General: Pt appears well and is in NAD  Neck: General: Supple without adenopathy. Thyroid:  No goiter or nodules appreciated.   Lungs: Clear with good BS bilat with no rales, rhonchi, or wheezes  Heart: Auscultation: RRR.  Abdomen: Normoactive bowel sounds, soft, nontender, without masses or organomegaly palpable  Extremities:  BL LE: No pretibial edema normal ROM and strength.     DATA REVIEWED: 06/25/2021  Glucose 98 BUN/CR 14/1.5 GFR 53 NA 141 K 4.7 CA 8.5 Albumin 4.8 TSH 9.690       ASSESSMENT / PLAN / RECOMMENDATIONS:   Hypoparathyroidism   The goals of therapy in patients with hypoparathyroidism are to relieve symptoms, to raise and  maintain the serum calcium concentration in the low-normal range( 8.0 to 9 mg/dL), and to prevent iatrogenic development of kidney stones. Attainment of higher serum calcium values is not necessary and is usually limited by the development of hypercalciuria due to the loss of renal calcium-retaining effects of parathyroid hormone .     The major side effects of calcium and vitamin D replacement in patients with hypoparathyroidism are hypercalcemia and hypercalciuria, which,  if chronic, can cause nephrolithiasis, nephrocalcinosis, and renal failure. Hypercalciuria is the earliest sign of toxicity and can develop in the absence of hypercalcemia.    - Labs done at PCP 2 days ago, show acceptable serum calcium, no vitamin D was drawn   Medications :  - Continue  Caltrate 600 mg, 2 tablets with Lunch and 2 tablet with Supper - Continue Calcitriol 0.5 mcg two tablets a day       2. Post Surgical Hypothyroidism:   - No local neck symptoms -Patient admits to imperfect adherence of LT-4 replacement, his THS is slightly elevated -I am not sure at this time if his TSH elevation due to imperfect adherence to levothyroxine or due to reducing levothyroxine dose in the past.  I am going to increase his levothyroxine as below and recheck in 8 weeks   Medications Continue levothyroxine 200 mcg daily Start levothyroxine 25 MCG daily      3. Vitamin D Insufficiency:    - This has normalized in the past, no changes to Vitamin D replacement   - Its part of his caltrate    F/U in 4 months     Signed electronically by: Mack Guise, MD  [facility]  [address] [place], [place] [address] Phone: [phone] FAX: [phone]      CC: Ferd Hibbs, NP [place] [address] Phone: [phone]  Fax: [phone]   Return to Endocrinology clinic as below: No future appointments.

## 2021-06-27 NOTE — Patient Instructions (Signed)
-   Continue Caltrate 600 mg/Vitamin D , 2 tablets with Lunch and 2 tablet with Supper - Continue Calcitriol 0.5 mcg two tablets a day  - Levothyroxine 200 mg , 1 tablet daily      You are on levothyroxine - which is your thyroid hormone supplement. You MUST take this consistently.  You should take this first thing in the morning on an empty stomach with water. You should not take it with other medications. Wait 60min to 1hr prior to eating. If you are taking any vitamins - please take these in the evening.   If you miss a dose, please take your missed dose the following day (double the dose for that day). You should have a pill box for ONLY levothyroxine on your bedside table to help you remember to take your medications.

## 2021-06-27 NOTE — Telephone Encounter (Signed)
Please let the patient or his sister know that I did receive his records from his primary care physician, as the sister told me his thyroid test is off   I would recommend increasing levothyroxine as below  Continue levothyroxine 200 MCG daily Start levothyroxine 25 MCG daily   (She already told me they have levothyroxine 200 mcg prescription at home, and I just prescribed levothyroxine 25 MCG tablets)   Thanks

## 2021-06-30 NOTE — Telephone Encounter (Signed)
Patient sister has been notified of medication change  and verbalized understanding

## 2021-07-29 ENCOUNTER — Other Ambulatory Visit: Payer: Self-pay

## 2021-07-29 ENCOUNTER — Encounter: Payer: Self-pay | Admitting: Nurse Practitioner

## 2021-07-29 ENCOUNTER — Ambulatory Visit (INDEPENDENT_AMBULATORY_CARE_PROVIDER_SITE_OTHER): Payer: Medicare HMO | Admitting: Nurse Practitioner

## 2021-07-29 VITALS — BP 120/81 | HR 74 | Temp 97.4°F | Ht 70.0 in | Wt 191.9 lb

## 2021-07-29 DIAGNOSIS — E782 Mixed hyperlipidemia: Secondary | ICD-10-CM

## 2021-07-29 DIAGNOSIS — Z7689 Persons encountering health services in other specified circumstances: Secondary | ICD-10-CM | POA: Diagnosis not present

## 2021-07-29 DIAGNOSIS — E039 Hypothyroidism, unspecified: Secondary | ICD-10-CM

## 2021-07-29 DIAGNOSIS — Z6827 Body mass index (BMI) 27.0-27.9, adult: Secondary | ICD-10-CM

## 2021-07-29 DIAGNOSIS — I10 Essential (primary) hypertension: Secondary | ICD-10-CM

## 2021-07-29 DIAGNOSIS — E58 Dietary calcium deficiency: Secondary | ICD-10-CM

## 2021-07-29 NOTE — Progress Notes (Signed)
New Patient Office Visit  Subjective:  Patient ID: Brett Campbell, male    DOB: 01-29-1961  Age: 60 y.o. MRN: [id number]  CC:  Chief Complaint  Patient presents with   Establish Care     HPI [name] presents to establish new primary care provider. Having to change proivders so that he has primary care provider inside the [facility]. Was at [facility]. Last had labs done 5 to 6 weeks ago. He does see endocrinologist for severe endocrine deficiency. He had goiter removed and is now on levothyroxine 231mcg daily. Also has calcium deficiency. Is on calcitriol daily as well.  He is due to have annual wellness visit and routine, fasting labs.  He has no concerns or complaints today.   Past Medical History:  Diagnosis Date   Calcium deficiency    Cataract    COPD (chronic obstructive pulmonary disease) ([place])    Deaf    Hypertension    Hypothyroidism    Personal history of colonic polyps 11/03/2020   Scarlet fever    Sleep apnea    no longer uses CPAP   Thyroid disease    Tuberculosis    [place] department is contact with him due to hx.    Past Surgical History:  Procedure Laterality Date   CHOLECYSTECTOMY     COLONOSCOPY     +5years   LAPAROSCOPIC RIGHT HEMI COLECTOMY N/A 02/26/2021   Procedure: LAPAROSCOPIC RIGHT HEMI COLECTOMY;  Surgeon: Ileana Roup, MD;  Location: [facility];  Service: General;  Laterality: N/A;   THYROIDECTOMY      Family History  Problem Relation Age of Onset   CAD Mother    Diabetes Mellitus II Mother    Diabetes Mother    CAD Father    Heart attack Father    Heart disease Father    Diabetes Sister    Colon cancer Neg Hx    Esophageal cancer Neg Hx    Stomach cancer Neg Hx    Colon polyps Neg Hx     Social History   Socioeconomic History   Marital status: Single    Spouse name: Not on file   Number of children: Not on file   Years of education: Not on file   Highest education level: Not on file   Occupational History   Not on file  Tobacco Use   Smoking status: Every Day    Packs/day: 0.25    Years: 45.00    Pack years: 11.25    Types: Cigarettes   Smokeless tobacco: Never   Tobacco comments:    pt states he smokes 1 cigarette per day  Vaping Use   Vaping Use: Never used  Substance and Sexual Activity   Alcohol use: Yes    Alcohol/week: 1.0 - 2.0 standard drink    Types: 1 - 2 Cans of beer per week    Comment: occas   Drug use: Yes    Types: Marijuana   Sexual activity: Not Currently  Other Topics Concern   Not on file  Social History Narrative   Not on file   Social Determinants of Health   Financial Resource Strain: Not on file  Food Insecurity: Not on file  Transportation Needs: Not on file  Physical Activity: Not on file  Stress: Not on file  Social Connections: Not on file  Intimate Partner Violence: Not on file    ROS Review of Systems  Constitutional:  Negative for activity change, chills,  fatigue and fever.  HENT:  Negative for congestion, postnasal drip, rhinorrhea, sinus pressure, sinus pain, sneezing and sore throat.   Eyes: Negative.   Respiratory:  Negative for cough, shortness of breath and wheezing.   Cardiovascular:  Negative for chest pain and palpitations.  Gastrointestinal:  Negative for constipation, diarrhea, nausea and vomiting.  Endocrine: Negative for cold intolerance, heat intolerance, polydipsia and polyuria.       Chronic thyroid disorder and calcium deficiency.   Genitourinary:  Negative for dysuria, frequency and urgency.  Musculoskeletal:  Negative for back pain and myalgias.  Skin:  Negative for rash.  Allergic/Immunologic: Negative for environmental allergies.  Neurological:  Negative for dizziness, weakness and headaches.  Psychiatric/Behavioral:  The patient is not nervous/anxious.    Objective:   Today's Vitals   07/29/21 1110 07/29/21 1132  BP: 120/81   Pulse: 74   Temp: (!) 97.4 F (36.3 C)   SpO2: (!) 88%  98%  Weight: 191 lb 14.4 oz (87 kg)   Height: 5\' 10"  (1.778 m)    Body mass index is 27.53 kg/m.   Physical Exam Vitals and nursing note reviewed.  Constitutional:      Appearance: Normal appearance. He is well-developed.  HENT:     Head: Normocephalic and atraumatic.     Nose: Nose normal.     Mouth/Throat:     Mouth: Mucous membranes are moist.     Pharynx: Oropharynx is clear.  Eyes:     Extraocular Movements: Extraocular movements intact.     Conjunctiva/sclera: Conjunctivae normal.     Pupils: Pupils are equal, round, and reactive to light.  Cardiovascular:     Rate and Rhythm: Normal rate and regular rhythm.     Pulses: Normal pulses.     Heart sounds: Normal heart sounds.  Pulmonary:     Effort: Pulmonary effort is normal.     Breath sounds: Normal breath sounds.  Abdominal:     Palpations: Abdomen is soft.  Musculoskeletal:        General: Normal range of motion.     Cervical back: Normal range of motion and neck supple.  Lymphadenopathy:     Cervical: No cervical adenopathy.  Skin:    General: Skin is warm and dry.     Capillary Refill: Capillary refill takes less than 2 seconds.  Neurological:     General: No focal deficit present.     Mental Status: He is alert and oriented to person, place, and time.  Psychiatric:        Mood and Affect: Mood normal.        Behavior: Behavior normal.        Thought Content: Thought content normal.        Judgment: Judgment normal.    Assessment & Plan:  1. Encounter to establish care Appointment today to establish new primary care provider. Will get medical records from previous PCP to review and update patient chart .  2. Essential hypertension Stable.  Continue blood pressure medication as prescribed.  3. Mixed hyperlipidemia Check fasting lipid panel prior to next visit and adjust atorvastatin dosing as indicated.  4. Acquired hypothyroidism Patient will continue to see endocrinologist as scheduled.  5.  Calcium deficiency Continue calcitriol as prescribed and regular visits with endocrinology as scheduled.  6. Body mass index 27.0-27.9, adult Encourage patient to limit calorie intake to 2000 cal/day or less.  He should consume a low cholesterol, low-fat diet.  Patient  should incorporate exercise into his  daily routine.    Problem List Items Addressed This Visit       Cardiovascular and Mediastinum   Essential hypertension     Endocrine   Acquired hypothyroidism     Other   Mixed hyperlipidemia   Calcium deficiency   Body mass index 27.0-27.9, adult   Other Visit Diagnoses     Encounter to establish care    -  Primary       Outpatient Encounter Medications as of 07/29/2021  Medication Sig   amLODipine (NORVASC) 5 MG tablet Take 5 mg by mouth daily.   aspirin EC 81 MG tablet Take 81 mg by mouth 2 (two) times a week. Swallow whole.   atorvastatin (LIPITOR) 10 MG tablet Take 10 mg by mouth daily.   calcitRIOL (ROCALTROL) 0.25 MCG capsule Take 0.5 mcg by mouth daily.   Calcium Carbonate-Vit D-Min (CALTRATE 600+D PLUS MINERALS) 600-800 MG-UNIT CHEW Chew 1,200 mg by mouth 2 (two) times daily. (Patient taking differently: Chew 2 tablets by mouth 2 (two) times daily.)   cholecalciferol (VITAMIN D) 25 MCG (1000 UNIT) tablet Take 1 tablet (1,000 Units total) by mouth daily.   ibuprofen (ADVIL) 200 MG tablet Take 400-800 mg by mouth every 6 (six) hours as needed for moderate pain.   levothyroxine (SYNTHROID) 200 MCG tablet Take 1 tablet (200 mcg total) by mouth daily.   levothyroxine (SYNTHROID) 25 MCG tablet Take 1 tablet (25 mcg total) by mouth daily.   lisinopril (ZESTRIL) 10 MG tablet Take 10 mg by mouth daily.   potassium chloride SA (KLOR-CON) 20 MEQ tablet Take 20 mEq by mouth daily.   No facility-administered encounter medications on file as of 07/29/2021.    Follow-up: Return in about 6 weeks (around 09/09/2021) for medicare wellness, FBW a week prior to visit add PSA - see  [facility] .   [name], NP  This note was dictated using Systems analyst. Rapid proofreading was performed to expedite the delivery of the information. Despite proofreading, phonetic errors will occur which are common with this voice recognition software. Please take this into consideration. If there are any concerns, please contact our office.

## 2021-08-03 DIAGNOSIS — Z6826 Body mass index (BMI) 26.0-26.9, adult: Secondary | ICD-10-CM | POA: Insufficient documentation

## 2021-08-03 DIAGNOSIS — E782 Mixed hyperlipidemia: Secondary | ICD-10-CM | POA: Insufficient documentation

## 2021-08-03 DIAGNOSIS — Z6827 Body mass index (BMI) 27.0-27.9, adult: Secondary | ICD-10-CM | POA: Insufficient documentation

## 2021-08-03 DIAGNOSIS — E58 Dietary calcium deficiency: Secondary | ICD-10-CM | POA: Insufficient documentation

## 2021-08-03 DIAGNOSIS — I10 Essential (primary) hypertension: Secondary | ICD-10-CM | POA: Insufficient documentation

## 2021-08-03 NOTE — Patient Instructions (Signed)
Fat and Cholesterol Restricted Eating Plan Getting too much fat and cholesterol in your diet may cause health problems. Choosing the right foods helps keep your fat and cholesterol at normal levels. This can keep you from getting certain diseases. Your doctor may recommend an eating plan that includes: Total fat: ______% or less of total calories a day. This is ______g of fat a day. Saturated fat: ______% or less of total calories a day. This is ______g of saturated fat a day. Cholesterol: less than _________mg a day. Fiber: ______g a day. What are tips for following this plan? General tips Work with your doctor to lose weight if you need to. Avoid: Foods with added sugar. Fried foods. Foods with trans fat or partially hydrogenated oils. This includes some margarines and baked goods. If you drink alcohol: Limit how much you have to: 0-1 drink a day for women who are not pregnant. 0-2 drinks a day for men. Know how much alcohol is in a drink. In the [place] one drink equals one 12 oz bottle of beer (355 mL), one 5 oz glass of wine (148 mL), or one 1 oz glass of hard liquor (44 mL). Reading food labels Check food labels for: Trans fats. Partially hydrogenated oils. Saturated fat (g) in each serving. Cholesterol (mg) in each serving. Fiber (g) in each serving. Choose foods with healthy fats, such as: Monounsaturated fats and polyunsaturated fats. These include olive and canola oil, flaxseeds, walnuts, almonds, and seeds. Omega-3 fats. These are found in certain fish, flaxseed oil, and ground flaxseeds. Choose grain products that have whole grains. Look for the word "whole" as the first word in the ingredient list. Cooking Cook foods using low-fat methods. These include baking, boiling, grilling, and broiling. Eat more home-cooked foods. Eat at restaurants and buffets less often. Eat less fast food. Avoid cooking using saturated fats, such as butter, cream, palm oil, palm kernel oil, and  coconut oil. Meal planning  At meals, divide your plate into four equal parts: Fill one-half of your plate with vegetables, green salads, and fruit. Fill one-fourth of your plate with whole grains. Fill one-fourth of your plate with low-fat (lean) protein foods. Eat fish that is high in omega-3 fats at least two times a week. This includes mackerel, tuna, sardines, and salmon. Eat foods that are high in fiber, such as whole grains, beans, apples, pears, berries, broccoli, carrots, peas, and barley. What foods should I eat? Fruits All fresh, canned (in natural juice), or frozen fruits. Vegetables Fresh or frozen vegetables (raw, steamed, roasted, or grilled). Green salads. Grains Whole grains, such as whole wheat or whole grain breads, crackers, cereals, and pasta. Unsweetened oatmeal, bulgur, barley, quinoa, or brown rice. Corn or whole wheat flour tortillas. Meats and other protein foods Ground beef (85% or leaner), grass-fed beef, or beef trimmed of fat. Skinless chicken or turkey. Ground chicken or turkey. Pork trimmed of fat. All fish and seafood. Egg whites. Dried beans, peas, or lentils. Unsalted nuts or seeds. Unsalted canned beans. Nut butters without added sugar or oil. Dairy Low-fat or nonfat dairy products, such as skim or 1% milk, 2% or reduced-fat cheeses, low-fat and fat-free ricotta or cottage cheese, or plain low-fat and nonfat yogurt. Fats and oils Tub margarine without trans fats. Light or reduced-fat mayonnaise and salad dressings. Avocado. Olive, canola, sesame, or safflower oils. The items listed above may not be a complete list of foods and beverages you can eat. Contact a dietitian for more information. What foods   should I avoid? Fruits Canned fruit in heavy syrup. Fruit in cream or butter sauce. Fried fruit. Vegetables Vegetables cooked in cheese, cream, or butter sauce. Fried vegetables. Grains White bread. White pasta. White rice. Cornbread. Bagels, pastries,  and croissants. Crackers and snack foods that contain trans fat and hydrogenated oils. Meats and other protein foods Fatty cuts of meat. Ribs, chicken wings, bacon, sausage, bologna, salami, chitterlings, fatback, hot dogs, bratwurst, and packaged lunch meats. Liver and organ meats. Whole eggs and egg yolks. Chicken and turkey with skin. Fried meat. Dairy Whole or 2% milk, cream, half-and-half, and cream cheese. Whole milk cheeses. Whole-fat or sweetened yogurt. Full-fat cheeses. Nondairy creamers and whipped toppings. Processed cheese, cheese spreads, and cheese curds. Fats and oils Butter, stick margarine, lard, shortening, ghee, or bacon fat. Coconut, palm kernel, and palm oils. Beverages Alcohol. Sugar-sweetened drinks such as sodas, lemonade, and fruit drinks. Sweets and desserts Corn syrup, sugars, honey, and molasses. Candy. Jam and jelly. Syrup. Sweetened cereals. Cookies, pies, cakes, donuts, muffins, and ice cream. The items listed above may not be a complete list of foods and beverages you should avoid. Contact a dietitian for more information. Summary Choosing the right foods helps keep your fat and cholesterol at normal levels. This can keep you from getting certain diseases. At meals, fill one-half of your plate with vegetables, green salads, and fruits. Eat high fiber foods, like whole grains, beans, apples, pears, berries, carrots, peas, and barley. Limit added sugar, saturated fats, alcohol, and fried foods. This information is not intended to replace advice given to you by your health care provider. Make sure you discuss any questions you have with your health care provider. Document Revised: 12/20/2020 Document Reviewed: 12/20/2020 Elsevier Patient Education  2022 [employer].  

## 2021-08-22 ENCOUNTER — Other Ambulatory Visit: Payer: Medicare HMO

## 2021-08-27 ENCOUNTER — Other Ambulatory Visit: Payer: Self-pay

## 2021-08-27 ENCOUNTER — Other Ambulatory Visit (INDEPENDENT_AMBULATORY_CARE_PROVIDER_SITE_OTHER): Payer: Medicare HMO

## 2021-08-27 DIAGNOSIS — E559 Vitamin D deficiency, unspecified: Secondary | ICD-10-CM | POA: Diagnosis not present

## 2021-08-27 DIAGNOSIS — E890 Postprocedural hypothyroidism: Secondary | ICD-10-CM

## 2021-08-27 LAB — TSH: TSH: 0.01 u[IU]/mL — ABNORMAL LOW (ref 0.35–5.50)

## 2021-08-27 LAB — VITAMIN D 25 HYDROXY (VIT D DEFICIENCY, FRACTURES): VITD: 35.73 ng/mL (ref 30.00–100.00)

## 2021-08-28 ENCOUNTER — Telehealth: Payer: Self-pay | Admitting: Internal Medicine

## 2021-08-28 ENCOUNTER — Other Ambulatory Visit: Payer: Self-pay

## 2021-08-28 DIAGNOSIS — Z0000 Encounter for general adult medical examination without abnormal findings: Secondary | ICD-10-CM

## 2021-08-28 DIAGNOSIS — Z125 Encounter for screening for malignant neoplasm of prostate: Secondary | ICD-10-CM

## 2021-08-28 DIAGNOSIS — Z7689 Persons encountering health services in other specified circumstances: Secondary | ICD-10-CM

## 2021-08-28 MED ORDER — LEVOTHYROXINE SODIUM 175 MCG PO TABS: 175.0000 ug | ORAL_TABLET | Freq: Every day | ORAL | 1 refills | Status: DC

## 2021-08-28 NOTE — Telephone Encounter (Signed)
Patient sister [name] has been notified and verbalized understanding

## 2021-08-28 NOTE — Telephone Encounter (Signed)
Please let the sister know that patient's thyroid still shows he is on TOO much levothyroxine     Please STOP levothyroxine 200 mcg and START 175 mcg daily     Thanks

## 2021-09-02 ENCOUNTER — Other Ambulatory Visit: Payer: Self-pay

## 2021-09-02 ENCOUNTER — Other Ambulatory Visit: Payer: Medicare HMO

## 2021-09-02 DIAGNOSIS — Z0000 Encounter for general adult medical examination without abnormal findings: Secondary | ICD-10-CM

## 2021-09-02 DIAGNOSIS — Z7689 Persons encountering health services in other specified circumstances: Secondary | ICD-10-CM | POA: Diagnosis not present

## 2021-09-02 DIAGNOSIS — Z125 Encounter for screening for malignant neoplasm of prostate: Secondary | ICD-10-CM

## 2021-09-03 LAB — CBC WITH DIFFERENTIAL/PLATELET
Basophils Absolute: 0.1 10*3/uL (ref 0.0–0.2)
Basos: 1 %
EOS (ABSOLUTE): 0.6 10*3/uL — ABNORMAL HIGH (ref 0.0–0.4)
Eos: 5 %
Hematocrit: 45.7 % (ref 37.5–51.0)
Hemoglobin: 15.6 g/dL (ref 13.0–17.7)
Immature Grans (Abs): 0 10*3/uL (ref 0.0–0.1)
Immature Granulocytes: 0 %
Lymphocytes Absolute: 2.9 10*3/uL (ref 0.7–3.1)
Lymphs: 21 %
MCH: 29.4 pg (ref 26.6–33.0)
MCHC: 34.1 g/dL (ref 31.5–35.7)
MCV: 86 fL (ref 79–97)
Monocytes Absolute: 1 10*3/uL — ABNORMAL HIGH (ref 0.1–0.9)
Monocytes: 8 %
Neutrophils Absolute: 9 10*3/uL — ABNORMAL HIGH (ref 1.4–7.0)
Neutrophils: 65 %
Platelets: 255 10*3/uL (ref 150–450)
RBC: 5.3 x10E6/uL (ref 4.14–5.80)
RDW: 11.7 % (ref 11.6–15.4)
WBC: 13.7 10*3/uL — ABNORMAL HIGH (ref 3.4–10.8)

## 2021-09-03 LAB — COMPREHENSIVE METABOLIC PANEL
ALT: 42 IU/L (ref 0–44)
AST: 28 IU/L (ref 0–40)
Albumin/Globulin Ratio: 2 (ref 1.2–2.2)
Albumin: 4.7 g/dL (ref 3.8–4.9)
Alkaline Phosphatase: 100 IU/L (ref 44–121)
BUN/Creatinine Ratio: 13 (ref 9–20)
BUN: 15 mg/dL (ref 6–24)
Bilirubin Total: 0.4 mg/dL (ref 0.0–1.2)
CO2: 22 mmol/L (ref 20–29)
Calcium: 10 mg/dL (ref 8.7–10.2)
Chloride: 103 mmol/L (ref 96–106)
Creatinine, Ser: 1.18 mg/dL (ref 0.76–1.27)
Globulin, Total: 2.4 g/dL (ref 1.5–4.5)
Glucose: 102 mg/dL — ABNORMAL HIGH (ref 70–99)
Potassium: 4.5 mmol/L (ref 3.5–5.2)
Sodium: 140 mmol/L (ref 134–144)
Total Protein: 7.1 g/dL (ref 6.0–8.5)
eGFR: 71 mL/min/{1.73_m2} (ref >59)

## 2021-09-03 LAB — LIPID PANEL
Chol/HDL Ratio: 4.3 ratio (ref 0.0–5.0)
Cholesterol, Total: 141 mg/dL (ref 100–199)
HDL: 33 mg/dL — ABNORMAL LOW (ref >39)
LDL Chol Calc (NIH): 72 mg/dL (ref 0–99)
Triglycerides: 217 mg/dL — ABNORMAL HIGH (ref 0–149)
VLDL Cholesterol Cal: 36 mg/dL (ref 5–40)

## 2021-09-03 LAB — HEMOGLOBIN A1C
Est. average glucose Bld gHb Est-mCnc: 131 mg/dL
Hgb A1c MFr Bld: 6.2 % — ABNORMAL HIGH (ref 4.8–5.6)

## 2021-09-03 LAB — PSA: Prostate Specific Ag, Serum: 0.6 ng/mL (ref 0.0–4.0)

## 2021-09-03 LAB — TSH: TSH: 0.007 u[IU]/mL — ABNORMAL LOW (ref 0.450–4.500)

## 2021-09-03 NOTE — Progress Notes (Signed)
Patient sees endocrinology for thyroid and endocrine deficiency. Will discuss elevated but stable WBC at CPE next week.

## 2021-09-10 ENCOUNTER — Encounter: Payer: Medicare HMO | Admitting: Nurse Practitioner

## 2021-09-16 ENCOUNTER — Encounter: Payer: Medicare HMO | Admitting: Nurse Practitioner

## 2021-09-23 ENCOUNTER — Ambulatory Visit (INDEPENDENT_AMBULATORY_CARE_PROVIDER_SITE_OTHER): Payer: Medicare HMO | Admitting: Nurse Practitioner

## 2021-09-23 ENCOUNTER — Encounter: Payer: Self-pay | Admitting: Nurse Practitioner

## 2021-09-23 ENCOUNTER — Other Ambulatory Visit: Payer: Self-pay

## 2021-09-23 VITALS — BP 103/62 | HR 71 | Temp 97.9°F | Ht 70.0 in | Wt 186.6 lb

## 2021-09-23 DIAGNOSIS — Z0000 Encounter for general adult medical examination without abnormal findings: Secondary | ICD-10-CM | POA: Diagnosis not present

## 2021-09-23 DIAGNOSIS — Z23 Encounter for immunization: Secondary | ICD-10-CM | POA: Diagnosis not present

## 2021-09-23 DIAGNOSIS — E876 Hypokalemia: Secondary | ICD-10-CM

## 2021-09-23 DIAGNOSIS — E782 Mixed hyperlipidemia: Secondary | ICD-10-CM

## 2021-09-23 DIAGNOSIS — F17209 Nicotine dependence, unspecified, with unspecified nicotine-induced disorders: Secondary | ICD-10-CM | POA: Diagnosis not present

## 2021-09-23 DIAGNOSIS — Z6826 Body mass index (BMI) 26.0-26.9, adult: Secondary | ICD-10-CM | POA: Diagnosis not present

## 2021-09-23 DIAGNOSIS — E039 Hypothyroidism, unspecified: Secondary | ICD-10-CM | POA: Diagnosis not present

## 2021-09-23 DIAGNOSIS — I10 Essential (primary) hypertension: Secondary | ICD-10-CM

## 2021-09-23 MED ORDER — LISINOPRIL 10 MG PO TABS: 10.0000 mg | ORAL_TABLET | Freq: Every day | ORAL | 3 refills | Status: DC

## 2021-09-23 MED ORDER — CALCITRIOL 0.25 MCG PO CAPS: 0.5000 ug | ORAL_CAPSULE | Freq: Every day | ORAL | 3 refills | Status: DC

## 2021-09-23 MED ORDER — AMLODIPINE BESYLATE 5 MG PO TABS: 5.0000 mg | ORAL_TABLET | Freq: Every day | ORAL | 3 refills | Status: DC

## 2021-09-23 MED ORDER — ATORVASTATIN CALCIUM 10 MG PO TABS: 10.0000 mg | ORAL_TABLET | Freq: Every day | ORAL | 3 refills | Status: DC

## 2021-09-23 MED ORDER — POTASSIUM CHLORIDE CRYS ER 20 MEQ PO TBCR: 20.0000 meq | EXTENDED_RELEASE_TABLET | Freq: Every day | ORAL | 3 refills | Status: DC

## 2021-09-23 NOTE — Progress Notes (Signed)
Subjective:   Brett Campbell is a 61 y.o. male who presents for Medicare Annual/Subsequent preventive examination.  He had labs done prior to this visit.  Thyroid appears to be overactive.  He does see endocrinology for this.  Blood sugar mildly elevated. Hemoglobin A1c is 6.2.  His blood pressure is well managed.  He is due for shingles vaccine 2.  Has no other concerns or complaints.  Review of Systems    Review of Systems  Constitutional:  Negative for fever, malaise/fatigue and weight loss.  HENT:  Negative for congestion, ear discharge, ear pain, hearing loss and sore throat.   Eyes: Negative.   Respiratory:  Negative for cough, shortness of breath and wheezing.   Cardiovascular:  Negative for chest pain and orthopnea.  Gastrointestinal:  Negative for abdominal pain, blood in stool, constipation, diarrhea, nausea and vomiting.  Genitourinary:  Negative for dysuria, flank pain, frequency and urgency.  Musculoskeletal:  Negative for back pain and myalgias.  Skin:  Negative for itching and rash.  Neurological:  Negative for dizziness, weakness and headaches.  Endo/Heme/Allergies:        Has history of hyperthyroid.  Has surgical removal of thyroid several years ago.  Has been on thyroid replacement ever since  Psychiatric/Behavioral:  Negative for depression.          Objective:    Today's Vitals   09/23/21 1420  BP: 103/62  Pulse: 71  Temp: 97.9 F (36.6 C)  SpO2: 97%  Weight: 186 lb 9.6 oz (84.6 kg)  Height: 5\' 10"  (1.778 m)   Body mass index is 26.77 kg/m.  Physical Exam Vitals and nursing note reviewed.  Constitutional:      Appearance: Normal appearance. He is well-developed.  HENT:     Head: Normocephalic and atraumatic.     Right Ear: Tympanic membrane, ear canal and external ear normal.     Left Ear: Tympanic membrane, ear canal and external ear normal.     Nose: Nose normal.     Mouth/Throat:     Mouth: Mucous membranes are moist.     Pharynx: Oropharynx  is clear.  Eyes:     Extraocular Movements: Extraocular movements intact.     Conjunctiva/sclera: Conjunctivae normal.     Pupils: Pupils are equal, round, and reactive to light.  Neck:     Vascular: No carotid bruit.  Cardiovascular:     Rate and Rhythm: Normal rate and regular rhythm.     Pulses: Normal pulses.     Heart sounds: Normal heart sounds.  Pulmonary:     Effort: Pulmonary effort is normal.     Breath sounds: Normal breath sounds.  Abdominal:     General: Bowel sounds are normal. There is no distension.     Palpations: Abdomen is soft. There is no mass.     Tenderness: There is no abdominal tenderness. There is no guarding or rebound.     Hernia: No hernia is present.  Musculoskeletal:        General: Normal range of motion.     Cervical back: Normal range of motion and neck supple.  Lymphadenopathy:     Cervical: No cervical adenopathy.  Skin:    General: Skin is warm and dry.     Capillary Refill: Capillary refill takes less than 2 seconds.  Neurological:     General: No focal deficit present.     Mental Status: He is alert and oriented to person, place, and time. Mental status is at  baseline.  Psychiatric:        Mood and Affect: Mood normal.        Behavior: Behavior normal.        Thought Content: Thought content normal.        Judgment: Judgment normal.    Advanced Directives 02/26/2021 02/17/2021 08/29/2020  Does Patient Have a Medical Advance Directive? No No No  Would patient like information on creating a medical advance directive? No - Patient declined - -    Current Medications (verified) Outpatient Encounter Medications as of 09/23/2021  Medication Sig   amLODipine (NORVASC) 5 MG tablet Take 1 tablet (5 mg total) by mouth daily.   atorvastatin (LIPITOR) 10 MG tablet Take 1 tablet (10 mg total) by mouth daily.   calcitRIOL (ROCALTROL) 0.25 MCG capsule Take 2 capsules (0.5 mcg total) by mouth daily.   Calcium Carbonate-Vit D-Min (CALTRATE 600+D PLUS  MINERALS) 600-800 MG-UNIT CHEW Chew 1,200 mg by mouth 2 (two) times daily. (Patient taking differently: Chew 2 tablets by mouth 2 (two) times daily.)   ibuprofen (ADVIL) 200 MG tablet Take 400-800 mg by mouth every 6 (six) hours as needed for moderate pain.   levothyroxine (SYNTHROID) 175 MCG tablet Take 1 tablet (175 mcg total) by mouth daily.   lisinopril (ZESTRIL) 10 MG tablet Take 1 tablet (10 mg total) by mouth daily.   potassium chloride SA (KLOR-CON M) 20 MEQ tablet Take 1 tablet (20 mEq total) by mouth daily.   [DISCONTINUED] amLODipine (NORVASC) 5 MG tablet Take 5 mg by mouth daily.   [DISCONTINUED] aspirin EC 81 MG tablet Take 81 mg by mouth 2 (two) times a week. Swallow whole.   [DISCONTINUED] atorvastatin (LIPITOR) 10 MG tablet Take 10 mg by mouth daily.   [DISCONTINUED] calcitRIOL (ROCALTROL) 0.25 MCG capsule Take 0.5 mcg by mouth daily.   [DISCONTINUED] cholecalciferol (VITAMIN D) 25 MCG (1000 UNIT) tablet Take 1 tablet (1,000 Units total) by mouth daily.   [DISCONTINUED] lisinopril (ZESTRIL) 10 MG tablet Take 10 mg by mouth daily.   [DISCONTINUED] potassium chloride SA (KLOR-CON) 20 MEQ tablet Take 20 mEq by mouth daily.   No facility-administered encounter medications on file as of 09/23/2021.    Allergies (verified) Penicillins   History: Past Medical History:  Diagnosis Date   Calcium deficiency    Cataract    COPD (chronic obstructive pulmonary disease) ([place])    Deaf    Hypertension    Hypothyroidism    Personal history of colonic polyps 11/03/2020   Scarlet fever    Sleep apnea    no longer uses CPAP   Thyroid disease    Tuberculosis    [place] Health department is contact with him due to hx.   Past Surgical History:  Procedure Laterality Date   CHOLECYSTECTOMY     COLONOSCOPY     +5years   LAPAROSCOPIC RIGHT HEMI COLECTOMY N/A 02/26/2021   Procedure: LAPAROSCOPIC RIGHT HEMI COLECTOMY;  Surgeon: Ileana Roup, MD;  Location: [facility];  Service: General;   Laterality: N/A;   THYROIDECTOMY     Family History  Problem Relation Age of Onset   CAD Mother    Diabetes Mellitus II Mother    Diabetes Mother    CAD Father    Heart attack Father    Heart disease Father    Diabetes Sister    Colon cancer Neg Hx    Esophageal cancer Neg Hx    Stomach cancer Neg Hx    Colon polyps Neg Hx  Social History   Socioeconomic History   Marital status: Single    Spouse name: Not on file   Number of children: Not on file   Years of education: Not on file   Highest education level: Not on file  Occupational History   Not on file  Tobacco Use   Smoking status: Every Day    Packs/day: 0.25    Years: 45.00    Pack years: 11.25    Types: Cigarettes   Smokeless tobacco: Never   Tobacco comments:    pt states he smokes 1 cigarette per day  Vaping Use   Vaping Use: Never used  Substance and Sexual Activity   Alcohol use: Yes    Alcohol/week: 1.0 - 2.0 standard drink    Types: 1 - 2 Cans of beer per week    Comment: occas   Drug use: Yes    Types: Marijuana   Sexual activity: Not Currently  Other Topics Concern   Not on file  Social History Narrative   Not on file   Social Determinants of Health   Financial Resource Strain: Not on file  Food Insecurity: Not on file  Transportation Needs: Not on file  Physical Activity: Not on file  Stress: Not on file  Social Connections: Not on file    Tobacco Counseling Ready to quit: Not Answered Counseling given: Not Answered Tobacco comments: pt states he smokes 1 cigarette per day   Diabetic?no   Activities of Daily Living In your present state of health, do you have any difficulty performing the following activities: 09/23/2021 07/29/2021  Hearing? Tempie Donning  Vision? N Y  Difficulty concentrating or making decisions? N Y  Walking or climbing stairs? N N  Dressing or bathing? N N  Doing errands, shopping? N N  Some recent data might be hidden    Patient Care Team: [name],  NP as PCP - General (Family Medicine)  Indicate any recent Medical Services you may have received from other than [facility] providers in the past year (date may be approximate).     Assessment:  1. Encounter for Medicare annual wellness exam Annual Medicare wellness visit.    2. Hypokalemia Potassium level stable.  Continue potassium 20 mEq daily.  New prescription sent to pharmacy. - potassium chloride SA (KLOR-CON M) 20 MEQ tablet; Take 1 tablet (20 mEq total) by mouth daily.  Dispense: 90 tablet; Refill: 3  3. Essential hypertension Stable.  Continue amlodipine and lisinopril as prescribed.  New prescription sent to his pharmacy today. - amLODipine (NORVASC) 5 MG tablet; Take 1 tablet (5 mg total) by mouth daily.  Dispense: 90 tablet; Refill: 3 - lisinopril (ZESTRIL) 10 MG tablet; Take 1 tablet (10 mg total) by mouth daily.  Dispense: 90 tablet; Refill: 3  4. Hypocalcemia Continue calcitriol as prescribed. - calcitRIOL (ROCALTROL) 0.25 MCG capsule; Take 2 capsules (0.5 mcg total) by mouth daily.  Dispense: 90 capsule; Refill: 3  5. Acquired hypothyroidism Continue regular visits with endocrinology as scheduled.  6. Mixed hyperlipidemia Continue atorvastatin as prescribed.  Recommend he limit intake of fried and fatty foods.  Should increase intake of lean and healthy proteins and green leafy vegetables.  Recheck in 1 year. - atorvastatin (LIPITOR) 10 MG tablet; Take 1 tablet (10 mg total) by mouth daily.  Dispense: 90 tablet; Refill: 3  7. Need for shingles vaccine Second shingles vaccine administered during today's visit. - Varicella-zoster vaccine IM  8. Body mass index 26.0-26.9, adult Encourage patient  to limit calorie intake to 2000 cal/day or less.  He should consume a low cholesterol, low-fat diet.    9. Tobacco use disorder, continuous Patient continues to smoke approximately 1 cigarette/day.  Plans to try cutting this out.  We will reassess at next visit.  Hearing/Vision  screen No results found.   Depression Screen PHQ 2/9 Scores 09/23/2021 07/29/2021  PHQ - 2 Score 0 2  PHQ- 9 Score 2 6    Fall Risk Fall Risk  09/23/2021 07/29/2021  Falls in the past year? 0 0  Number falls in past yr: 0 0  Injury with Fall? 0 0  Follow up Falls evaluation completed Falls evaluation completed    [place]:  Any stairs in or around the home? No  If so, are there any without handrails? No  Home free of loose throw rugs in walkways, pet beds, electrical cords, etc? Yes  Adequate lighting in your home to reduce risk of falls? Yes   ASSISTIVE DEVICES UTILIZED TO PREVENT FALLS:  Life alert? No  Use of a cane, walker or w/c? No  Grab bars in the bathroom? Yes  Shower chair or bench in shower? No  Elevated toilet seat or a handicapped toilet? No   TIMED UP AND GO:  Was the test performed? Yes .  Length of time to ambulate 10 feet: 10 sec.   Gait steady and fast without use of assistive device  Cognitive Function:     6CIT Screen 09/23/2021  What Year? 0 points  What month? 0 points  What time? 0 points  Count back from 20 4 points  Months in reverse 2 points  Repeat phrase 2 points  Total Score 8    Immunizations Immunization History  Administered Date(s) Administered   Influenza,inj,Quad PF,6+ Mos 03/24/2021   Tdap 05/24/2021   Zoster Recombinat (Shingrix) 09/23/2021    TDAP status: Up to date  Flu Vaccine status: Up to date  Pneumococcal vaccine status: Up to date  Covid-19 vaccine status: Declined, Education has been provided regarding the importance of this vaccine but patient still declined. Advised may receive this vaccine at local pharmacy or Health Dept.or vaccine clinic. Aware to provide a copy of the vaccination record if obtained from local pharmacy or Health Dept. Verbalized acceptance and understanding.  Qualifies for Shingles Vaccine? Yes   Zostavax completed Yes   Shingrix Completed?:  Yes  Screening Tests Health Maintenance  Topic Date Due   COVID-19 Vaccine (1) Never done   HIV Screening  Never done   Hepatitis C Screening  Never done   Zoster Vaccines- Shingrix (2 of 2) 11/18/2021   COLONOSCOPY (Pts 45-54yrs Insurance coverage will need to be confirmed)  10/21/2025   TETANUS/TDAP  05/25/2031   INFLUENZA VACCINE  Completed   HPV VACCINES  Aged Out    Health Maintenance  Health Maintenance Due  Topic Date Due   COVID-19 Vaccine (1) Never done   HIV Screening  Never done   Hepatitis C Screening  Never done    Colorectal cancer screening: Type of screening: Colonoscopy. Completed 2022. Repeat every 6 month years  Lung Cancer Screening: (Low Dose CT Chest recommended if Age 52-80 years, 30 pack-year currently smoking OR have quit w/in 15years.) does not qualify.   Lung Cancer Screening Referral: no  Additional Screening:  Hepatitis C Screening: does not qualify; Completed no  Vision Screening: Recommended annual ophthalmology exams for early detection of glaucoma and other disorders of  the eye. Is the patient up to date with their annual eye exam?  Yes  Who is the provider or what is the name of the office in which the patient attends annual eye exams? N/a If pt is not established with a provider, would they like to be referred to a provider to establish care? No .   Dental Screening: Recommended annual dental exams for proper oral hygiene  Community Resource Referral / Chronic Care Management: CRR required this visit?  No   CCM required this visit?  No      Plan:     I have personally reviewed and noted the following in the patients chart:   Medical and social history Use of alcohol, tobacco or illicit drugs  Current medications and supplements including opioid prescriptions. Patient is not currently taking opioid prescriptions. Functional ability and status Nutritional status Physical activity Advanced directives List of other  physicians Hospitalizations, surgeries, and ER visits in previous 12 months Vitals Screenings to include cognitive, depression, and falls Referrals and appointments  In addition, I have reviewed and discussed with patient certain preventive protocols, quality metrics, and best practice recommendations. A written personalized care plan for preventive services as well as general preventive health recommendations were provided to patient.     Leretha Pol, FNP-c 09/28/2021

## 2021-09-28 DIAGNOSIS — Z23 Encounter for immunization: Secondary | ICD-10-CM | POA: Insufficient documentation

## 2021-09-28 DIAGNOSIS — E876 Hypokalemia: Secondary | ICD-10-CM | POA: Insufficient documentation

## 2021-09-28 DIAGNOSIS — F17209 Nicotine dependence, unspecified, with unspecified nicotine-induced disorders: Secondary | ICD-10-CM | POA: Insufficient documentation

## 2021-12-13 IMAGING — CT CT RENAL STONE PROTOCOL
2 of 4 series · 15 of 46 positions shown, 17 images · non-contrast
Comparison: None.

CLINICAL DATA: Right-sided flank pain

EXAM:
CT ABDOMEN AND PELVIS WITHOUT CONTRAST
TECHNIQUE: Multidetector CT imaging of the abdomen and pelvis was performed
following the standard protocol without oral or IV contrast.

[Series 2: axial st · axial · 0.89mm/px · z∈[-521,-61]mm · 12 of 106 slices shown, 14 images]
[im 9/106  soft-tissue]
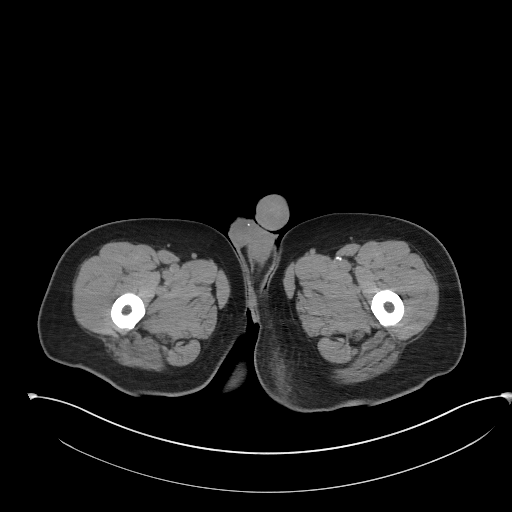
[im 9/106  bone]
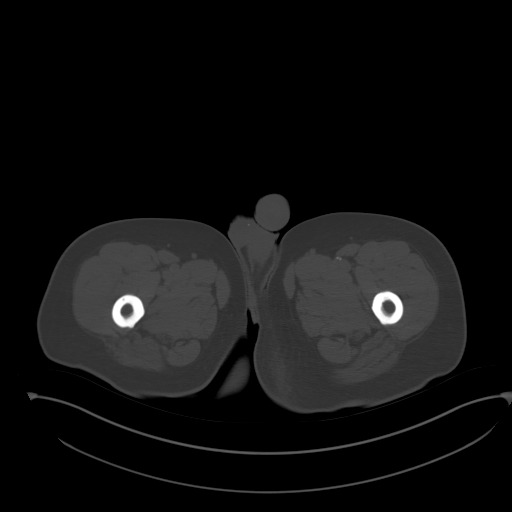
[im 17/106  soft-tissue]
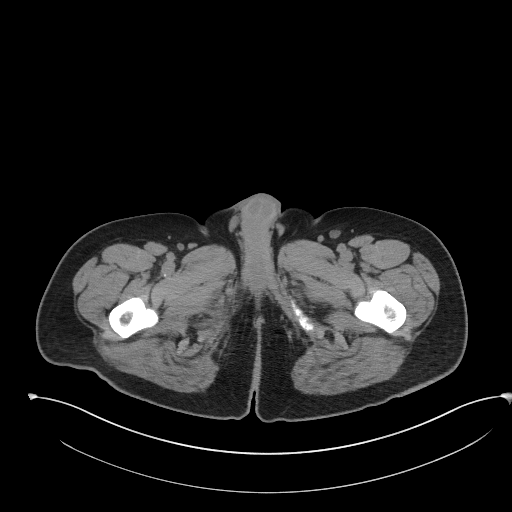
[im 26/106  soft-tissue]
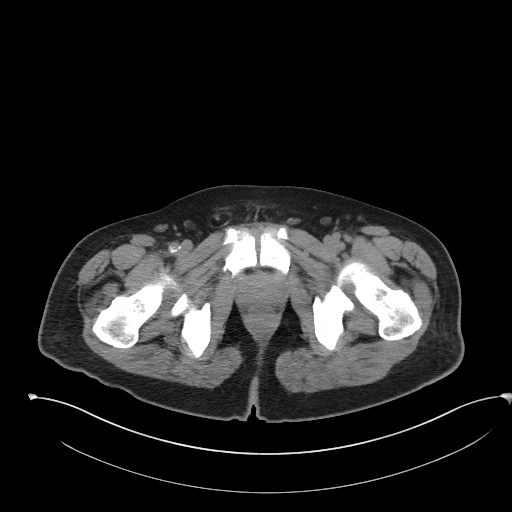
[im 34/106  soft-tissue]
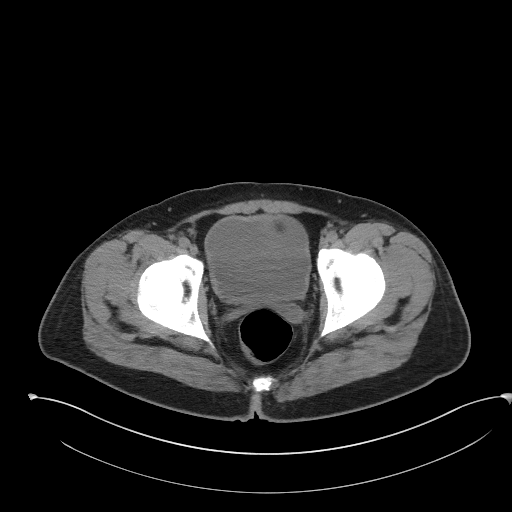
[im 43/106  soft-tissue]
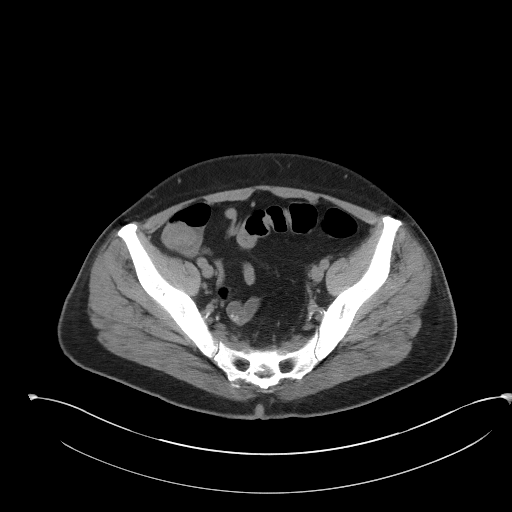
[im 51/106  soft-tissue]
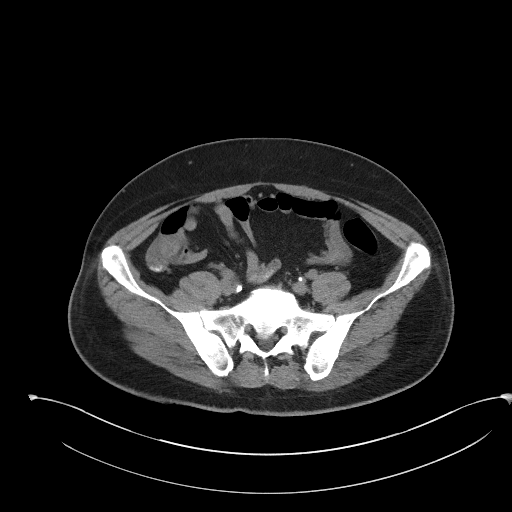
[im 59/106  soft-tissue]
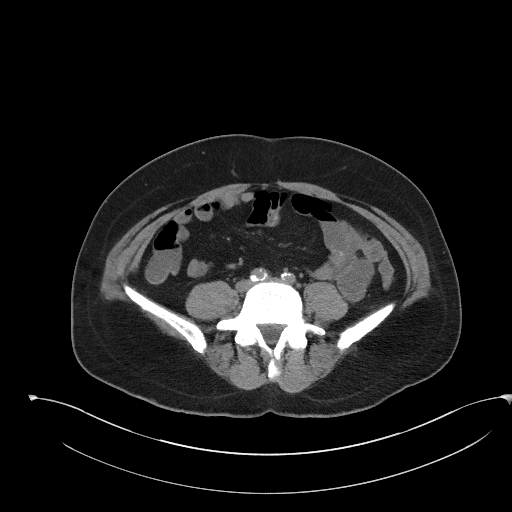
[im 68/106  soft-tissue]
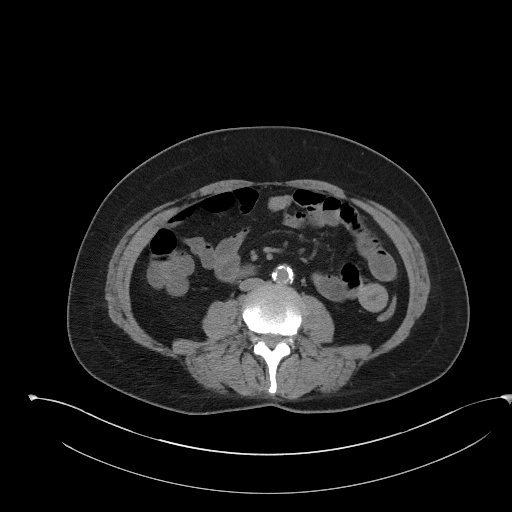
[im 76/106  soft-tissue]
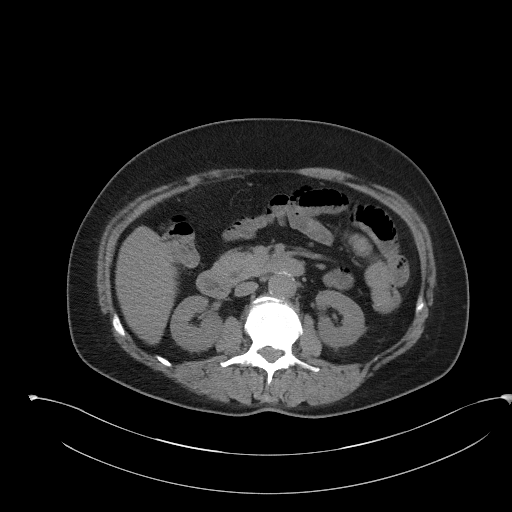
[im 76/106  bone]
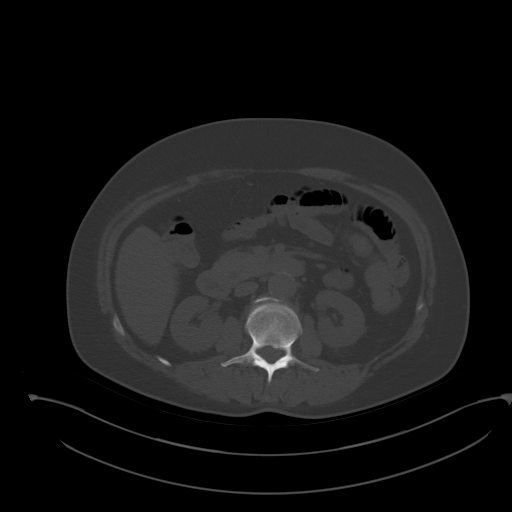
[im 85/106  soft-tissue]
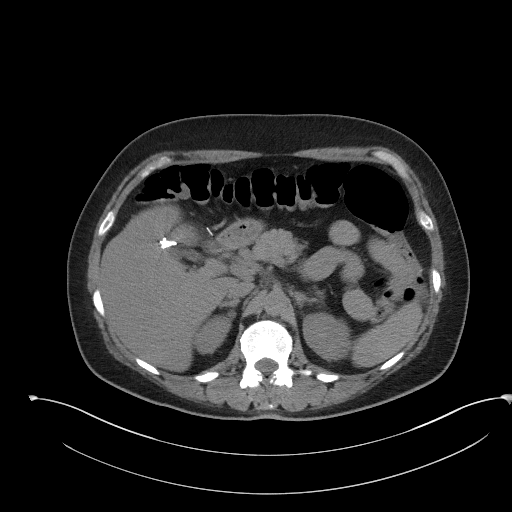
[im 93/106  soft-tissue]
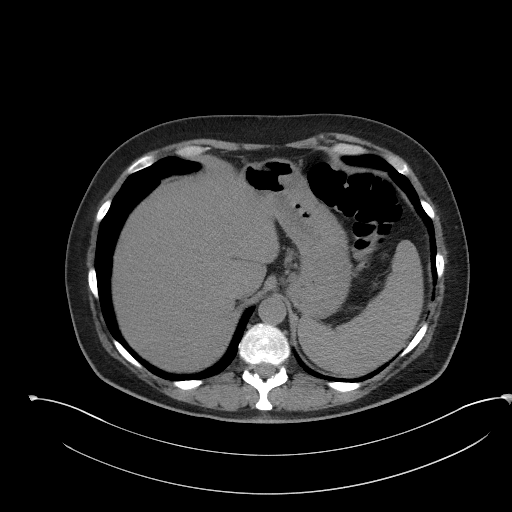
[im 101/106  soft-tissue]
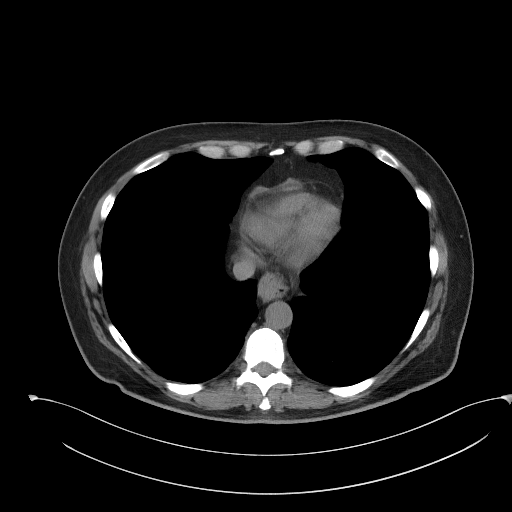

[Series 5: coronal st · coronal · 0.86mm/px · 3 of 93 slices shown]
[im 31/93  soft-tissue]
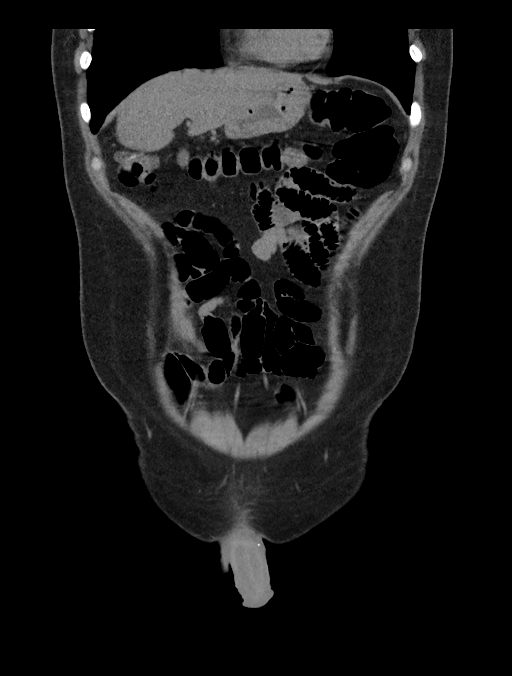
[im 41/93  soft-tissue]
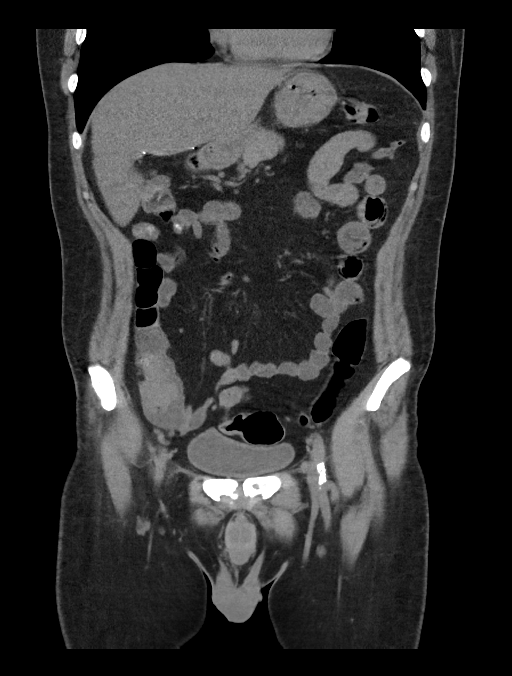
[im 52/93  soft-tissue]
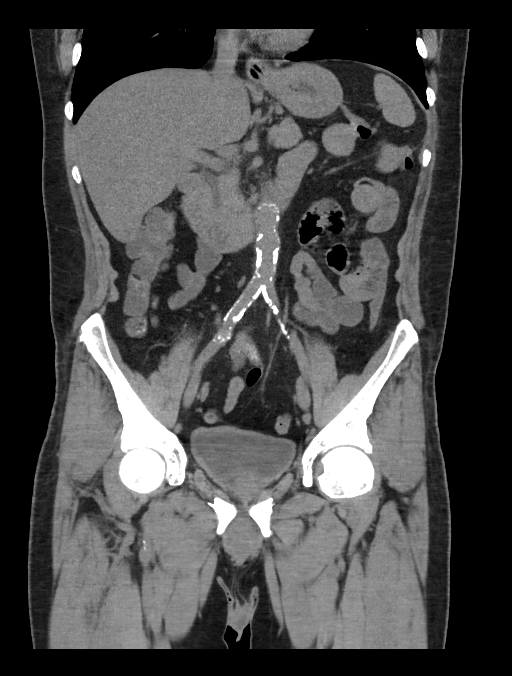

[15 of 46 positions shown; findings below may reference images not displayed]

FINDINGS: Lower chest: There is a small granuloma in the right middle lobe
inferiorly. Lung bases otherwise clear. There is a focal hiatal
hernia.

Hepatobiliary: No focal liver lesions are appreciable on this
noncontrast enhanced study. The gallbladder is absent. There is no
appreciable biliary duct dilatation.

Pancreas: No pancreatic mass or inflammatory focus.

Spleen: No splenic lesions are evident.

Adrenals/Urinary Tract: Adrenals bilaterally appear normal. No
evident renal mass or hydronephrosis on either side. There is no
appreciable renal or ureteral calculus on either side. Urinary
bladder is midline with wall thickness within normal limits.

Stomach/Bowel: There is no appreciable bowel wall or mesenteric
thickening. No evident bowel obstruction. Terminal ileum appears
normal. No appreciable free air or portal venous air.

Vascular/Lymphatic: There is no abdominal aortic aneurysm. There is
extensive aortic and iliac artery atherosclerotic calcification. No
appreciable adenopathy in the abdomen or pelvis.

Reproductive: Prostate and seminal vesicles within normal limits
with respect to size and configuration.

Other: The periappendiceal region appears unremarkable without
inflammation. No abscess or ascites evident in the abdomen or
pelvis. There is slight fat in the umbilicus. There is mild fat in
the right inguinal ring.

Musculoskeletal: No blastic or lytic bone lesions. Foci of
degenerative change in the lumbar spine, most notably at L1-2. No
intramuscular lesions appreciable.
IMPRESSION: 1. No evident renal or ureteral calculus. No hydronephrosis on
either side. Urinary bladder wall thickness within normal limits.

2. No bowel wall thickening or bowel obstruction. No abscess in the
abdomen pelvis. No periappendiceal region inflammation.

3.  Hiatal hernia present.

4.  Aortic Atherosclerosis ([id number]-XLC.C).

5.  Gallbladder absent.

## 2021-12-26 ENCOUNTER — Telehealth: Payer: Self-pay | Admitting: Internal Medicine

## 2021-12-26 ENCOUNTER — Encounter: Payer: Self-pay | Admitting: Internal Medicine

## 2021-12-26 ENCOUNTER — Other Ambulatory Visit (HOSPITAL_BASED_OUTPATIENT_CLINIC_OR_DEPARTMENT_OTHER): Payer: Self-pay

## 2021-12-26 ENCOUNTER — Ambulatory Visit: Payer: Medicare HMO | Admitting: Internal Medicine

## 2021-12-26 ENCOUNTER — Other Ambulatory Visit: Payer: Self-pay

## 2021-12-26 VITALS — BP 120/76 | HR 72 | Ht 70.0 in | Wt 183.0 lb

## 2021-12-26 DIAGNOSIS — J449 Chronic obstructive pulmonary disease, unspecified: Secondary | ICD-10-CM | POA: Diagnosis not present

## 2021-12-26 DIAGNOSIS — E208 Other hypoparathyroidism: Secondary | ICD-10-CM

## 2021-12-26 DIAGNOSIS — E559 Vitamin D deficiency, unspecified: Secondary | ICD-10-CM

## 2021-12-26 DIAGNOSIS — E890 Postprocedural hypothyroidism: Secondary | ICD-10-CM

## 2021-12-26 LAB — BASIC METABOLIC PANEL
BUN: 23 mg/dL (ref 6–23)
CO2: 24 mEq/L (ref 19–32)
Calcium: 9.8 mg/dL (ref 8.4–10.5)
Chloride: 102 mEq/L (ref 96–112)
Creatinine, Ser: 1.37 mg/dL (ref 0.40–1.50)
GFR: 56.31 mL/min — ABNORMAL LOW (ref >60.00)
Glucose, Bld: 95 mg/dL (ref 70–99)
Potassium: 4.3 mEq/L (ref 3.5–5.1)
Sodium: 138 mEq/L (ref 135–145)

## 2021-12-26 LAB — T4, FREE: Free T4: 1.79 ng/dL — ABNORMAL HIGH (ref 0.60–1.60)

## 2021-12-26 LAB — TSH: TSH: 0.12 u[IU]/mL — ABNORMAL LOW (ref 0.35–5.50)

## 2021-12-26 LAB — ALBUMIN: Albumin: 4.5 g/dL (ref 3.5–5.2)

## 2021-12-26 LAB — VITAMIN D 25 HYDROXY (VIT D DEFICIENCY, FRACTURES): VITD: 48.05 ng/mL (ref 30.00–100.00)

## 2021-12-26 MED ORDER — LEVOTHYROXINE SODIUM 150 MCG PO TABS
150.0000 ug | ORAL_TABLET | Freq: Every day | ORAL | 3 refills | Status: DC
  Filled 2021-12-26: qty 90, 90d supply, fill #0

## 2021-12-26 MED ORDER — LEVOTHYROXINE SODIUM 150 MCG PO TABS: 150.0000 ug | ORAL_TABLET | Freq: Every day | ORAL | 3 refills | Status: DC

## 2021-12-26 MED ORDER — CALCITRIOL 0.25 MCG PO CAPS
0.2500 ug | ORAL_CAPSULE | Freq: Every day | ORAL | 3 refills | Status: DC
  Filled 2021-12-26: qty 30, 30d supply, fill #0

## 2021-12-26 MED ORDER — CALCITRIOL 0.25 MCG PO CAPS: 0.2500 ug | ORAL_CAPSULE | Freq: Every day | ORAL | 3 refills | Status: DC

## 2021-12-26 NOTE — Telephone Encounter (Signed)
Can you please call the patient with the following message, as I am not sure if he or his sister are able to go to to the portal ? ? ? ? ?His thyroid is better but continues to be overactive, stop levothyroxine 175 and start levothyroxine 150 mcg daily ? ? ?Calcium is normal but it is at the upper limit of normal, need to reduce the calcitriol from 2 capsules a day to 1 capsule a day ? ? ?Thanks ?

## 2021-12-26 NOTE — Progress Notes (Signed)
? ?Name: Brett Campbell  ?MRN/ DOB: [id number], 1961-02-22    ?Age/ Sex: 61 y.o., male   ? ? ?PCP: [name], NP   ?Reason for Endocrinology Evaluation: Hypocalcemia and Hypothyroidism  ?   ?Initial Endocrinology Clinic Visit: 11/22/2020  ? ? ?PATIENT IDENTIFIER: [name] is a 61 y.o., male with a past medical history of .Deafness due to scarlet fever at 15 months, COPD, and hypocalcemia and Hypothyroidism.  He has followed with [facility] since 11/22/2020 for consultative assistance with management of his Hypothyroidism/Hypocalcemia.  ? ?HISTORICAL SUMMARY:  ?Moved from [place]  ?  ?He is accompanied by his sister [name] and sign language interpreter Clyde Canterbury ?  ?Lives with sister ? ?HYPOCALCEMIA HISTORY  ?He was noted with hypocalcemia  Many years ago. Pt is a poor historian and most of the history was obtained from the sister.  ?Pt is not aware of history of hypocalcemia ?Per sister he has been diagnosed with this many years ago . He is on 2 Tums TID , and calcitriol 2 a day. ?NO D3 prescription ?Has not taken MVI in a couple of weeks ?  ?Denies radiation exposure ?  ?  ?  ?HYPOTHYROID HISTORY: ?  ?S/P total thyroidectomy at age 41 due to goiter . Has been on LT-4 replacement since then. Has history of non compliance requiring hospitalization. Since he has been living with the sister, compliance has improved. He is using a pill box.  ? ?Unknown FH of thyroid disease  ?SUBJECTIVE:  ? ? ?Today (12/26/2021):  [name] is here for Hypocalcemia and Hypothyroidism.  He is accompanied by his Sister [name] and a sign language interpreter ? ?His energy has been good  ?Sister c/o his coughing which at times productive clear ? ?Continues with diarrhea  ?Denies palpitations  ?Denies muscle cramps  ? ? ? ?HOME ENDOCRINE MEDICATIONS ?Calcitriol 0.25 mcg , 2 caps daily  ?calcium 600/ Vitamin D 20 mcg, 2 tabs BID  ?Levothyroxine 175 mcg daily ? ?  ? ? ?HISTORY:  ?Past Medical History:  ?Past  Medical History:  ?Diagnosis Date  ? Calcium deficiency   ? Cataract   ? COPD (chronic obstructive pulmonary disease) ([place])   ? Deaf   ? Hypertension   ? Hypothyroidism   ? Personal history of colonic polyps 11/03/2020  ? Scarlet fever   ? Sleep apnea   ? no longer uses CPAP  ? Thyroid disease   ? Tuberculosis   ? [place]. Health department is contact with him due to hx.  ? ?Past Surgical History:  ?Past Surgical History:  ?Procedure Laterality Date  ? CHOLECYSTECTOMY    ? COLONOSCOPY    ? +5years  ? LAPAROSCOPIC RIGHT HEMI COLECTOMY N/A 02/26/2021  ? Procedure: LAPAROSCOPIC RIGHT HEMI COLECTOMY;  Surgeon: Ileana Roup, MD;  Location: [facility];  Service: General;  Laterality: N/A;  ? THYROIDECTOMY    ? ?Social History:  reports that he has been smoking cigarettes. He has a 11.25 pack-year smoking history. He has never used smokeless tobacco. He reports current alcohol use of about 1.0 - 2.0 standard drink per week. He reports current drug use. Drug: Marijuana. ?Family History:  ?Family History  ?Problem Relation Age of Onset  ? CAD Mother   ? Diabetes Mellitus II Mother   ? Diabetes Mother   ? CAD Father   ? Heart attack Father   ? Heart disease Father   ? Diabetes Sister   ? Colon cancer Neg Hx   ?  Esophageal cancer Neg Hx   ? Stomach cancer Neg Hx   ? Colon polyps Neg Hx   ? ? ? ?HOME MEDICATIONS: ?Allergies as of 12/26/2021   ? ?   Reactions  ? Penicillins   ? unknown  ? ?  ? ?  ?Medication List  ?  ? ?  ? Accurate as of Dec 26, 2021 12:38 PM. If you have any questions, ask your nurse or doctor.  ?  ?  ? ?  ? ?amLODipine 5 MG tablet ?Commonly known as: NORVASC ?Take 1 tablet (5 mg total) by mouth daily. ?  ?atorvastatin 10 MG tablet ?Commonly known as: LIPITOR ?Take 1 tablet (10 mg total) by mouth daily. ?  ?calcitRIOL 0.25 MCG capsule ?Commonly known as: ROCALTROL ?Take 2 capsules (0.5 mcg total) by mouth daily. ?  ?Caltrate 600+D Plus Minerals 600-800 MG-UNIT Chew ?Chew 1,200 mg by mouth 2 (two) times  daily. ?What changed: how much to take ?  ?ibuprofen 200 MG tablet ?Commonly known as: ADVIL ?Take 400-800 mg by mouth every 6 (six) hours as needed for moderate pain. ?  ?levothyroxine 175 MCG tablet ?Commonly known as: SYNTHROID ?Take 1 tablet (175 mcg total) by mouth daily. ?  ?lisinopril 10 MG tablet ?Commonly known as: ZESTRIL ?Take 1 tablet (10 mg total) by mouth daily. ?  ?potassium chloride SA 20 MEQ tablet ?Commonly known as: KLOR-CON M ?Take 1 tablet (20 mEq total) by mouth daily. ?  ? ?  ? ? ? ? ?OBJECTIVE:  ? ?PHYSICAL EXAM: ?VS: BP 120/76 (BP Location: Left Arm, Patient Position: Sitting, Cuff Size: Small)   Pulse 72   Ht 5\' 10"  (1.778 m)   Wt 183 lb (83 kg)   SpO2 98%   BMI 26.26 kg/m?  ? ? ?EXAM: ?General: Pt appears well and is in NAD  ?Neck: General: Supple without adenopathy. ?Thyroid:  No goiter or nodules appreciated.   ?Lungs: Clear with good BS bilat with no rales, rhonchi, or wheezes  ?Heart: Auscultation: RRR.  ?Abdomen: Normoactive bowel sounds, soft, nontender, without masses or organomegaly palpable  ?Extremities:  ?BL LE: No pretibial edema normal ROM and strength.  ? ? ? ?DATA REVIEWED: ? ? ? Latest Reference Range & Units 12/26/21 08:51  ?Sodium 135 - 145 mEq/L 138  ?Potassium 3.5 - 5.1 mEq/L 4.3  ?Chloride 96 - 112 mEq/L 102  ?CO2 19 - 32 mEq/L 24  ?Glucose 70 - 99 mg/dL 95  ?BUN 6 - 23 mg/dL 23  ?Creatinine 0.40 - 1.50 mg/dL 1.37  ?Calcium 8.4 - 10.5 mg/dL 9.8  ?Albumin 3.5 - 5.2 g/dL 4.5  ?GFR >60.00 mL/min 56.31 (L)  ? ? Latest Reference Range & Units 12/26/21 08:51  ?VITD 30.00 - 100.00 ng/mL 48.05  ? ? ? ? Latest Reference Range & Units 12/26/21 08:51  ?TSH 0.35 - 5.50 uIU/mL 0.12 (L)  ?T4,Free(Direct) 0.60 - 1.60 ng/dL 1.79 (H)  ? ? ? ? ?ASSESSMENT / PLAN / RECOMMENDATIONS:  ? ?Hypoparathyroidism ?  ? ?-Per sister patient continues with imperfect adherence to calcium intake ?-The goals of therapy in patients with hypoparathyroidism are to relieve symptoms, to raise and  maintain the serum calcium concentration in the low-normal range( 8.0 to 9 mg/dL), and to prevent iatrogenic development of kidney stones. Attainment of higher serum calcium values is not necessary and is usually limited by the development of hypercalciuria due to the loss of renal calcium-retaining effects of parathyroid hormone . ? ?-His serum calcium is at  the upper limit of normal, will reduce calcitriol as below ? ?  ?Medications : ? ?- Continue calcium 600 mg, 2 tablets with Lunch and 2 tablet with Supper ?-Decrease calcitriol 0.25 mcg t to 1 capsule a day ?  ?  ?  ?2. Post Surgical Hypothyroidism: ?  ?- No local neck symptoms ?-No local neck symptoms ?-TSH continues to be low but improving, will reduce levothyroxine as below ? ?Medications ?Stop levothyroxine 175 mcg daily ?Start levothyroxine 150 mcg daily ? ? ?  ?  ?3. Vitamin D Insufficiency: ?  ? ?- This has normalized in the past, no changes to Vitamin D replacement  ? ?- Its part of his calcium tablets ? ? ?F/U in 6 months  ? ?  ?Signed electronically by: ?Abby Nena Jordan, MD ? ?[facility]  ?[facility] ?[address] ?[place], [place] [address] ?Phone: [phone] ?FAX: [phone]  ? ? ? ? ?CC: ?[name], NP ?[place] ?[place] [address] ?Phone: [phone]  ?Fax: [phone] ? ? ?Return to Endocrinology clinic as below: ?Future Appointments  ?Date Time Provider [place]  ?03/18/2022  2:20 PM [name], NP [facility] None  ?06/30/2022  8:30 AM Venecia Mehl, Melanie Crazier, MD [facility] None  ? ?  ? ?

## 2021-12-26 NOTE — Telephone Encounter (Signed)
Patient sister [name] notified and verbalized understanding  ?

## 2021-12-26 NOTE — Patient Instructions (Addendum)
?-   Continue Calcium  600 mg/Vitamin D , 2 tablets with Lunch and 2 tablet with Supper ?- Continue Calcitriol 0.25 mcg two capsules a day  ?- Levothyroxine 175 mg , 1 tablet daily   ? ? ? ? ?

## 2022-02-13 IMAGING — CT CT ABD-PELV W/ CM
2 of 5 series · 15 of 46 positions shown, 17 images · IV contrast (omnipaque)
Comparison: 08/29/2020

CLINICAL DATA: Cecal polyp, lower abdominal pain, rectal bleeding

EXAM:
CT ABDOMEN AND PELVIS WITH CONTRAST
TECHNIQUE: Multidetector CT imaging of the abdomen and pelvis was performed
using the standard protocol following bolus administration of
intravenous contrast.
CONTRAST:  100mL OMNIPAQUE IOHEXOL 300 MG/ML  SOLN

[Series 2: axial st · axial · 0.77mm/px · z∈[+995,+1415]mm · 12 of 98 slices shown, 14 images]
[im 7/98  soft-tissue]
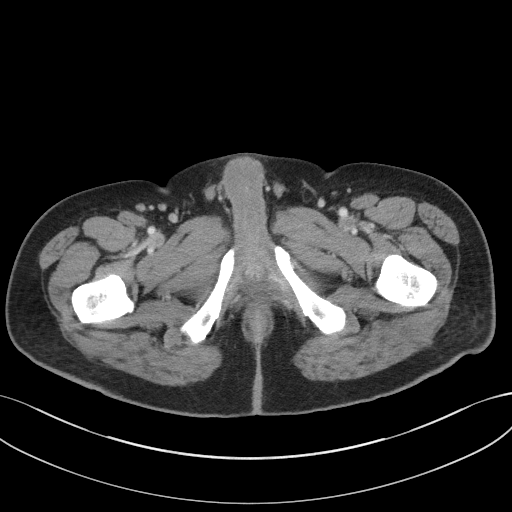
[im 7/98  bone]
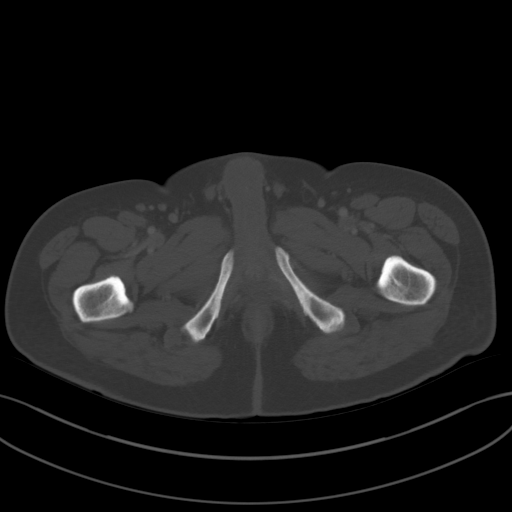
[im 13/98  soft-tissue]
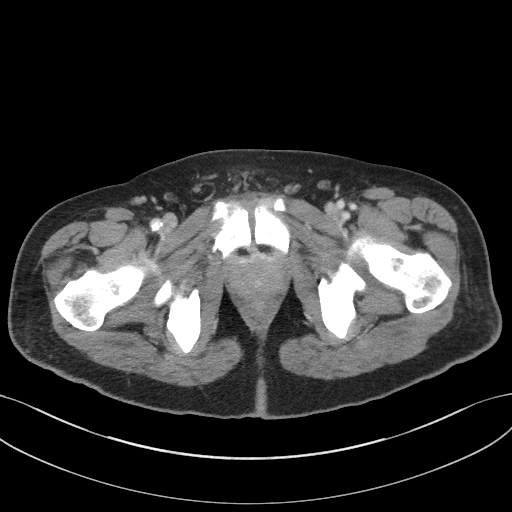
[im 25/98  soft-tissue]
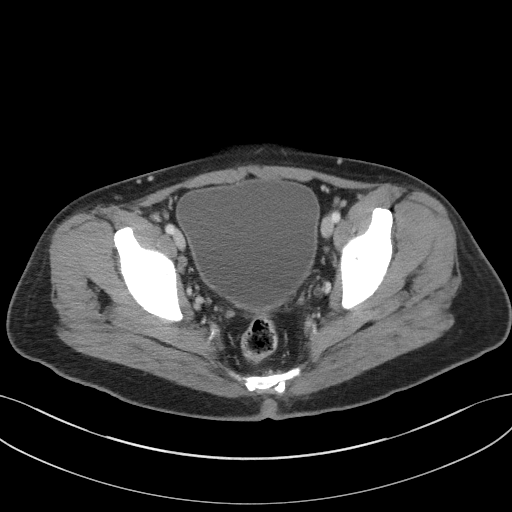
[im 31/98  soft-tissue]
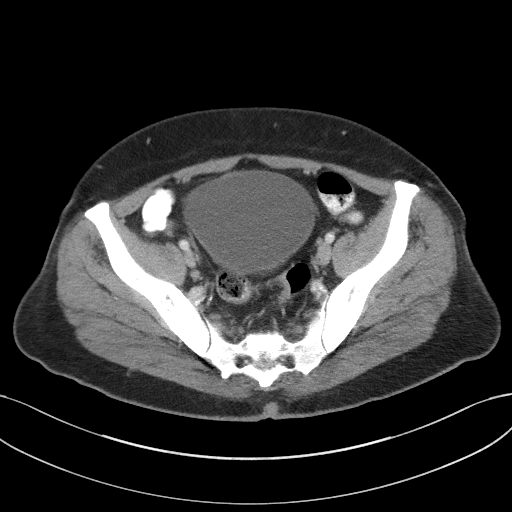
[im 37/98  soft-tissue]
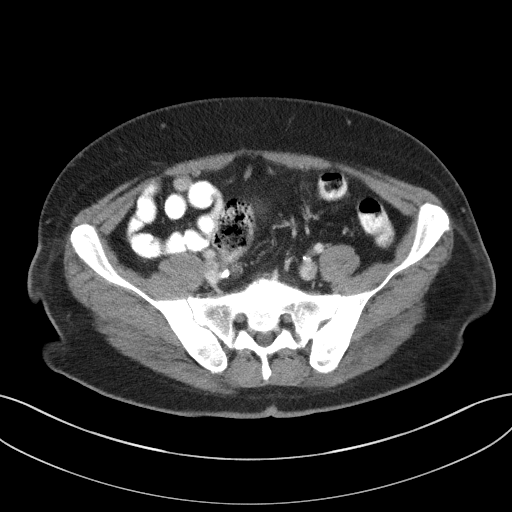
[im 43/98  soft-tissue]
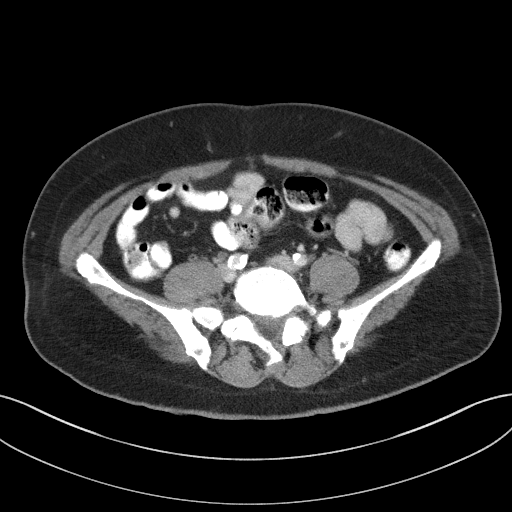
[im 55/98  soft-tissue]
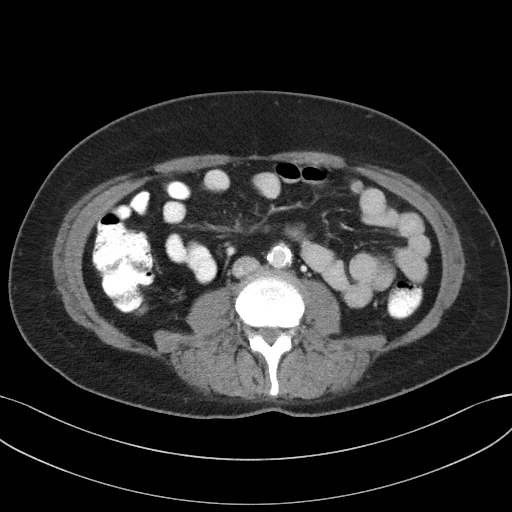
[im 61/98  soft-tissue]
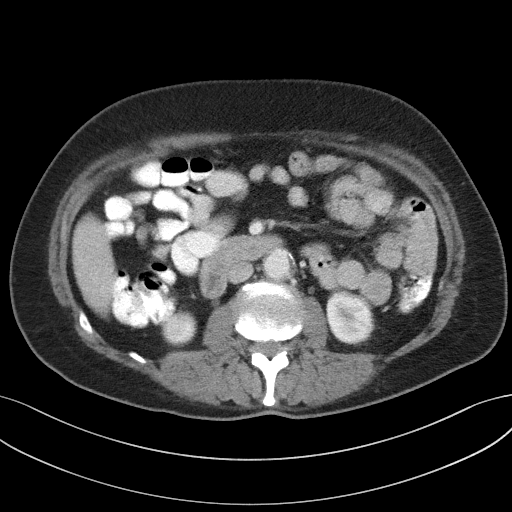
[im 67/98  soft-tissue]
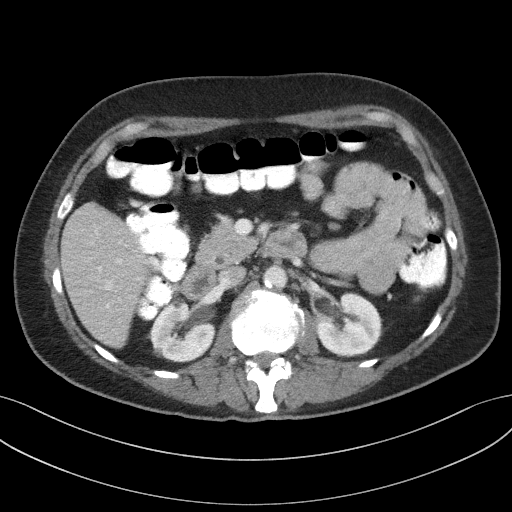
[im 67/98  bone]
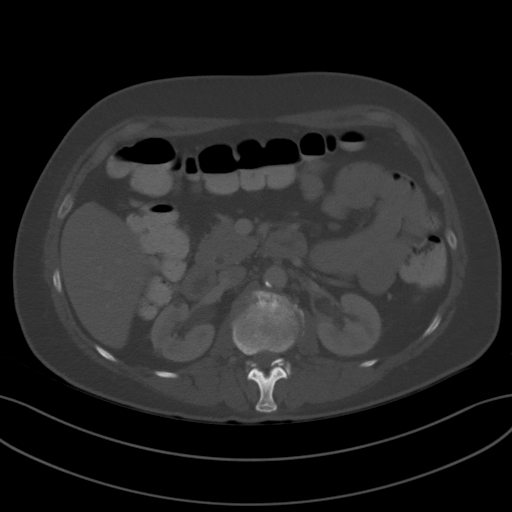
[im 73/98  soft-tissue]
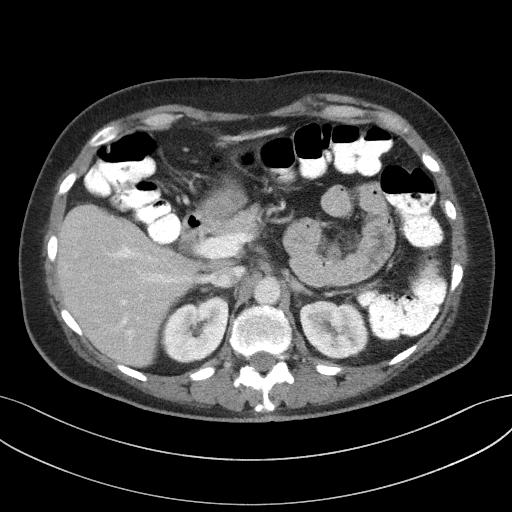
[im 85/98  soft-tissue]
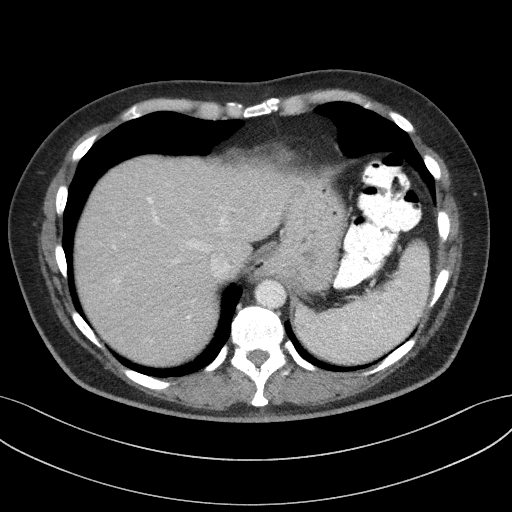
[im 91/98  soft-tissue]
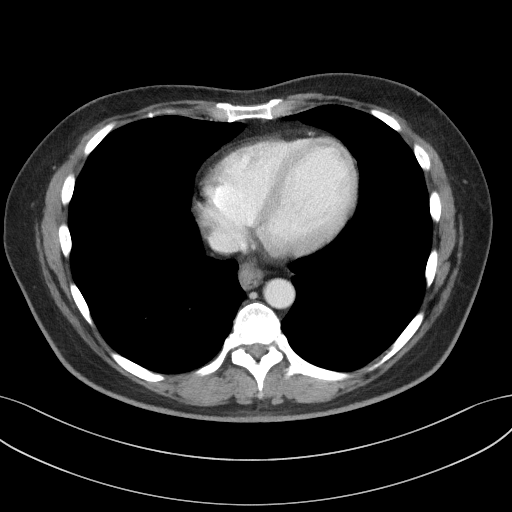

[Series 5: coronal st · coronal · 0.75mm/px · 3 of 93 slices shown]
[im 31/93  soft-tissue]
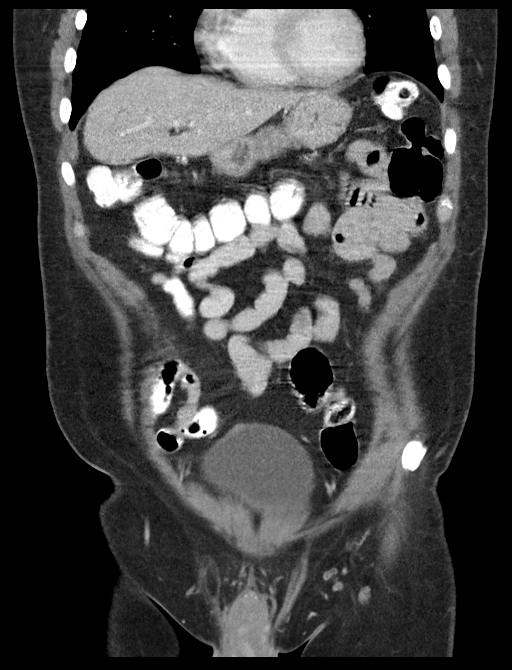
[im 41/93  soft-tissue]
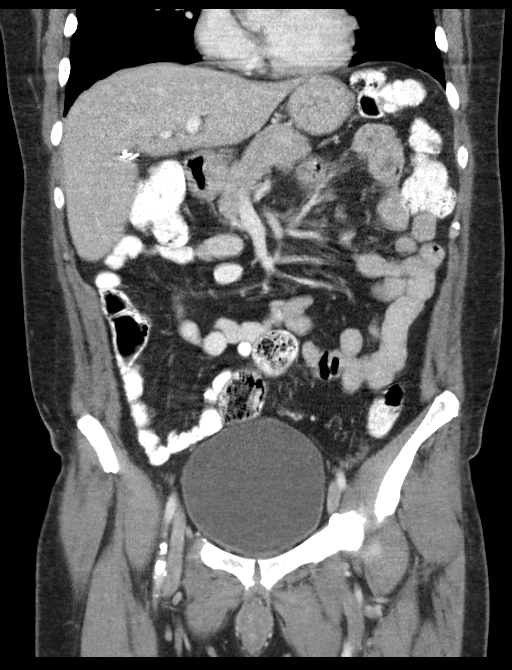
[im 52/93  soft-tissue]
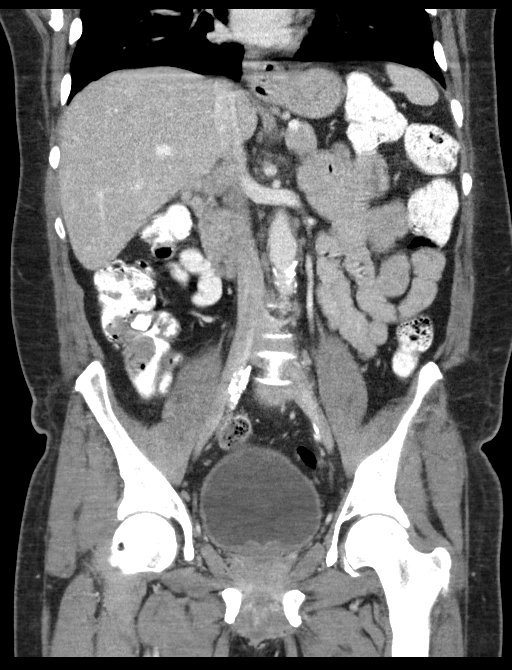

[15 of 46 positions shown; findings below may reference images not displayed]

FINDINGS: Lower chest: The visualized lung bases are clear bilaterally. The
visualized heart and pericardium are unremarkable. Small hiatal
hernia.

Hepatobiliary: No focal liver abnormality is seen. Status post
cholecystectomy. No biliary dilatation.

Pancreas: Unremarkable

Spleen: Unremarkable

Adrenals/Urinary Tract: The adrenal glands are unremarkable. The
kidneys are normal in size and position. Multiple simple cortical
cysts are seen within the right kidney. The kidneys are otherwise
unremarkable. The bladder is mildly distended, but is otherwise
unremarkable.

Stomach/Bowel: A polypoid mass is seen within the cecum immediately
adjacent to the ileocecal junction measuring 3.5 x 4.2 x 4.9 cm in
greatest dimension. The stomach, small bowel, and large bowel are
otherwise unremarkable. No obstruction or focal inflammation. No
free intraperitoneal gas or fluid. Appendix normal.

Vascular/Lymphatic: Infrarenal fusiform abdominal aortic aneurysm is
present measuring 2.7 cm in greatest dimension (diameter of a more
normal segment of aorta proximally measures 18 mm in diameter and
this represents a 50% increase in maximal diameter). This terminates
at the level of the inferior mesenteric artery origin. Superimposed
extensive aortoiliac atherosclerotic calcification. No pathologic
adenopathy within the abdomen and pelvis.

Reproductive: Prostate is unremarkable.

Other: Tiny fat containing right inguinal hernia. Rectum
unremarkable.

Musculoskeletal: No lytic or blastic bone lesion identified. No
acute bone abnormality.
IMPRESSION: 4.9 cm polypoid mass within the cecum adjacent to the ileocecal
junction. No evidence of obstruction or transmural extension.

2.9 cm infrarenal abdominal aortic aneurysm. Recommend follow-up
ultrasound every 5 years. This recommendation follows ACR consensus
guidelines: White Paper of the ACR Incidental Findings Committee II

Aortic aneurysm NOS (M927M-O4C.X).

## 2022-03-11 NOTE — Progress Notes (Deleted)
Established patient visit   Patient: Brett Campbell   DOB: September 09, 1960   61 y.o. Male  MRN: [id number] Visit Date: 03/12/2022   No chief complaint on file.  Subjective    HPI Follow up blood pressure -historically well managed on current medication  -due for 2nd shingles vaccine     Medications: Outpatient Medications Prior to Visit  Medication Sig   amLODipine (NORVASC) 5 MG tablet Take 1 tablet (5 mg total) by mouth daily.   atorvastatin (LIPITOR) 10 MG tablet Take 1 tablet (10 mg total) by mouth daily.   calcitRIOL (ROCALTROL) 0.25 MCG capsule Take 1 capsule (0.25 mcg total) by mouth daily.   Calcium Carbonate-Vit D-Min (CALTRATE 600+D PLUS MINERALS) 600-800 MG-UNIT CHEW Chew 1,200 mg by mouth 2 (two) times daily. (Patient taking differently: Chew 2 tablets by mouth 2 (two) times daily.)   ibuprofen (ADVIL) 200 MG tablet Take 400-800 mg by mouth every 6 (six) hours as needed for moderate pain.   levothyroxine (SYNTHROID) 150 MCG tablet Take 1 tablet (150 mcg total) by mouth daily.   lisinopril (ZESTRIL) 10 MG tablet Take 1 tablet (10 mg total) by mouth daily.   potassium chloride SA (KLOR-CON M) 20 MEQ tablet Take 1 tablet (20 mEq total) by mouth daily.   No facility-administered medications prior to visit.    Review of Systems  Last CBC Lab Results  Component Value Date   WBC 13.7 (H) 09/02/2021   HGB 15.6 09/02/2021   HCT 45.7 09/02/2021   MCV 86 09/02/2021   MCH 29.4 09/02/2021   RDW 11.7 09/02/2021   PLT 255 73/22/0254   Last metabolic panel Lab Results  Component Value Date   GLUCOSE 95 12/26/2021   NA 138 12/26/2021   K 4.3 12/26/2021   CL 102 12/26/2021   CO2 24 12/26/2021   BUN 23 12/26/2021   CREATININE 1.37 12/26/2021   EGFR 71 09/02/2021   CALCIUM 9.8 12/26/2021   PROT 7.1 09/02/2021   ALBUMIN 4.5 12/26/2021   LABGLOB 2.4 09/02/2021   AGRATIO 2.0 09/02/2021   BILITOT 0.4 09/02/2021   ALKPHOS 100 09/02/2021   AST 28 09/02/2021   ALT 42  09/02/2021   ANIONGAP 12 03/01/2021   Last lipids Lab Results  Component Value Date   CHOL 141 09/02/2021   HDL 33 (L) 09/02/2021   LDLCALC 72 09/02/2021   TRIG 217 (H) 09/02/2021   CHOLHDL 4.3 09/02/2021   Last hemoglobin A1c Lab Results  Component Value Date   HGBA1C 6.2 (H) 09/02/2021   Last thyroid functions Lab Results  Component Value Date   TSH 0.12 (L) 12/26/2021   Last vitamin D Lab Results  Component Value Date   VD25OH 48.05 12/26/2021       Objective    There were no vitals taken for this visit. BP Readings from Last 3 Encounters:  12/26/21 120/76  09/23/21 103/62  07/29/21 120/81    Wt Readings from Last 3 Encounters:  12/26/21 183 lb (83 kg)  09/23/21 186 lb 9.6 oz (84.6 kg)  07/29/21 191 lb 14.4 oz (87 kg)    Physical Exam  ***  No results found for any visits on 03/12/22.  Assessment & Plan     Problem List Items Addressed This Visit   None    No follow-ups on file.         [name], NP  [facility] [phone] (phone) [phone] (fax)  [place]

## 2022-03-12 ENCOUNTER — Ambulatory Visit (INDEPENDENT_AMBULATORY_CARE_PROVIDER_SITE_OTHER): Payer: Medicare HMO | Admitting: Nurse Practitioner

## 2022-03-18 ENCOUNTER — Ambulatory Visit: Payer: Medicare HMO | Admitting: Nurse Practitioner

## 2022-03-25 ENCOUNTER — Ambulatory Visit (INDEPENDENT_AMBULATORY_CARE_PROVIDER_SITE_OTHER)
Admission: EM | Admit: 2022-03-25 | Discharge: 2022-03-25 | Disposition: A | Discharge status: Home / Self Care | Payer: Medicare HMO | Source: Home / Self Care

## 2022-03-25 ENCOUNTER — Encounter (HOSPITAL_COMMUNITY): Payer: Self-pay

## 2022-03-25 ENCOUNTER — Ambulatory Visit (INDEPENDENT_AMBULATORY_CARE_PROVIDER_SITE_OTHER): Payer: Medicare HMO

## 2022-03-25 DIAGNOSIS — R509 Fever, unspecified: Secondary | ICD-10-CM | POA: Diagnosis not present

## 2022-03-25 DIAGNOSIS — F17210 Nicotine dependence, cigarettes, uncomplicated: Secondary | ICD-10-CM | POA: Diagnosis present

## 2022-03-25 DIAGNOSIS — I10 Essential (primary) hypertension: Secondary | ICD-10-CM | POA: Diagnosis not present

## 2022-03-25 DIAGNOSIS — Z20822 Contact with and (suspected) exposure to covid-19: Secondary | ICD-10-CM | POA: Diagnosis not present

## 2022-03-25 DIAGNOSIS — E782 Mixed hyperlipidemia: Secondary | ICD-10-CM | POA: Diagnosis not present

## 2022-03-25 DIAGNOSIS — R059 Cough, unspecified: Secondary | ICD-10-CM | POA: Diagnosis not present

## 2022-03-25 DIAGNOSIS — H9190 Unspecified hearing loss, unspecified ear: Secondary | ICD-10-CM | POA: Diagnosis present

## 2022-03-25 DIAGNOSIS — Z8249 Family history of ischemic heart disease and other diseases of the circulatory system: Secondary | ICD-10-CM | POA: Diagnosis not present

## 2022-03-25 DIAGNOSIS — G4730 Sleep apnea, unspecified: Secondary | ICD-10-CM | POA: Diagnosis present

## 2022-03-25 DIAGNOSIS — Z86010 Personal history of colonic polyps: Secondary | ICD-10-CM | POA: Diagnosis not present

## 2022-03-25 DIAGNOSIS — Z8619 Personal history of other infectious and parasitic diseases: Secondary | ICD-10-CM | POA: Diagnosis not present

## 2022-03-25 DIAGNOSIS — E890 Postprocedural hypothyroidism: Secondary | ICD-10-CM | POA: Diagnosis not present

## 2022-03-25 DIAGNOSIS — Z9049 Acquired absence of other specified parts of digestive tract: Secondary | ICD-10-CM | POA: Diagnosis not present

## 2022-03-25 DIAGNOSIS — E209 Hypoparathyroidism, unspecified: Secondary | ICD-10-CM | POA: Diagnosis not present

## 2022-03-25 DIAGNOSIS — Z79890 Hormone replacement therapy (postmenopausal): Secondary | ICD-10-CM | POA: Diagnosis not present

## 2022-03-25 DIAGNOSIS — J189 Pneumonia, unspecified organism: Secondary | ICD-10-CM | POA: Insufficient documentation

## 2022-03-25 DIAGNOSIS — R0902 Hypoxemia: Secondary | ICD-10-CM | POA: Diagnosis present

## 2022-03-25 DIAGNOSIS — R0602 Shortness of breath: Secondary | ICD-10-CM | POA: Diagnosis not present

## 2022-03-25 DIAGNOSIS — A419 Sepsis, unspecified organism: Secondary | ICD-10-CM | POA: Diagnosis not present

## 2022-03-25 DIAGNOSIS — H269 Unspecified cataract: Secondary | ICD-10-CM | POA: Diagnosis present

## 2022-03-25 DIAGNOSIS — Z880 Allergy status to penicillin: Secondary | ICD-10-CM | POA: Diagnosis not present

## 2022-03-25 DIAGNOSIS — Z79899 Other long term (current) drug therapy: Secondary | ICD-10-CM | POA: Diagnosis not present

## 2022-03-25 DIAGNOSIS — J440 Chronic obstructive pulmonary disease with acute lower respiratory infection: Secondary | ICD-10-CM | POA: Diagnosis not present

## 2022-03-25 LAB — CBC WITH DIFFERENTIAL/PLATELET
Abs Immature Granulocytes: 0.17 10*3/uL — ABNORMAL HIGH (ref 0.00–0.07)
Basophils Absolute: 0.1 10*3/uL (ref 0.0–0.1)
Basophils Relative: 1 %
Eosinophils Absolute: 0.1 10*3/uL (ref 0.0–0.5)
Eosinophils Relative: 1 %
HCT: 37.2 % — ABNORMAL LOW (ref 39.0–52.0)
Hemoglobin: 13.2 g/dL (ref 13.0–17.0)
Immature Granulocytes: 1 %
Lymphocytes Relative: 10 %
Lymphs Abs: 1.7 10*3/uL (ref 0.7–4.0)
MCH: 31.1 pg (ref 26.0–34.0)
MCHC: 35.5 g/dL (ref 30.0–36.0)
MCV: 87.5 fL (ref 80.0–100.0)
Monocytes Absolute: 1.6 10*3/uL — ABNORMAL HIGH (ref 0.1–1.0)
Monocytes Relative: 9 %
Neutro Abs: 13.9 10*3/uL — ABNORMAL HIGH (ref 1.7–7.7)
Neutrophils Relative %: 78 %
Platelets: 292 10*3/uL (ref 150–400)
RBC: 4.25 MIL/uL (ref 4.22–5.81)
RDW: 11.8 % (ref 11.5–15.5)
WBC: 17.5 10*3/uL — ABNORMAL HIGH (ref 4.0–10.5)
nRBC: 0 % (ref 0.0–0.2)

## 2022-03-25 LAB — COMPREHENSIVE METABOLIC PANEL
ALT: 24 U/L (ref 0–44)
AST: 24 U/L (ref 15–41)
Albumin: 2.8 g/dL — ABNORMAL LOW (ref 3.5–5.0)
Alkaline Phosphatase: 71 U/L (ref 38–126)
Anion gap: 10 (ref 5–15)
BUN: 13 mg/dL (ref 6–20)
CO2: 24 mmol/L (ref 22–32)
Calcium: 7.5 mg/dL — ABNORMAL LOW (ref 8.9–10.3)
Chloride: 103 mmol/L (ref 98–111)
Creatinine, Ser: 1.29 mg/dL — ABNORMAL HIGH (ref 0.61–1.24)
GFR, Estimated: >60 mL/min (ref >60)
Glucose, Bld: 96 mg/dL (ref 70–99)
Potassium: 3.8 mmol/L (ref 3.5–5.1)
Sodium: 137 mmol/L (ref 135–145)
Total Bilirubin: 0.7 mg/dL (ref 0.3–1.2)
Total Protein: 7.1 g/dL (ref 6.5–8.1)

## 2022-03-25 MED ORDER — LEVOFLOXACIN 750 MG PO TABS: 750.0000 mg | ORAL_TABLET | Freq: Every day | ORAL | 0 refills | Status: DC

## 2022-03-25 NOTE — ED Provider Notes (Signed)
[place]    CSN: [id number] Arrival date & time: 03/25/22  0930      History   Chief Complaint Chief Complaint  Patient presents with   Cough    HPI Brett Campbell is a 61 y.o. male.   Patient presents today companied by his significant other who provides the majority of history.  Reports a week plus long history of productive cough that is worsening prompting evaluation.  Does report history of subjective fever as well as some shortness of breath.  He has also experienced some decreased appetite as well as intermittent diarrhea.  He has not been trying any over-the-counter medication for symptom management.  He is a current every day smoker.  Does have a history of COPD but is not using any albuterol or other medications for management of symptoms.  Denies any recent antibiotic or steroid use.    Past Medical History:  Diagnosis Date   Calcium deficiency    Cataract    COPD (chronic obstructive pulmonary disease) ([place])    Deaf    Hypertension    Hypothyroidism    Personal history of colonic polyps 11/03/2020   Scarlet fever    Sleep apnea    no longer uses CPAP   Thyroid disease    Tuberculosis    [place]. Health department is contact with him due to hx.    Patient Active Problem List   Diagnosis Date Noted   Hypokalemia 09/28/2021   Hypocalcemia 09/28/2021   Need for shingles vaccine 09/28/2021   Tobacco use disorder, continuous 09/28/2021   Essential hypertension 08/03/2021   Mixed hyperlipidemia 08/03/2021   Calcium deficiency 08/03/2021   Body mass index 26.0-26.9, adult 08/03/2021   S/P right hemicolectomy 02/26/2021   Genetic testing 02/14/2021   Other hypoparathyroidism ([place]) 11/22/2020   Acquired hypothyroidism 11/22/2020   Personal history of colonic polyps 11/03/2020    Past Surgical History:  Procedure Laterality Date   CHOLECYSTECTOMY     COLONOSCOPY     +5years   LAPAROSCOPIC RIGHT HEMI COLECTOMY N/A 02/26/2021   Procedure:  LAPAROSCOPIC RIGHT HEMI COLECTOMY;  Surgeon: Ileana Roup, MD;  Location: [facility];  Service: General;  Laterality: N/A;   THYROIDECTOMY         Home Medications    Prior to Admission medications   Medication Sig Start Date End Date Taking? Authorizing Provider  levofloxacin (LEVAQUIN) 750 MG tablet Take 1 tablet (750 mg total) by mouth daily. 03/25/22  Yes Natanya Holecek K, PA-C  amLODipine (NORVASC) 5 MG tablet Take 1 tablet (5 mg total) by mouth daily. 09/23/21   [name], NP  atorvastatin (LIPITOR) 10 MG tablet Take 1 tablet (10 mg total) by mouth daily. 09/23/21   [name], NP  calcitRIOL (ROCALTROL) 0.25 MCG capsule Take 1 capsule (0.25 mcg total) by mouth daily. 12/26/21   Shamleffer, Melanie Crazier, MD  Calcium Carbonate-Vit D-Min (CALTRATE 600+D PLUS MINERALS) 600-800 MG-UNIT CHEW Chew 1,200 mg by mouth 2 (two) times daily. Patient taking differently: Chew 2 tablets by mouth 2 (two) times daily. 11/22/20   Shamleffer, Melanie Crazier, MD  ibuprofen (ADVIL) 200 MG tablet Take 400-800 mg by mouth every 6 (six) hours as needed for moderate pain.    [provider]  levothyroxine (SYNTHROID) 150 MCG tablet Take 1 tablet (150 mcg total) by mouth daily. 12/26/21   Shamleffer, Melanie Crazier, MD  lisinopril (ZESTRIL) 10 MG tablet Take 1 tablet (10 mg total) by mouth daily. 09/23/21  [name], NP  potassium chloride SA (KLOR-CON M) 20 MEQ tablet Take 1 tablet (20 mEq total) by mouth daily. 09/23/21   [name], NP    Family History Family History  Problem Relation Age of Onset   CAD Mother    Diabetes Mellitus II Mother    Diabetes Mother    CAD Father    Heart attack Father    Heart disease Father    Diabetes Sister    Colon cancer Neg Hx    Esophageal cancer Neg Hx    Stomach cancer Neg Hx    Colon polyps Neg Hx     Social History Social History   Tobacco Use   Smoking status: Some Days    Packs/day: 0.25    Years: 45.00    Total  pack years: 11.25    Types: Cigarettes   Smokeless tobacco: Never   Tobacco comments:    pt states he smokes 1 cigarette per day  Vaping Use   Vaping Use: Never used  Substance Use Topics   Alcohol use: Yes    Alcohol/week: 1.0 - 2.0 standard drink of alcohol    Types: 1 - 2 Cans of beer per week    Comment: occas   Drug use: Yes    Types: Marijuana     Allergies   Penicillins   Review of Systems Review of Systems  Constitutional:  Positive for activity change, fatigue and fever. Negative for appetite change.  HENT:  Positive for congestion. Negative for sinus pressure, sneezing and sore throat.   Respiratory:  Positive for cough and shortness of breath.   Cardiovascular:  Negative for chest pain.  Gastrointestinal:  Positive for diarrhea. Negative for abdominal pain, nausea and vomiting.  Neurological:  Negative for dizziness, light-headedness and headaches.     Physical Exam Triage Vital Signs ED Triage Vitals  Enc Vitals Group     BP 03/25/22 1103 (!) 109/57     Pulse Rate 03/25/22 1103 79     Resp 03/25/22 1103 18     Temp 03/25/22 1103 98 F (36.7 C)     Temp src --      SpO2 03/25/22 1103 96 %     Weight --      Height --      Head Circumference --      Peak Flow --      Pain Score 03/25/22 1104 0     Pain Loc --      Pain Edu? --      Excl. in [place]? --    No data found.  Updated Vital Signs BP (!) 109/57 (BP Location: Left Arm)   Pulse 79   Temp 98 F (36.7 C)   Resp 18   SpO2 96%   Visual Acuity Right Eye Distance:   Left Eye Distance:   Bilateral Distance:    Right Eye Near:   Left Eye Near:    Bilateral Near:     Physical Exam Vitals reviewed.  Constitutional:      General: He is awake.     Appearance: Normal appearance. He is well-developed. He is not ill-appearing.     Comments: Very pleasant male appears stated age in no acute distress sitting comfortably in exam room  HENT:     Head: Normocephalic and atraumatic.     Right  Ear: Tympanic membrane, ear canal and external ear normal. Tympanic membrane is not erythematous or bulging.  Left Ear: Tympanic membrane, ear canal and external ear normal. Tympanic membrane is not erythematous or bulging.     Nose: Nose normal.     Mouth/Throat:     Dentition: Abnormal dentition.     Pharynx: Uvula midline. No oropharyngeal exudate, posterior oropharyngeal erythema or uvula swelling.  Cardiovascular:     Rate and Rhythm: Normal rate and regular rhythm.     Heart sounds: Normal heart sounds, S1 normal and S2 normal. No murmur heard. Pulmonary:     Effort: Pulmonary effort is normal. No accessory muscle usage or respiratory distress.     Breath sounds: No stridor. Rhonchi present. No wheezing or rales.     Comments: Scattered rhonchi Neurological:     Mental Status: He is alert.  Psychiatric:        Behavior: Behavior is cooperative.      UC Treatments / Results  Labs (all labs ordered are listed, but only abnormal results are displayed) Labs Reviewed  CBC WITH DIFFERENTIAL/PLATELET  COMPREHENSIVE METABOLIC PANEL    EKG   Radiology DG Chest 2 View  Result Date: 03/25/2022 CLINICAL DATA:  Cough and fever. EXAM: CHEST - 2 VIEW COMPARISON:  05/03/2020 FINDINGS: The cardiac silhouette, mediastinal and hilar contours are within normal limits. Dense left lower lobe airspace process consistent with pneumonia. The right lung is clear. No pleural effusions. Surgical changes likely from prior thyroidectomy. The bony structures are intact. IMPRESSION: Left lower lobe pneumonia. Followup PA and lateral chest X-ray is recommended in 3-4 weeks following trial of antibiotic therapy to ensure resolution and exclude underlying malignancy. Electronically Signed   By: Marijo Sanes M.D.   On: 03/25/2022 11:52    Procedures Procedures (including critical care time)  Medications Ordered in UC Medications - No data to display  Initial Impression / Assessment and Plan / UC  Course  I have reviewed the triage vital signs and the nursing notes.  Pertinent labs & imaging results that were available during my care of the patient were reviewed by me and considered in my medical decision making (see chart for details).     Chest x-ray consistent with CAP.  Based on curb 65 scoring patient is a candidate for outpatient treatment.  CBC and CMP obtained today-results pending.  If he has any significant leukocytosis or acute kidney injury we will need to consider hospitalization.  He was encouraged to rest and drink plenty of fluids.  Given penicillin allergy he was started on levofloxacin 750 mg daily for 10 days.  No indication for but dose adjustment based on CMP from 12/26/2021 with creatinine of 1.37 and calculated creatinine clearance of 67.32 mL/min.  Discussed the importance of close follow-up with PCP.  Patient will need repeat chest x-ray in 3 to 4 weeks and this was discussed during his visit.  If he has any worsening symptoms including high fever, chest pain, shortness of breath, weakness, confusion, nausea/vomiting he needs to go to the emergency room to which he and significant other expressed understanding.  Strict return precautions given.  Final Clinical Impressions(s) / UC Diagnoses   Final diagnoses:  Community acquired pneumonia of left lower lobe of lung     Discharge Instructions      Your x-ray shows that you have pneumonia.  I will contact you with your lab work if anything is abnormal we will need to change her treatment plan.  Please start levofloxacin daily.  If you develop any side effects of this medication including body aches  or joint pain you need to be seen immediately.  It is very important that you follow-up with your primary care soon as possible.  You will need to have a repeat chest x-ray within 3 to 4 weeks.  If anything worsens and you develop chest pain, shortness of breath, fever, nausea/vomiting, weakness you need to go to the emergency  room immediately.     ED Prescriptions     Medication Sig Dispense Auth. Provider   levofloxacin (LEVAQUIN) 750 MG tablet Take 1 tablet (750 mg total) by mouth daily. 10 tablet Ryelle Ruvalcaba, Derry Skill, PA-C      PDMP not reviewed this encounter.   Terrilee Croak, PA-C 03/25/22 1212

## 2022-03-25 NOTE — Discharge Instructions (Signed)
Your x-ray shows that you have pneumonia.  I will contact you with your lab work if anything is abnormal we will need to change her treatment plan.  Please start levofloxacin daily.  If you develop any side effects of this medication including body aches or joint pain you need to be seen immediately.  It is very important that you follow-up with your primary care soon as possible.  You will need to have a repeat chest x-ray within 3 to 4 weeks.  If anything worsens and you develop chest pain, shortness of breath, fever, nausea/vomiting, weakness you need to go to the emergency room immediately.

## 2022-03-25 NOTE — ED Triage Notes (Signed)
Pt c/o productive cough for over a week and fever off and on. States has decreased appetite with diarrhea for week and a half. Using OTC with little relief.

## 2022-03-28 ENCOUNTER — Other Ambulatory Visit: Payer: Self-pay

## 2022-03-28 ENCOUNTER — Encounter (HOSPITAL_COMMUNITY): Payer: Self-pay | Admitting: *Deleted

## 2022-03-28 ENCOUNTER — Inpatient Hospital Stay (HOSPITAL_COMMUNITY)
Admission: EM | Admit: 2022-03-28 | Discharge: 2022-03-30 | DRG: 871 | Disposition: A | Discharge status: Home / Self Care | Payer: Medicare HMO | Attending: Internal Medicine | Admitting: Internal Medicine

## 2022-03-28 ENCOUNTER — Ambulatory Visit (HOSPITAL_COMMUNITY): Admission: EM | Admit: 2022-03-28 | Discharge: 2022-03-28 | Payer: Medicare HMO

## 2022-03-28 ENCOUNTER — Emergency Department (HOSPITAL_COMMUNITY): Payer: Medicare HMO

## 2022-03-28 DIAGNOSIS — E782 Mixed hyperlipidemia: Secondary | ICD-10-CM | POA: Diagnosis present

## 2022-03-28 DIAGNOSIS — Z880 Allergy status to penicillin: Secondary | ICD-10-CM | POA: Diagnosis not present

## 2022-03-28 DIAGNOSIS — J189 Pneumonia, unspecified organism: Secondary | ICD-10-CM | POA: Diagnosis present

## 2022-03-28 DIAGNOSIS — E209 Hypoparathyroidism, unspecified: Secondary | ICD-10-CM | POA: Diagnosis present

## 2022-03-28 DIAGNOSIS — Z9049 Acquired absence of other specified parts of digestive tract: Secondary | ICD-10-CM

## 2022-03-28 DIAGNOSIS — Z8619 Personal history of other infectious and parasitic diseases: Secondary | ICD-10-CM

## 2022-03-28 DIAGNOSIS — Z79899 Other long term (current) drug therapy: Secondary | ICD-10-CM

## 2022-03-28 DIAGNOSIS — Z8249 Family history of ischemic heart disease and other diseases of the circulatory system: Secondary | ICD-10-CM

## 2022-03-28 DIAGNOSIS — R0602 Shortness of breath: Secondary | ICD-10-CM | POA: Diagnosis present

## 2022-03-28 DIAGNOSIS — Z86010 Personal history of colonic polyps: Secondary | ICD-10-CM

## 2022-03-28 DIAGNOSIS — J440 Chronic obstructive pulmonary disease with acute lower respiratory infection: Secondary | ICD-10-CM | POA: Diagnosis present

## 2022-03-28 DIAGNOSIS — H269 Unspecified cataract: Secondary | ICD-10-CM | POA: Diagnosis present

## 2022-03-28 DIAGNOSIS — Z79890 Hormone replacement therapy (postmenopausal): Secondary | ICD-10-CM

## 2022-03-28 DIAGNOSIS — I10 Essential (primary) hypertension: Secondary | ICD-10-CM | POA: Diagnosis present

## 2022-03-28 DIAGNOSIS — Z20822 Contact with and (suspected) exposure to covid-19: Secondary | ICD-10-CM | POA: Diagnosis present

## 2022-03-28 DIAGNOSIS — A419 Sepsis, unspecified organism: Secondary | ICD-10-CM | POA: Diagnosis present

## 2022-03-28 DIAGNOSIS — F17210 Nicotine dependence, cigarettes, uncomplicated: Secondary | ICD-10-CM | POA: Diagnosis present

## 2022-03-28 DIAGNOSIS — E890 Postprocedural hypothyroidism: Secondary | ICD-10-CM | POA: Diagnosis present

## 2022-03-28 DIAGNOSIS — H9190 Unspecified hearing loss, unspecified ear: Secondary | ICD-10-CM | POA: Diagnosis present

## 2022-03-28 DIAGNOSIS — R0902 Hypoxemia: Secondary | ICD-10-CM | POA: Diagnosis present

## 2022-03-28 DIAGNOSIS — G4730 Sleep apnea, unspecified: Secondary | ICD-10-CM | POA: Diagnosis present

## 2022-03-28 DIAGNOSIS — E039 Hypothyroidism, unspecified: Secondary | ICD-10-CM | POA: Diagnosis present

## 2022-03-28 LAB — CBC WITH DIFFERENTIAL/PLATELET
Abs Immature Granulocytes: 0.15 10*3/uL — ABNORMAL HIGH (ref 0.00–0.07)
Basophils Absolute: 0.1 10*3/uL (ref 0.0–0.1)
Basophils Relative: 1 %
Eosinophils Absolute: 0.1 10*3/uL (ref 0.0–0.5)
Eosinophils Relative: 1 %
HCT: 36.3 % — ABNORMAL LOW (ref 39.0–52.0)
Hemoglobin: 12.3 g/dL — ABNORMAL LOW (ref 13.0–17.0)
Immature Granulocytes: 1 %
Lymphocytes Relative: 14 %
Lymphs Abs: 1.7 10*3/uL (ref 0.7–4.0)
MCH: 30.2 pg (ref 26.0–34.0)
MCHC: 33.9 g/dL (ref 30.0–36.0)
MCV: 89.2 fL (ref 80.0–100.0)
Monocytes Absolute: 0.9 10*3/uL (ref 0.1–1.0)
Monocytes Relative: 7 %
Neutro Abs: 9.4 10*3/uL — ABNORMAL HIGH (ref 1.7–7.7)
Neutrophils Relative %: 76 %
Platelets: 293 10*3/uL (ref 150–400)
RBC: 4.07 MIL/uL — ABNORMAL LOW (ref 4.22–5.81)
RDW: 11.8 % (ref 11.5–15.5)
WBC: 12.3 10*3/uL — ABNORMAL HIGH (ref 4.0–10.5)
nRBC: 0 % (ref 0.0–0.2)

## 2022-03-28 LAB — COMPREHENSIVE METABOLIC PANEL
ALT: 22 U/L (ref 0–44)
AST: 21 U/L (ref 15–41)
Albumin: 2.5 g/dL — ABNORMAL LOW (ref 3.5–5.0)
Alkaline Phosphatase: 56 U/L (ref 38–126)
Anion gap: 11 (ref 5–15)
BUN: 13 mg/dL (ref 6–20)
CO2: 25 mmol/L (ref 22–32)
Calcium: 7.3 mg/dL — ABNORMAL LOW (ref 8.9–10.3)
Chloride: 100 mmol/L (ref 98–111)
Creatinine, Ser: 1.32 mg/dL — ABNORMAL HIGH (ref 0.61–1.24)
GFR, Estimated: >60 mL/min (ref >60)
Glucose, Bld: 103 mg/dL — ABNORMAL HIGH (ref 70–99)
Potassium: 3.5 mmol/L (ref 3.5–5.1)
Sodium: 136 mmol/L (ref 135–145)
Total Bilirubin: 0.5 mg/dL (ref 0.3–1.2)
Total Protein: 6.4 g/dL — ABNORMAL LOW (ref 6.5–8.1)

## 2022-03-28 LAB — APTT: aPTT: 28 seconds (ref 24–36)

## 2022-03-28 LAB — PROTIME-INR
INR: 1.1 (ref 0.8–1.2)
Prothrombin Time: 14.4 seconds (ref 11.4–15.2)

## 2022-03-28 LAB — RESP PANEL BY RT-PCR (FLU A&B, COVID) ARPGX2
Influenza A by PCR: NEGATIVE
Influenza B by PCR: NEGATIVE
SARS Coronavirus 2 by RT PCR: NEGATIVE

## 2022-03-28 LAB — LACTIC ACID, PLASMA: Lactic Acid, Venous: 1.1 mmol/L (ref 0.5–1.9)

## 2022-03-28 MED ORDER — LACTATED RINGERS IV SOLN: INTRAVENOUS | Status: AC

## 2022-03-28 MED ORDER — ONDANSETRON HCL 4 MG PO TABS: 4.0000 mg | ORAL_TABLET | Freq: Four times a day (QID) | ORAL | Status: DC | PRN

## 2022-03-28 MED ORDER — SODIUM CHLORIDE 0.9 % IV SOLN
500.0000 mg | INTRAVENOUS | Status: DC
  Administered 2022-03-28 – 2022-03-29 (×2): 500 mg via INTRAVENOUS
  Filled 2022-03-28 (×2): qty 5

## 2022-03-28 MED ORDER — ACETAMINOPHEN 325 MG PO TABS: 650.0000 mg | ORAL_TABLET | Freq: Four times a day (QID) | ORAL | Status: DC | PRN

## 2022-03-28 MED ORDER — ATORVASTATIN CALCIUM 10 MG PO TABS
10.0000 mg | ORAL_TABLET | Freq: Every day | ORAL | Status: DC
  Administered 2022-03-29 – 2022-03-30 (×2): 10 mg via ORAL
  Filled 2022-03-28 (×2): qty 1

## 2022-03-28 MED ORDER — ALBUTEROL SULFATE (2.5 MG/3ML) 0.083% IN NEBU: 2.5000 mg | INHALATION_SOLUTION | Freq: Four times a day (QID) | RESPIRATORY_TRACT | Status: DC | PRN

## 2022-03-28 MED ORDER — ENOXAPARIN SODIUM 40 MG/0.4ML IJ SOSY
40.0000 mg | PREFILLED_SYRINGE | INTRAMUSCULAR | Status: DC
  Administered 2022-03-28 – 2022-03-29 (×2): 40 mg via SUBCUTANEOUS
  Filled 2022-03-28 (×2): qty 0.4

## 2022-03-28 MED ORDER — SODIUM CHLORIDE 0.9 % IV SOLN
2.0000 g | INTRAVENOUS | Status: DC
  Administered 2022-03-28 – 2022-03-29 (×2): 2 g via INTRAVENOUS
  Filled 2022-03-28 (×2): qty 20

## 2022-03-28 MED ORDER — GUAIFENESIN ER 600 MG PO TB12
600.0000 mg | ORAL_TABLET | Freq: Two times a day (BID) | ORAL | Status: DC
  Administered 2022-03-28 – 2022-03-30 (×4): 600 mg via ORAL
  Filled 2022-03-28 (×4): qty 1

## 2022-03-28 MED ORDER — LEVOTHYROXINE SODIUM 75 MCG PO TABS
150.0000 ug | ORAL_TABLET | Freq: Every day | ORAL | Status: DC
  Administered 2022-03-29 – 2022-03-30 (×2): 150 ug via ORAL
  Filled 2022-03-28 (×2): qty 2

## 2022-03-28 MED ORDER — LEVOFLOXACIN IN D5W 750 MG/150ML IV SOLN: 750.0000 mg | Freq: Once | INTRAVENOUS | Status: DC

## 2022-03-28 MED ORDER — ONDANSETRON HCL 4 MG/2ML IJ SOLN: 4.0000 mg | Freq: Four times a day (QID) | INTRAMUSCULAR | Status: DC | PRN

## 2022-03-28 MED ORDER — ACETAMINOPHEN 650 MG RE SUPP: 650.0000 mg | Freq: Four times a day (QID) | RECTAL | Status: DC | PRN

## 2022-03-28 MED ORDER — LACTATED RINGERS IV SOLN: INTRAVENOUS | Status: DC

## 2022-03-28 MED ORDER — SENNOSIDES-DOCUSATE SODIUM 8.6-50 MG PO TABS: 1.0000 | ORAL_TABLET | Freq: Every evening | ORAL | Status: DC | PRN

## 2022-03-28 MED ORDER — SODIUM CHLORIDE 0.9 % IV BOLUS: 1000.0000 mL | Freq: Once | INTRAVENOUS | Status: DC

## 2022-03-28 MED ORDER — LACTATED RINGERS IV BOLUS (SEPSIS)
1000.0000 mL | Freq: Once | INTRAVENOUS | Status: AC
  Administered 2022-03-28: 1000 mL via INTRAVENOUS

## 2022-03-28 NOTE — ED Provider Notes (Signed)
[facility] EMERGENCY DEPARTMENT Provider Note   CSN: [id number] Arrival date & time: 03/28/22  1713     History  Chief Complaint  Patient presents with   Shortness of Breath    Brett Campbell is a 61 y.o. male.  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  Patient states that he was seen at urgent care for his fever and cough when he was diagnosed with pneumonia.  He states he was started on levofloxacin but over the last 2 days has had significant amount of fatigue and has not been able to tolerate much fluids due to lack of appetite.  He denies any vomiting or diarrhea.  He states his fevers have resolved since starting the antibiotics.  He states he is also had worsening dyspnea on exertion and some mild chest discomfort.  He denies any lower extremity swelling.  States that he was seen at urgent care again today and was told that due to his low blood pressures and increasing white blood cell count that they recommended that he come to the emergency department to be evaluated.  The history is provided by the patient and a relative.  Shortness of Breath      Home Medications Prior to Admission medications   Medication Sig Start Date End Date Taking? Authorizing Provider  amLODipine (NORVASC) 5 MG tablet Take 1 tablet (5 mg total) by mouth daily. 09/23/21  Yes Boscia, Greer Ee, NP  atorvastatin (LIPITOR) 10 MG tablet Take 1 tablet (10 mg total) by mouth daily. 09/23/21  Yes Boscia, Greer Ee, NP  calcitRIOL (ROCALTROL) 0.25 MCG capsule Take 1 capsule (0.25 mcg total) by mouth daily. Patient taking differently: Take 0.5 mcg by mouth daily. 12/26/21  Yes Shamleffer, Melanie Crazier, MD  Calcium Carbonate-Vit D-Min (CALTRATE 600+D PLUS MINERALS) 600-800  MG-UNIT CHEW Chew 1,200 mg by mouth 2 (two) times daily. Patient taking differently: Chew 2 tablets by mouth 2 (two) times daily. 11/22/20  Yes Shamleffer, Melanie Crazier, MD  ibuprofen (ADVIL) 200 MG tablet Take 400-800 mg by mouth every 6 (six) hours as needed for moderate pain.   Yes [provider]  levofloxacin (LEVAQUIN) 750 MG tablet Take 1 tablet (750 mg total) by mouth daily. 03/25/22  Yes Raspet, Derry Skill, PA-C  levothyroxine (SYNTHROID) 150 MCG tablet Take 1 tablet (150 mcg total) by mouth daily. 12/26/21  Yes Shamleffer, Melanie Crazier, MD  lisinopril (ZESTRIL) 10 MG tablet Take 1 tablet (10 mg total) by mouth daily. 09/23/21  Yes Boscia, Heather E, NP  potassium chloride SA (KLOR-CON M) 20 MEQ tablet Take 1 tablet (20 mEq total) by mouth daily. 09/23/21  Yes [name], NP      Allergies    Penicillins    Review of Systems   Review of Systems  Respiratory:  Positive for shortness of breath.     Physical Exam Updated Vital Signs BP (!) 96/55   Pulse 69   Temp 98.2 F (36.8 C)   Resp 15   SpO2 97%  Physical Exam Vitals and nursing note reviewed.  Constitutional:      General: He is not in acute distress.    Appearance: He is well-developed.  HENT:     Head: Normocephalic and atraumatic.     Mouth/Throat:     Comments: Mucous  membranes dry Eyes:     Extraocular Movements: Extraocular movements intact.  Cardiovascular:     Rate and Rhythm: Normal rate and regular rhythm.  Pulmonary:     Effort: Pulmonary effort is normal.     Breath sounds: Examination of the left-lower field reveals rales. Rales present.  Abdominal:     Palpations: Abdomen is soft.     Tenderness: There is no abdominal tenderness.  Musculoskeletal:        General: Normal range of motion.     Cervical back: Normal range of motion and neck supple.  Skin:    General: Skin is warm and dry.  Neurological:     General: No focal deficit present.     Mental Status: He is alert and oriented  to person, place, and time.     ED Results / Procedures / Treatments   Labs (all labs ordered are listed, but only abnormal results are displayed) Labs Reviewed  COMPREHENSIVE METABOLIC PANEL - Abnormal; Notable for the following components:      Result Value   Glucose, Bld 103 (*)    Creatinine, Ser 1.32 (*)    Calcium 7.3 (*)    Total Protein 6.4 (*)    Albumin 2.5 (*)    All other components within normal limits  CBC WITH DIFFERENTIAL/PLATELET - Abnormal; Notable for the following components:   WBC 12.3 (*)    RBC 4.07 (*)    Hemoglobin 12.3 (*)    HCT 36.3 (*)    Neutro Abs 9.4 (*)    Abs Immature Granulocytes 0.15 (*)    All other components within normal limits  RESP PANEL BY RT-PCR (FLU A&B, COVID) ARPGX2  CULTURE, BLOOD (ROUTINE X 2)  CULTURE, BLOOD (ROUTINE X 2)  LACTIC ACID, PLASMA  PROTIME-INR  APTT  LACTIC ACID, PLASMA  URINALYSIS, ROUTINE W REFLEX MICROSCOPIC    EKG None  Radiology DG Chest Port 1 View  Result Date: 03/28/2022 CLINICAL DATA:  Sepsis EXAM: PORTABLE CHEST 1 VIEW COMPARISON:  03/25/2022 FINDINGS: Single frontal view of the chest demonstrates persistent left lower lobe airspace disease compatible with pneumonia. Small left effusion cannot be excluded. Right chest is clear. No pneumothorax. Cardiac silhouette is stable. No acute bony abnormalities. IMPRESSION: 1. Stable left lower lobe pneumonia. Followup PA and lateral chest X-ray is recommended in 3-4 weeks following trial of antibiotic therapy to ensure resolution and exclude underlying malignancy. Electronically Signed   By: Randa Ngo M.D.   On: 03/28/2022 18:26    Procedures Procedures    Medications Ordered in ED Medications  lactated ringers infusion ( Intravenous New Bag/Given 03/28/22 2007)  cefTRIAXone (ROCEPHIN) 2 g in sodium chloride 0.9 % 100 mL IVPB (0 g Intravenous Stopped 03/28/22 2006)  azithromycin (ZITHROMAX) 500 mg in sodium chloride 0.9 % 250 mL IVPB (500 mg Intravenous  New Bag/Given 03/28/22 2008)  sodium chloride 0.9 % bolus 1,000 mL (has no administration in time range)  lactated ringers bolus 1,000 mL (1,000 mLs Intravenous New Bag/Given 03/28/22 1955)    ED Course/ Medical Decision Making/ A&P Clinical Course as of 03/28/22 2118  Sat Mar 28, 2022  1849 Chest x-ray shows stable left lower lobe pneumonia, labs pending at this time. [VK]  2100 Patient's creatinine was reviewed and interpreted by myself is at his baseline of 1.3.  He is continue to have soft blood pressures and will be given second liter of fluids.  Antibiotics are in process.  He will be admitted for further  management of his pneumonia with failed outpatient treatment. [VK]    Clinical Course User Index [VK] Ottie Glazier, DO                           Medical Decision Making Patient is a 61 year old male with a past medical history of deafness, hypothyroidism, hypertension presenting to the emergency department with worsening shortness of breath and decreased appetite after being diagnosed with pneumonia.  Patient was hypotensive at urgent care with a systolic in the 20N and on arrival here her systolic improved spontaneously to the 100s to 120s, concerning for dehydration or sepsis in the setting of his pneumonia.  He will be started on 1 L IV fluids.  He will have further sepsis work-up performed with labs, repeat chest x-ray and urine.  Patient will require IV antibiotics as he is failing his outpatient treatment.  Per the family, he was also hypoxic to 56s on room air at urgent care but is satting 96% on room air here with concerning for exertional hypoxia.  Amount and/or Complexity of Data Reviewed Labs: ordered. Radiology: ordered.  Risk Prescription drug management. Decision regarding hospitalization.   Patient signed out to hospitalist Dr. Posey Pronto and was accepted to his service.        Final Clinical Impression(s) / ED Diagnoses Final diagnoses:  Community acquired  pneumonia of left lower lobe of lung    Rx / DC Orders ED Discharge Orders     None         Ottie Glazier, DO 03/28/22 2125

## 2022-03-28 NOTE — ED Triage Notes (Signed)
Patient was contacted to come back in for a recheck regarding his blood work. Pt reports being fatigue and not feeling any better.

## 2022-03-28 NOTE — ED Triage Notes (Signed)
Here from [facility] for worsening PNA. Seen at [facility] on 8/2 with fever, malaise, and cough, dx'd with PNA, prescribed levaquin, here for worsening despite 3d of ABT. Productive cough. H/o COPD. Smoker of tobacco and marijuana. SPO2 lower 90% than his usual baseline 97%. Pt is deaf, sister interpreting at [facility]. Pt also reads lips. Alert, NAD, calm.

## 2022-03-28 NOTE — ED Notes (Signed)
Xray at [facility] 

## 2022-03-28 NOTE — ED Notes (Signed)
Xray finished at [facility]

## 2022-03-28 NOTE — Sepsis Progress Note (Signed)
Elink following code sepsis °

## 2022-03-28 NOTE — Discharge Instructions (Addendum)
Patient transported to [facility] Emergency Department via [facility].

## 2022-03-28 NOTE — ED Notes (Signed)
EDP at [facility] 

## 2022-03-28 NOTE — H&P (Signed)
History and Physical    Brett Campbell CHY:[id number] DOB: 20-Feb-1961 DOA: 03/28/2022  PCP: [name], NP  Patient coming from: Home  I have personally briefly reviewed patient's old medical records in [facility]  Chief Complaint: Pneumonia  HPI: Brett Campbell is a 61 y.o. 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  Patient initially went to urgent care on 03/25/2022 for approximately 1 week history of shortness of breath and productive cough.  WBC was 17.5.  2 view chest x-ray was obtained and consistent with left lower lobe pneumonia.  He was stable to discharge to home on a course of oral Levaquin.  He returned to urgent care today (8/5) with worsening shortness of breath, fatigue, generalized weakness, poor oral intake, fatigue.  Has continued cough productive of yellow/white phlegm.  He was noted to have low blood pressure of 89/52 initially, improved to 104/70s on recheck.  He was sent to the ED for further evaluation and management.  Patient reports continued productive cough, fatigue.  Shortness of breath improved with rest.  He denies any chest pain, fevers, nausea, vomiting, abdominal pain.  He reports ongoing tobacco use, smokes about 3 cigarettes/day.  ED Course  Labs/Imaging on admission: I have personally reviewed following labs and imaging studies.  Initial vitals showed BP 97/57, pulse 74, RR 12, temp 98.2 F, SPO2 95% on room air.  While in the ED patient was tachypneic with RR up to 26.  Labs show WBC 12.3, hemoglobin 12.3, platelets 293,000, sodium 136, potassium 3.5, bicarb 25, BUN 13, creatinine 1.32, serum glucose 103, albumin 2.5, corrected calcium 8.5, LFTs within normal limits, lactic acid 1.1.  SARS-CoV-2 and influenza PCR negative.  Blood cultures in  process.  Portable chest x-ray shows stable left lower lobe pneumonia.  Patient was given 1 L LR, 1 L NS, and started on IV ceftriaxone and azithromycin.  The hospitalist service was consulted to admit for further evaluation and management.  Review of Systems: All systems reviewed and are negative except as documented in history of present illness above.   Past Medical History:  Diagnosis Date   Calcium deficiency    Cataract    COPD (chronic obstructive pulmonary disease) ([place])    Deaf    Hypertension    Hypothyroidism    Personal history of colonic polyps 11/03/2020   Scarlet fever    Sleep apnea    no longer uses CPAP   Thyroid disease    Tuberculosis    [place] Health department is contact with him due to hx.    Past Surgical History:  Procedure Laterality Date   CHOLECYSTECTOMY     COLONOSCOPY     +5years   LAPAROSCOPIC RIGHT HEMI COLECTOMY N/A 02/26/2021   Procedure: LAPAROSCOPIC RIGHT HEMI COLECTOMY;  Surgeon: Ileana Roup, MD;  Location: [facility] ORS;  Service: General;  Laterality: N/A;   THYROIDECTOMY      Social History:  reports that he has been smoking cigarettes. He has a 11.25 pack-year smoking history. He has never used smokeless tobacco. He reports current alcohol use of about 1.0 - 2.0 standard drink of alcohol per week. He reports current drug use. Drug: Marijuana.  Allergies  Allergen Reactions   Penicillins     Unknown  Did it involve swelling of the face/tongue/throat, SOB, or low BP? Unknown Did it  involve sudden or severe rash/hives, skin peeling, or any reaction on the inside of your mouth or nose? Unknown Did you need to seek medical attention at a hospital or doctor's office? Unknown When did it last happen?     childhood  If all above answers are "NO", may proceed with cephalosporin use.      Family History  Problem Relation Age of Onset   CAD Mother    Diabetes Mellitus II Mother    Diabetes Mother    CAD Father    Heart attack  Father    Heart disease Father    Diabetes Sister    Colon cancer Neg Hx    Esophageal cancer Neg Hx    Stomach cancer Neg Hx    Colon polyps Neg Hx      Prior to Admission medications   Medication Sig Start Date End Date Taking? Authorizing Provider  amLODipine (NORVASC) 5 MG tablet Take 1 tablet (5 mg total) by mouth daily. 09/23/21  Yes Boscia, Greer Ee, NP  atorvastatin (LIPITOR) 10 MG tablet Take 1 tablet (10 mg total) by mouth daily. 09/23/21  Yes Boscia, Greer Ee, NP  calcitRIOL (ROCALTROL) 0.25 MCG capsule Take 1 capsule (0.25 mcg total) by mouth daily. Patient taking differently: Take 0.5 mcg by mouth daily. 12/26/21  Yes Shamleffer, Melanie Crazier, MD  Calcium Carbonate-Vit D-Min (CALTRATE 600+D PLUS MINERALS) 600-800 MG-UNIT CHEW Chew 1,200 mg by mouth 2 (two) times daily. Patient taking differently: Chew 2 tablets by mouth 2 (two) times daily. 11/22/20  Yes Shamleffer, Melanie Crazier, MD  ibuprofen (ADVIL) 200 MG tablet Take 400-800 mg by mouth every 6 (six) hours as needed for moderate pain.   Yes [provider]  levofloxacin (LEVAQUIN) 750 MG tablet Take 1 tablet (750 mg total) by mouth daily. 03/25/22  Yes Raspet, Derry Skill, PA-C  levothyroxine (SYNTHROID) 150 MCG tablet Take 1 tablet (150 mcg total) by mouth daily. 12/26/21  Yes Shamleffer, Melanie Crazier, MD  lisinopril (ZESTRIL) 10 MG tablet Take 1 tablet (10 mg total) by mouth daily. 09/23/21  Yes Boscia, Heather E, NP  potassium chloride SA (KLOR-CON M) 20 MEQ tablet Take 1 tablet (20 mEq total) by mouth daily. 09/23/21  Yes [name], NP    Physical Exam: Vitals:   03/28/22 1845 03/28/22 1900 03/28/22 1956 03/28/22 2000  BP: 118/61 113/69  (!) 96/55  Pulse: 71 63  69  Resp: (!) 25 13  15   Temp:   98.2 F (36.8 C)   SpO2: 96% 96%  97%   Constitutional: Resting in bed with head elevated, NAD, calm, comfortable Eyes: EOMI, lids and conjunctivae normal ENMT: Hearing impaired.  Mucous membranes are dry.  Posterior pharynx clear of any exudate or lesions.Normal dentition.  Neck: normal, supple, no masses. Respiratory: Inspiratory crackles left lower lung field.  Normal respiratory effort while at rest. No accessory muscle use.  Cardiovascular: Regular rate and rhythm, no murmurs / rubs / gallops. No extremity edema. 2+ pedal pulses. Abdomen: no tenderness, no masses palpated.  Musculoskeletal: no clubbing / cyanosis. No joint deformity upper and lower extremities. Good ROM, no contractures. Normal muscle tone.  Skin: no rashes, lesions, ulcers. No induration Neurologic: Sensation intact. Strength 5/5 in all 4.  Psychiatric: Alert and oriented x 3.  EKG: Personally reviewed. Sinus rhythm, no acute ischemic changes.  Similar to prior.  Assessment/Plan Principal Problem:   Sepsis due to left lower lobe pneumonia [facility]) Active Problems:   Essential hypertension  Acquired hypothyroidism   Mixed hyperlipidemia   Brett Campbell is a 61 y.o. 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  Assessment and Plan: * Sepsis due to left lower lobe pneumonia ([place]) Presenting with leukocytosis, tachypnea, hypotension due to left lower lobe pneumonia.  Failed outpatient antibiotics with Levaquin.  Per report had exertional hypoxia at urgent care, currently saturating well on room air while at rest. -Started on IV ceftriaxone and azithromycin -Follow blood cultures -Check sputum culture, strep pneumonia urinary antigen -Continue maintenance IV fluid hydration overnight -Supplemental oxygen as needed  Essential hypertension Hold home lisinopril and amlodipine due to hypotension on arrival.  Mixed hyperlipidemia Continue atorvastatin.  Acquired hypothyroidism Continue  Synthroid.  DVT prophylaxis: enoxaparin (LOVENOX) injection 40 mg Start: 03/28/22 2200 Code Status: Full code Family Communication: Discussed with patient Disposition Plan: From home and likely discharge to home pending clinical progress Consults called: None Severity of Illness: The appropriate patient status for this patient is INPATIENT. Inpatient status is judged to be reasonable and necessary in order to provide the required intensity of service to ensure the patient's safety. The patient's presenting symptoms, physical exam findings, and initial radiographic and laboratory data in the context of their chronic comorbidities is felt to place them at high risk for further clinical deterioration. Furthermore, it is not anticipated that the patient will be medically stable for discharge from the hospital within 2 midnights of admission.   * I certify that at the point of admission it is my clinical judgment that the patient will require inpatient hospital care spanning beyond 2 midnights from the point of admission due to high intensity of service, high risk for further deterioration and high frequency of surveillance required.Zada Finders MD [facility]  If 7PM-7AM, please contact night-coverage [email or web address]  03/28/2022, 9:48 PM

## 2022-03-28 NOTE — ED Provider Notes (Signed)
[facility]    CSN: [id number] Arrival date & time: 03/28/22  1430      History   Chief Complaint Chief Complaint  Patient presents with   Follow-up    HPI Brett Campbell is a 61 y.o. male.   Patient was called couple of days ago and instructed to come to urgent care for repeat labs after pneumonia diagnosis with elevated white blood cell count, decreased albumin level, and stable kidney function.  Patient has been taking his Levaquin as prescribed at urgent care visit 2 days ago after initial pneumonia diagnosis via chest x-ray.  He states that he has experienced worsening shortness of breath, weakness, fatigue, and decreased appetite over the last couple of days.  He has been laying in the bed mostly and has not been very active.  Denies chest pain, wheezing, nausea, vomiting, abdominal pain, fever/chills, stuffy nose, headache, eye drainage, and heart palpitations.  His cough is worse at nighttime and he is coughing up white/yellow phlegm.  He has been using his inhaler as needed.  Patient is a smoker and sometimes smokes cigarettes, but mostly smokes marijuana. 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 Sister provides interpretation when needed.      Past Medical History:  Diagnosis Date   Calcium deficiency    Cataract    COPD (chronic obstructive pulmonary disease) ([place])    Deaf    Hypertension    Hypothyroidism    Personal history of colonic polyps 11/03/2020   [place]ar[place] fever    Sleep apnea    no longer uses CPAP   Thyroid disease    Tuberculosis    [place] department is contact with him due to hx.    Patient Active Problem List   Diagnosis Date Noted   Hypokalemia 09/28/2021   Hypocalcemia 09/28/2021   Need for shingles vaccine 09/28/2021   Tobacco use disorder, continuous 09/28/2021   Essential hypertension 08/03/2021   Mixed hyperlipidemia 08/03/2021   Calcium deficiency 08/03/2021   Body mass index 26.0-26.9, adult 08/03/2021   S/P  right hemicolectomy 02/26/2021   Genetic testing 02/14/2021   Other hypoparathyroidism ([place]) 11/22/2020   Acquired hypothyroidism 11/22/2020   Personal history of colonic polyps 11/03/2020    Past Surgical History:  Procedure Laterality Date   CHOLECYSTECTOMY     COLONOSCOPY     +5years   LAPAROSCOPIC RIGHT HEMI COLECTOMY N/A 02/26/2021   Procedure: LAPAROSCOPIC RIGHT HEMI COLECTOMY;  Surgeon: Ileana Roup, MD;  Location: [facility];  Service: General;  Laterality: N/A;   THYROIDECTOMY         Home Medications    Prior to Admission medications   Medication Sig Start Date End Date Taking? Authorizing Provider  amLODipine (NORVASC) 5 MG tablet Take 1 tablet (5 mg total) by mouth daily. 09/23/21   [name], NP  atorvastatin (LIPITOR) 10 MG tablet Take 1 tablet (10 mg total) by mouth daily. 09/23/21   [name], NP  calcitRIOL (ROCALTROL) 0.25 MCG capsule Take 1 capsule (0.25 mcg total) by mouth daily. 12/26/21   Shamleffer, Melanie Crazier, MD  Calcium Carbonate-Vit D-Min (CALTRATE 600+D PLUS MINERALS) 600-800 MG-UNIT CHEW Chew 1,200 mg by mouth 2 (two) times daily. Patient taking differently: Chew 2 tablets by mouth 2 (two) times daily. 11/22/20   Shamleffer, Melanie Crazier, MD  ibuprofen (ADVIL) 200 MG tablet Take 400-800 mg by mouth every 6 (six) hours as needed for moderate pain.    [provider]  levofloxacin (LEVAQUIN) 750 MG tablet Take 1 tablet (750 mg total) by mouth daily. 03/25/22   Raspet, Derry Skill, PA-C  levothyroxine (SYNTHROID) 150 MCG tablet Take 1 tablet (150 mcg total) by mouth daily. 12/26/21   Shamleffer, Melanie Crazier, MD  lisinopril (ZESTRIL) 10 MG tablet Take 1 tablet (10 mg total) by mouth daily. 09/23/21   [name], NP  potassium chloride SA (KLOR-CON M) 20 MEQ tablet Take 1 tablet (20 mEq total) by mouth daily. 09/23/21   [name], NP    Family History Family History  Problem Relation Age of Onset   CAD Mother     Diabetes Mellitus II Mother    Diabetes Mother    CAD Father    Heart attack Father    Heart disease Father    Diabetes Sister    Colon cancer Neg Hx    Esophageal cancer Neg Hx    Stomach cancer Neg Hx    Colon polyps Neg Hx     Social History Social History   Tobacco Use   Smoking status: Some Days    Packs/day: 0.25    Years: 45.00    Total pack years: 11.25    Types: Cigarettes   Smokeless tobacco: Never   Tobacco comments:    pt states he smokes 1 cigarette per day  Vaping Use   Vaping Use: Never used  Substance Use Topics   Alcohol use: Yes    Alcohol/week: 1.0 - 2.0 standard drink of alcohol    Types: 1 - 2 Cans of beer per week    Comment: occas   Drug use: Yes    Types: Marijuana     Allergies   Penicillins   Review of Systems Review of Systems Per HPI  Physical Exam Triage Vital Signs ED Triage Vitals [03/28/22 1521]  Enc Vitals Group     BP (!) 89/52     Pulse Rate 74     Resp 16     Temp 98.1 F (36.7 C)     Temp Source Oral     SpO2 93 %     Weight      Height      Head Circumference      Peak Flow      Pain Score      Pain Loc      Pain Edu?      Excl. in [place]?    No data found.  Updated Vital Signs BP (!) 89/52 (BP Location: Left Arm) Comment: 94/64  Pulse 74   Temp 98.1 F (36.7 C) (Oral)   Resp 16   SpO2 93%   Visual Acuity Right Eye Distance:   Left Eye Distance:   Bilateral Distance:    Right Eye Near:   Left Eye Near:    Bilateral Near:     Physical Exam Vitals and nursing note reviewed.  Constitutional:      Appearance: Normal appearance. He is not ill-appearing or toxic-appearing.     Comments: Very pleasant patient sitting on exam in position of comfort table with visible increased respiratory effort. Able to speak in full sentences without difficulty.   HENT:     Head: Normocephalic and atraumatic.     Right Ear: Hearing and external ear normal.     Left Ear: Hearing and external ear normal.     Nose:  Nose normal.     Mouth/Throat:     Lips: Pink.     Mouth: Mucous  membranes are moist.  Eyes:     General: Lids are normal. Vision grossly intact. Gaze aligned appropriately.     Extraocular Movements: Extraocular movements intact.     Conjunctiva/sclera: Conjunctivae normal.  Cardiovascular:     Rate and Rhythm: Normal rate and regular rhythm.     Heart sounds: Normal heart sounds, S1 normal and S2 normal.  Pulmonary:     Effort: Tachypnea present. No respiratory distress.     Breath sounds: Normal air entry. Examination of the right-lower field reveals rhonchi. Examination of the left-lower field reveals rhonchi. Rhonchi present.     Comments: Faint rhonchi heard to bilateral lower lung fields to auscultation. Airway is intact.  Abdominal:     Palpations: Abdomen is soft.  Musculoskeletal:     Cervical back: Neck supple.  Skin:    General: Skin is warm and dry.     Capillary Refill: Capillary refill takes less than 2 seconds.     Findings: No rash.  Neurological:     General: No focal deficit present.     Mental Status: He is alert and oriented to person, place, and time. Mental status is at baseline.     Cranial Nerves: No dysarthria or facial asymmetry.     Gait: Gait is intact.  Psychiatric:        Mood and Affect: Mood normal.        Speech: Speech normal.        Behavior: Behavior normal.        Thought Content: Thought content normal.        Judgment: Judgment normal.      UC Treatments / Results  Labs (all labs ordered are listed, but only abnormal results are displayed) Labs Reviewed - No data to display  EKG   Radiology No results found.  Procedures Procedures (including critical care time)  Medications Ordered in UC Medications - No data to display  Initial Impression / Assessment and Plan / UC Course  I have reviewed the triage vital signs and the nursing notes.  Pertinent labs & imaging results that were available during my care of the patient  were reviewed by me and considered in my medical decision making (see chart for details).  1.  Community-acquired pneumonia of the left lower lobe and shortness of breath Patient appears to have worsened clinically over the last couple of days of antibiotic use.  I am concerned and suspicious of sepsis etiology at this time due to worsening cough and tachypnea/increased respiratory effort.  Blood pressure is soft upon arrival to urgent care at 89/52 but improved to 104 over 70s upon recheck.  Due to worsening signs and symptoms, I recommend that patient go to the emergency room for further evaluation and management to rule out acute septic pneumonia.  Patient and sister are agreeable with this plan.  I am not comfortable with patient going to the emergency room by personal vehicle due to clinical picture and soft blood pressure.  His oxygen is normally 97% and is 93% to urgent care.  Recommend patient go to the emergency department via [place].  Sister and patient verbalized agreement with this plan.  [facility] called to urgent care and patient discharged from urgent care in stable condition to the nearest emergency room.  Final Clinical Impressions(s) / UC Diagnoses   Final diagnoses:  Community acquired pneumonia of left lower lobe of lung  Shortness of breath     Discharge Instructions      Patient  transported to [facility] Emergency Department via [place].      ED Prescriptions   None    PDMP not reviewed this encounter.   Talbot Grumbling, [place] 03/28/22 2026

## 2022-03-28 NOTE — Hospital Course (Signed)
Brett Campbell is a 61 y.o. 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

## 2022-03-28 NOTE — Assessment & Plan Note (Signed)
Continue Synthroid °

## 2022-03-28 NOTE — ED Notes (Signed)
Patient is being discharged from the Urgent Care and sent to the Emergency Department via [place] . Per provider, patient is in need of higher level of care due to history of pnuemonia. Patient is aware and verbalizes understanding of plan of care.  Vitals:   03/28/22 1521  BP: (!) 89/52  Pulse: 74  Resp: 16  Temp: 98.1 F (36.7 C)  SpO2: 93%

## 2022-03-28 NOTE — Assessment & Plan Note (Signed)
Hold home lisinopril and amlodipine due to hypotension on arrival.

## 2022-03-28 NOTE — Assessment & Plan Note (Signed)
Continue atorvastatin

## 2022-03-28 NOTE — ED Notes (Signed)
Pt alert, NAD, calm, interactive, resps e/u. Sister at [facility]

## 2022-03-28 NOTE — ED Notes (Signed)
Admitting in to see

## 2022-03-28 NOTE — Sepsis Progress Note (Signed)
Communication took place via [facility] with primary provider regarding fluid resuscitation.

## 2022-03-28 NOTE — Assessment & Plan Note (Signed)
Presenting with leukocytosis, tachypnea, hypotension due to left lower lobe pneumonia.  Failed outpatient antibiotics with Levaquin.  Per report had exertional hypoxia at urgent care, currently saturating well on room air while at rest. -Started on IV ceftriaxone and azithromycin -Follow blood cultures -Check sputum culture, strep pneumonia urinary antigen -Continue maintenance IV fluid hydration overnight -Supplemental oxygen as needed

## 2022-03-29 DIAGNOSIS — J189 Pneumonia, unspecified organism: Secondary | ICD-10-CM | POA: Diagnosis not present

## 2022-03-29 DIAGNOSIS — A419 Sepsis, unspecified organism: Secondary | ICD-10-CM | POA: Diagnosis not present

## 2022-03-29 LAB — COMPREHENSIVE METABOLIC PANEL
ALT: 18 U/L (ref 0–44)
AST: 18 U/L (ref 15–41)
Albumin: 2.2 g/dL — ABNORMAL LOW (ref 3.5–5.0)
Alkaline Phosphatase: 47 U/L (ref 38–126)
Anion gap: 10 (ref 5–15)
BUN: 10 mg/dL (ref 6–20)
CO2: 24 mmol/L (ref 22–32)
Calcium: 6.8 mg/dL — ABNORMAL LOW (ref 8.9–10.3)
Chloride: 104 mmol/L (ref 98–111)
Creatinine, Ser: 1.11 mg/dL (ref 0.61–1.24)
GFR, Estimated: >60 mL/min (ref >60)
Glucose, Bld: 106 mg/dL — ABNORMAL HIGH (ref 70–99)
Potassium: 3.1 mmol/L — ABNORMAL LOW (ref 3.5–5.1)
Sodium: 138 mmol/L (ref 135–145)
Total Bilirubin: 0.7 mg/dL (ref 0.3–1.2)
Total Protein: 5.7 g/dL — ABNORMAL LOW (ref 6.5–8.1)

## 2022-03-29 LAB — URINALYSIS, ROUTINE W REFLEX MICROSCOPIC
Bilirubin Urine: NEGATIVE
Glucose, UA: NEGATIVE mg/dL
Hgb urine dipstick: NEGATIVE
Ketones, ur: 20 mg/dL — AB
Leukocytes,Ua: NEGATIVE
Nitrite: NEGATIVE
Protein, ur: NEGATIVE mg/dL
Specific Gravity, Urine: 1.014 (ref 1.005–1.030)
pH: 5 (ref 5.0–8.0)

## 2022-03-29 LAB — CBC
HCT: 33.4 % — ABNORMAL LOW (ref 39.0–52.0)
Hemoglobin: 11.5 g/dL — ABNORMAL LOW (ref 13.0–17.0)
MCH: 30.3 pg (ref 26.0–34.0)
MCHC: 34.4 g/dL (ref 30.0–36.0)
MCV: 88.1 fL (ref 80.0–100.0)
Platelets: 250 10*3/uL (ref 150–400)
RBC: 3.79 MIL/uL — ABNORMAL LOW (ref 4.22–5.81)
RDW: 11.9 % (ref 11.5–15.5)
WBC: 10.6 10*3/uL — ABNORMAL HIGH (ref 4.0–10.5)
nRBC: 0 % (ref 0.0–0.2)

## 2022-03-29 LAB — PROCALCITONIN: Procalcitonin: 0.17 ng/mL

## 2022-03-29 LAB — LACTIC ACID, PLASMA: Lactic Acid, Venous: 1 mmol/L (ref 0.5–1.9)

## 2022-03-29 LAB — HIV ANTIBODY (ROUTINE TESTING W REFLEX): HIV Screen 4th Generation wRfx: NONREACTIVE

## 2022-03-29 LAB — STREP PNEUMONIAE URINARY ANTIGEN: Strep Pneumo Urinary Antigen: NEGATIVE

## 2022-03-29 LAB — MRSA NEXT GEN BY PCR, NASAL: MRSA by PCR Next Gen: NOT DETECTED

## 2022-03-29 MED ORDER — POTASSIUM CHLORIDE CRYS ER 20 MEQ PO TBCR
40.0000 meq | EXTENDED_RELEASE_TABLET | Freq: Once | ORAL | Status: AC
  Administered 2022-03-29: 40 meq via ORAL
  Filled 2022-03-29: qty 2

## 2022-03-29 MED ORDER — POTASSIUM CHLORIDE CRYS ER 20 MEQ PO TBCR
20.0000 meq | EXTENDED_RELEASE_TABLET | Freq: Once | ORAL | Status: AC
  Administered 2022-03-29: 20 meq via ORAL
  Filled 2022-03-29: qty 1

## 2022-03-29 MED ORDER — LACTATED RINGERS IV SOLN: INTRAVENOUS | Status: AC

## 2022-03-29 MED ORDER — HYDRALAZINE HCL 20 MG/ML IJ SOLN: 10.0000 mg | Freq: Four times a day (QID) | INTRAMUSCULAR | Status: DC | PRN

## 2022-03-29 MED ORDER — ORAL CARE MOUTH RINSE: 15.0000 mL | OROMUCOSAL | Status: DC | PRN

## 2022-03-29 NOTE — Progress Notes (Signed)
SATURATION QUALIFICATIONS: (This note is used to comply with regulatory documentation for home oxygen)  Patient Saturations on Room Air at Rest = 96%  Patient Saturations on Room Air while Ambulating = 93%  Patient Saturations on 0 Liters of oxygen while Ambulating = 93%  Please briefly explain why patient needs home oxygen: Patient does not qualify for home oxygen.   Patient walk approximately 200 feet.

## 2022-03-29 NOTE — Evaluation (Signed)
Physical Therapy Evaluation and Discharge Patient Details Name: Brett Campbell MRN: [id number] DOB: 1961-02-04 Today's Date: 03/29/2022  History of Present Illness  61 y.o. male who presented to the ED 03/28/22 for management of pneumonia. 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 Patient ambulated independently x 170 ft with sudden stops and turns. Able to complete functional tasks in room without imbalance and denies h/o imbalance or falls.  PT is signing off. Thank you for this referral.        Recommendations for follow up therapy are one component of a multi-disciplinary discharge planning process, led by the attending physician.  Recommendations may be updated based on patient status, additional functional criteria and insurance authorization.  Follow Up Recommendations No PT follow up      Assistance Recommended at Discharge None  Patient can return home with the following       Equipment Recommendations None recommended by PT  Recommendations for Other Services       Functional Status Assessment Patient has not had a recent decline in their functional status     Precautions / Restrictions Precautions Precautions: None      Mobility  Bed Mobility Overal bed mobility: Independent                  Transfers Overall transfer level: Independent Equipment used: None                    Ambulation/Gait Ambulation/Gait assistance: Independent Gait Distance (Feet): 170 Feet Assistive device: None Gait Pattern/deviations: WFL(Within Functional Limits)   Gait velocity interpretation: >2.62 ft/sec, indicative of community ambulatory      Stairs            Wheelchair Mobility    Modified Rankin (Stroke Patients Only)        Balance Overall balance assessment: Independent (able to stand with feet together EO, with EC with +sway; able to turn circles,  reach overhead)                               Standardized Balance Assessment Standardized Balance Assessment :  (difficult to assess due to hearing deficit and having to write out all instructions)           Pertinent Vitals/Pain Pain Assessment Pain Assessment: No/denies pain    Home Living Family/patient expects to be discharged to:: Private residence Living Arrangements: Other relatives (sister)                      Prior Function Prior Level of Function : Independent/Modified Independent                     Journalist, newspaper        Extremity/Trunk Assessment   Upper Extremity Assessment Upper Extremity Assessment: Overall WFL for tasks assessed    Lower Extremity Assessment Lower Extremity Assessment: Overall WFL for tasks assessed    Cervical / Trunk Assessment Cervical / Trunk Assessment: Normal  Communication   Communication: Deaf (pt indicated he preferred PT write out questions and he responded verbally)  Cognition Arousal/Alertness: Awake/alert Behavior During Therapy: WFL for tasks assessed/performed Overall Cognitive Status: Within Functional Limits for tasks assessed  General Comments: followed simple commands        General Comments      Exercises     Assessment/Plan    PT Assessment Patient does not need any further PT services  PT Problem List         PT Treatment Interventions      PT Goals (Current goals can be found in the Care Plan section)  Acute Rehab PT Goals PT Goal Formulation: All assessment and education complete, DC therapy    Frequency       Co-evaluation               AM-PAC PT "6 Clicks" Mobility  Outcome Measure Help needed turning from your back to your side while in a flat bed without using  bedrails?: None Help needed moving from lying on your back to sitting on the side of a flat bed without using bedrails?: None Help needed moving to and from a bed to a chair (including a wheelchair)?: None Help needed standing up from a chair using your arms (e.g., wheelchair or bedside chair)?: None Help needed to walk in hospital room?: None Help needed climbing 3-5 steps with a railing? : None 6 Click Score: 24    End of Session   Activity Tolerance: Patient tolerated treatment well Patient left: in bed;with call bell/phone within reach Nurse Communication: Mobility status;Other (comment) (independent and did not turn on bed alarm; RN ok withthis) PT Visit Diagnosis: Difficulty in walking, not elsewhere classified (R26.2)    Time: 1150-1201 PT Time Calculation (min) (ACUTE ONLY): 11 min   Charges:   PT Evaluation $PT Eval Low Complexity: [place], PT Acute Rehabilitation Services  Office [phone]   Rexanne Mano 03/29/2022, 12:12 PM

## 2022-03-29 NOTE — Progress Notes (Signed)
PROGRESS NOTE                                                                                                                                                                                                             Patient Demographics:    Brett Campbell, is a 61 y.o. male, DOB - 08/21/1961, JQB:[id number]  Outpatient Primary MD for the patient is [name], NP    LOS - 1  Admit date - 03/28/2022    Chief Complaint  Patient presents with   Shortness of Breath       Brief Narrative (HPI from H&P)    60 y.o. 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  Found to be septic upon admission.   Subjective:    Brett Campbell J today has, No headache, No chest pain, No abdominal pain - No Nausea, No new weakness tingling or numbness, +ve cough   Assessment  & Plan :    Sepsis due to left lower lobe pneumonia (HCC) - Sepsis POA, he appears to have community-acquired pneumonia, failed outpatient antibiotics, placed on appropriate IV antibiotics Rocephin and azithromycin which will be continued, follow cultures, advance activity and titrate down oxygen.  Continue hydration with IV fluids.  Sepsis pathophysiology seems to have resolved appears nontoxic now.  Essential hypertension - Hold home lisinopril and amlodipine due to hypotension on arrival.  Tinea gentle IV fluids, monitor on as needed IV hydralazine.  Mixed hyperlipidemia - Continue atorvastatin.  Acquired hypothyroidism - continue Synthroid, check TSH.    Lab Results  Component Value Date   TSH 0.12 (L) 12/26/2021        Condition - Fair  Family Communication  :  None present  Code Status :  Full  Consults  :  None  PUD Prophylaxis :    Procedures  :            Disposition Plan  :    Status is: Inpatient  DVT Prophylaxis  :    enoxaparin (LOVENOX) injection 40 mg Start: 03/28/22 2200    Lab Results  Component Value Date   PLT 250 03/28/2022    Diet :  Diet Order  Diet Heart Room service appropriate? Yes; Fluid consistency: Thin  Diet effective now                    Inpatient Medications  Scheduled Meds:  atorvastatin  10 mg Oral Daily   enoxaparin (LOVENOX) injection  40 mg Subcutaneous Q24H   guaiFENesin  600 mg Oral BID   levothyroxine  150 mcg Oral Q0600   potassium chloride  20 mEq Oral Once   Continuous Infusions:  azithromycin Stopped (03/28/22 2103)   cefTRIAXone (ROCEPHIN)  IV Stopped (03/28/22 2006)   PRN Meds:.acetaminophen **OR** acetaminophen, albuterol, ondansetron **OR** ondansetron (ZOFRAN) IV, mouth rinse, senna-docusate  Time Spent in minutes  30   Lala Lund M.D on 03/29/2022 at 8:31 AM  To page go to [email or web address]   [facility] -  Office  [phone]  See all Orders from today for further details    Objective:   Vitals:   03/28/22 2130 03/28/22 2200 03/28/22 2215 03/28/22 2313  BP: 112/61 119/62 123/61 118/65  Pulse: 85 76 71 77  Resp: 12 11 (!) 23 20  Temp:    97.7 F (36.5 C)  TempSrc:    Oral  SpO2: 96% 96% 95% 95%  Weight:    79 kg  Height:    5\' 10"  (1.778 m)    Wt Readings from Last 3 Encounters:  03/28/22 79 kg  12/26/21 83 kg  09/23/21 84.6 kg     Intake/Output Summary (Last 24 hours) at 03/29/2022 0831 Last data filed at 03/29/2022 0156 Gross per 24 hour  Intake 902.09 ml  Output --  Net 902.09 ml     Physical Exam  Awake Alert, No new F.N deficits, Normal affect [place].AT,PERRAL Supple Neck, No JVD,   Symmetrical Chest wall movement, Good air movement bilaterally, CTAB RRR,No Gallops,Rubs or new Murmurs,  +ve B.Sounds, Abd Soft, No tenderness,   No Cyanosis, Clubbing or edema     Data Review:    CBC Recent Labs  Lab  03/25/22 1445 03/28/22 1850 03/28/22 2347  WBC 17.5* 12.3* 10.6*  HGB 13.2 12.3* 11.5*  HCT 37.2* 36.3* 33.4*  PLT 292 293 250  MCV 87.5 89.2 88.1  MCH 31.1 30.2 30.3  MCHC 35.5 33.9 34.4  RDW 11.8 11.8 11.9  LYMPHSABS 1.7 1.7  --   MONOABS 1.6* 0.9  --   EOSABS 0.1 0.1  --   BASOSABS 0.1 0.1  --     Electrolytes Recent Labs  Lab 03/25/22 1445 03/28/22 1850 03/28/22 2347  NA 137 136 138  K 3.8 3.5 3.1*  CL 103 100 104  CO2 24 25 24   GLUCOSE 96 103* 106*  BUN 13 13 10   CREATININE 1.29* 1.32* 1.11  CALCIUM 7.5* 7.3* 6.8*  AST 24 21 18   ALT 24 22 18   ALKPHOS 71 56 47  BILITOT 0.7 0.5 0.7  ALBUMIN 2.8* 2.5* 2.2*  PROCALCITON  --   --  0.17  LATICACIDVEN  --  1.1 1.0  INR  --  1.1  --    Radiology Reports DG Chest Port 1 View  Result Date: 03/28/2022 CLINICAL DATA:  Sepsis EXAM: PORTABLE CHEST 1 VIEW COMPARISON:  03/25/2022 FINDINGS: Single frontal view of the chest demonstrates persistent left lower lobe airspace disease compatible with pneumonia. Small left effusion cannot be excluded. Right chest is clear. No pneumothorax. Cardiac silhouette is stable. No acute bony abnormalities. IMPRESSION: 1. Stable left lower lobe pneumonia. Followup PA and lateral chest X-ray is recommended  in 3-4 weeks following trial of antibiotic therapy to ensure resolution and exclude underlying malignancy. Electronically Signed   By: Randa Ngo M.D.   On: 03/28/2022 18:26   DG Chest 2 View  Result Date: 03/25/2022 CLINICAL DATA:  Cough and fever. EXAM: CHEST - 2 VIEW COMPARISON:  05/03/2020 FINDINGS: The cardiac silhouette, mediastinal and hilar contours are within normal limits. Dense left lower lobe airspace process consistent with pneumonia. The right lung is clear. No pleural effusions. Surgical changes likely from prior thyroidectomy. The bony structures are intact. IMPRESSION: Left lower lobe pneumonia. Followup PA and lateral chest X-ray is recommended in 3-4 weeks following trial of  antibiotic therapy to ensure resolution and exclude underlying malignancy. Electronically Signed   By: Marijo Sanes M.D.   On: 03/25/2022 11:52

## 2022-03-30 ENCOUNTER — Other Ambulatory Visit (HOSPITAL_COMMUNITY): Payer: Self-pay

## 2022-03-30 DIAGNOSIS — J189 Pneumonia, unspecified organism: Secondary | ICD-10-CM | POA: Diagnosis not present

## 2022-03-30 DIAGNOSIS — A419 Sepsis, unspecified organism: Secondary | ICD-10-CM | POA: Diagnosis not present

## 2022-03-30 LAB — CBC WITH DIFFERENTIAL/PLATELET
Abs Immature Granulocytes: 0.07 10*3/uL (ref 0.00–0.07)
Basophils Absolute: 0.1 10*3/uL (ref 0.0–0.1)
Basophils Relative: 1 %
Eosinophils Absolute: 0.2 10*3/uL (ref 0.0–0.5)
Eosinophils Relative: 2 %
HCT: 34.8 % — ABNORMAL LOW (ref 39.0–52.0)
Hemoglobin: 12.2 g/dL — ABNORMAL LOW (ref 13.0–17.0)
Immature Granulocytes: 1 %
Lymphocytes Relative: 17 %
Lymphs Abs: 1.5 10*3/uL (ref 0.7–4.0)
MCH: 30.6 pg (ref 26.0–34.0)
MCHC: 35.1 g/dL (ref 30.0–36.0)
MCV: 87.2 fL (ref 80.0–100.0)
Monocytes Absolute: 0.6 10*3/uL (ref 0.1–1.0)
Monocytes Relative: 7 %
Neutro Abs: 6.2 10*3/uL (ref 1.7–7.7)
Neutrophils Relative %: 72 %
Platelets: 275 10*3/uL (ref 150–400)
RBC: 3.99 MIL/uL — ABNORMAL LOW (ref 4.22–5.81)
RDW: 11.5 % (ref 11.5–15.5)
WBC: 8.6 10*3/uL (ref 4.0–10.5)
nRBC: 0 % (ref 0.0–0.2)

## 2022-03-30 LAB — MAGNESIUM: Magnesium: 1.7 mg/dL (ref 1.7–2.4)

## 2022-03-30 LAB — BASIC METABOLIC PANEL
Anion gap: 10 (ref 5–15)
BUN: 10 mg/dL (ref 6–20)
CO2: 24 mmol/L (ref 22–32)
Calcium: 6.8 mg/dL — ABNORMAL LOW (ref 8.9–10.3)
Chloride: 105 mmol/L (ref 98–111)
Creatinine, Ser: 1.01 mg/dL (ref 0.61–1.24)
GFR, Estimated: >60 mL/min (ref >60)
Glucose, Bld: 82 mg/dL (ref 70–99)
Potassium: 3.4 mmol/L — ABNORMAL LOW (ref 3.5–5.1)
Sodium: 139 mmol/L (ref 135–145)

## 2022-03-30 LAB — TSH: TSH: 0.658 u[IU]/mL (ref 0.350–4.500)

## 2022-03-30 LAB — BRAIN NATRIURETIC PEPTIDE: B Natriuretic Peptide: 301.3 pg/mL — ABNORMAL HIGH (ref 0.0–100.0)

## 2022-03-30 MED ORDER — POTASSIUM CHLORIDE CRYS ER 20 MEQ PO TBCR
40.0000 meq | EXTENDED_RELEASE_TABLET | Freq: Once | ORAL | Status: AC
  Administered 2022-03-30: 40 meq via ORAL
  Filled 2022-03-30: qty 2

## 2022-03-30 MED ORDER — MAGNESIUM SULFATE 2 GM/50ML IV SOLN
2.0000 g | Freq: Once | INTRAVENOUS | Status: AC
  Administered 2022-03-30: 2 g via INTRAVENOUS
  Filled 2022-03-30: qty 50

## 2022-03-30 MED ORDER — AZITHROMYCIN 500 MG PO TABS
500.0000 mg | ORAL_TABLET | Freq: Every day | ORAL | 0 refills | Status: DC
  Filled 2022-03-30: qty 5, 5d supply, fill #0

## 2022-03-30 MED ORDER — LISINOPRIL 10 MG PO TABS: 10.0000 mg | ORAL_TABLET | Freq: Every day | ORAL | 0 refills | Status: DC

## 2022-03-30 MED ORDER — SODIUM CHLORIDE 0.9 % IV SOLN
2.0000 g | INTRAVENOUS | Status: DC
  Administered 2022-03-30: 2 g via INTRAVENOUS
  Filled 2022-03-30: qty 20

## 2022-03-30 MED ORDER — CEPHALEXIN 500 MG PO CAPS
500.0000 mg | ORAL_CAPSULE | Freq: Three times a day (TID) | ORAL | 0 refills | Status: AC
  Filled 2022-03-30: qty 15, 5d supply, fill #0

## 2022-03-30 MED ORDER — AZITHROMYCIN 500 MG PO TABS
500.0000 mg | ORAL_TABLET | Freq: Every day | ORAL | Status: DC
  Administered 2022-03-30: 500 mg via ORAL
  Filled 2022-03-30: qty 1

## 2022-03-30 NOTE — Discharge Instructions (Signed)
Follow with Primary MD [name], NP in 7 days   Get CBC, CMP, Magnesium, 2 view Chest X ray -  checked next visit within 1 week by Primary MD   Activity: As tolerated with Full fall precautions use walker/cane & assistance as needed  Disposition Home     Diet: Heart Healthy    Special Instructions: If you have smoked or chewed Tobacco  in the last 2 yrs please stop smoking, stop any regular Alcohol  and or any Recreational drug use.  On your next visit with your primary care physician please Get Medicines reviewed and adjusted.  Please request your Prim.MD to go over all Hospital Tests and Procedure/Radiological results at the follow up, please get all Hospital records sent to your Prim MD by signing hospital release before you go home.  If you experience worsening of your admission symptoms, develop shortness of breath, life threatening emergency, suicidal or homicidal thoughts you must seek medical attention immediately by calling 911 or calling your MD immediately  if symptoms less severe.  You Must read complete instructions/literature along with all the possible adverse reactions/side effects for all the Medicines you take and that have been prescribed to you. Take any new Medicines after you have completely understood and accpet all the possible adverse reactions/side effects.

## 2022-03-30 NOTE — Discharge Summary (Signed)
Brett Campbell UEK:[id number] DOB: 1961-02-27 DOA: 03/28/2022  PCP: Brett Freshwater, NP  Admit date: 03/28/2022  Discharge date: 03/30/2022  Admitted From: Home   Disposition:  Home   Recommendations for Outpatient Follow-up:   Follow up with PCP in 1-2 weeks  PCP Please obtain BMP/CBC, 2 view CXR in 1week,  (see Discharge instructions)   PCP Please follow up on the following pending results:    Home Health: None   Equipment/Devices: None  Consultations: None  Discharge Condition: Stable    CODE STATUS: Full    Diet Recommendation: Heart Healthy     Chief Complaint  Patient presents with   Shortness of Breath     Brief history of present illness from the day of admission and additional interim summary     61 y.o. 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                                                                 Hospital Course   Sepsis due to left lower lobe pneumonia (HCC) - Sepsis POA, he appears to have community-acquired pneumonia, failed outpatient antibiotics, placed on appropriate IV antibiotics Rocephin and azithromycin along with IV fluids, remarkably better, stable on room air and symptom-free, sepsis pathophysiology has completely resolved, cultures remain negative, clinically stable will be placed on 5 more days of oral Keflex along with azithromycin and discharged home with outpatient PCP follow-up   Essential hypertension - resume home medications on a gradual fashion as below.    EMEA and borderline magnesium levels.  Both replaced prior to discharge.  PCP to monitor.    Mixed hyperlipidemia - Continue atorvastatin.   Acquired hypothyroidism - continue Synthroid     Lab Results  Component Value Date   TSH 0.658 03/30/2022     Discharge diagnosis     Principal Problem:   Sepsis due to left lower lobe pneumonia ([place]) Active Problems:   Essential hypertension   Acquired hypothyroidism   Mixed hyperlipidemia    Discharge instructions    Discharge Instructions     Diet - low sodium heart healthy   Complete by: As directed    Discharge instructions   Complete by: As directed    Follow with Primary MD Brett Freshwater, NP in 7 days   Get CBC, CMP, Magnesium, 2 view Chest X ray -  checked next visit within 1 week by Primary MD   Activity: As tolerated with Full fall precautions use walker/cane & assistance as  needed  Disposition Home     Diet: Heart Healthy    Special Instructions: If you have smoked or chewed Tobacco  in the last 2 yrs please stop smoking, stop any regular Alcohol  and or any Recreational drug use.  On your next visit with your primary care physician please Get Medicines reviewed and adjusted.  Please request your Prim.MD to go over all Hospital Tests and Procedure/Radiological results at the follow up, please get all Hospital records sent to your Prim MD by signing hospital release before you go home.  If you experience worsening of your admission symptoms, develop shortness of breath, life threatening emergency, suicidal or homicidal thoughts you must seek medical attention immediately by calling 911 or calling your MD immediately  if symptoms less severe.  You Must read complete instructions/literature along with all the possible adverse reactions/side effects for all the Medicines you take and that have been prescribed to you. Take any new Medicines after you have completely understood and accpet all the possible adverse  reactions/side effects.   Increase activity slowly   Complete by: As directed        Discharge Medications   Allergies as of 03/30/2022       Reactions   Penicillins    Unknown Did it involve swelling of the face/tongue/throat, SOB, or low BP? Unknown Did it involve sudden or severe rash/hives, skin peeling, or any reaction on the inside of your mouth or nose? Unknown Did you need to seek medical attention at a hospital or doctor's office? Unknown When did it last happen?     childhood  If all above answers are "NO", may proceed with cephalosporin use.        Medication List     STOP taking these medications    levofloxacin 750 MG tablet Commonly known as: LEVAQUIN       TAKE these medications    amLODipine 5 MG tablet Commonly known as: NORVASC Take 1 tablet (5 mg total) by mouth daily.   atorvastatin 10 MG tablet Commonly known as: LIPITOR Take 1 tablet (10 mg total) by mouth daily.   azithromycin 500 MG tablet Commonly known as: ZITHROMAX Take 1 tablet (500 mg total) by mouth daily.   calcitRIOL 0.25 MCG capsule Commonly known as: ROCALTROL Take 1 capsule (0.25 mcg total) by mouth daily. What changed: how much to take   Caltrate 600+D Plus Minerals 600-800 MG-UNIT Chew Chew 1,200 mg by mouth 2 (two) times daily. What changed: how much to take   cephALEXin 500 MG capsule Commonly known as: KEFLEX Take 1 capsule (500 mg total) by mouth 3 (three) times daily for 5 days.   ibuprofen 200 MG tablet Commonly known as: ADVIL Take 400-800 mg by mouth every 6 (six) hours as needed for moderate pain.   levothyroxine 150 MCG tablet Commonly known as: SYNTHROID Take 1 tablet (150 mcg total) by mouth daily.   lisinopril 10 MG tablet Commonly known as: ZESTRIL Take 1 tablet (10 mg total) by mouth daily. Start taking on: April 01, 2022 What changed: These instructions start on April 01, 2022. If you are unsure what to do until then, ask your doctor or other  care provider.   potassium chloride SA 20 MEQ tablet Commonly known as: KLOR-CON M Take 1 tablet (20 mEq total) by mouth daily.         Follow-up Information     Brett Freshwater, NP. Schedule an appointment as soon as possible for a  visit in 1 week(s).   Specialty: Family Medicine Contact information: [place] [address] [phone]                 Major procedures and Radiology Reports - PLEASE review detailed and final reports thoroughly  -       DG Chest Port 1 View  Result Date: 03/28/2022 CLINICAL DATA:  Sepsis EXAM: PORTABLE CHEST 1 VIEW COMPARISON:  03/25/2022 FINDINGS: Single frontal view of the chest demonstrates persistent left lower lobe airspace disease compatible with pneumonia. Small left effusion cannot be excluded. Right chest is clear. No pneumothorax. Cardiac silhouette is stable. No acute bony abnormalities. IMPRESSION: 1. Stable left lower lobe pneumonia. Followup PA and lateral chest X-ray is recommended in 3-4 weeks following trial of antibiotic therapy to ensure resolution and exclude underlying malignancy. Electronically Signed   By: Randa Ngo M.D.   On: 03/28/2022 18:26   DG Chest 2 View  Result Date: 03/25/2022 CLINICAL DATA:  Cough and fever. EXAM: CHEST - 2 VIEW COMPARISON:  05/03/2020 FINDINGS: The cardiac silhouette, mediastinal and hilar contours are within normal limits. Dense left lower lobe airspace process consistent with pneumonia. The right lung is clear. No pleural effusions. Surgical changes likely from prior thyroidectomy. The bony structures are intact. IMPRESSION: Left lower lobe pneumonia. Followup PA and lateral chest X-ray is recommended in 3-4 weeks following trial of antibiotic therapy to ensure resolution and exclude underlying malignancy. Electronically Signed   By: Marijo Sanes M.D.   On: 03/25/2022 11:52    Today   Subjective    Khaleel Harshman J today has no headache,no chest abdominal  pain,no new weakness tingling or numbness, feels much better wants to go home today.     Objective   Blood pressure 120/67, pulse 62, temperature 97.8 F (36.6 C), temperature source Oral, resp. rate 16, height 5\' 10"  (1.778 m), weight 79 kg, SpO2 97 %.   Intake/Output Summary (Last 24 hours) at 03/30/2022 0906 Last data filed at 03/30/2022 0600 Gross per 24 hour  Intake 1189.55 ml  Output --  Net 1189.55 ml    Exam  Awake Alert, No new F.N deficits,    Egan.AT,PERRAL Supple Neck,   Symmetrical Chest wall movement, Good air movement bilaterally, CTAB RRR,No Gallops,   +ve B.Sounds, Abd Soft, Non tender,  No Cyanosis, Clubbing or edema    Data Review   Recent Labs  Lab 03/25/22 1445 03/28/22 1850 03/28/22 2347 03/30/22 0633  WBC 17.5* 12.3* 10.6* 8.6  HGB 13.2 12.3* 11.5* 12.2*  HCT 37.2* 36.3* 33.4* 34.8*  PLT 292 293 250 275  MCV 87.5 89.2 88.1 87.2  MCH 31.1 30.2 30.3 30.6  MCHC 35.5 33.9 34.4 35.1  RDW 11.8 11.8 11.9 11.5  LYMPHSABS 1.7 1.7  --  1.5  MONOABS 1.6* 0.9  --  0.6  EOSABS 0.1 0.1  --  0.2  BASOSABS 0.1 0.1  --  0.1    Recent Labs  Lab 03/25/22 1445 03/28/22 1850 03/28/22 2347 03/30/22 0633  NA 137 136 138 139  K 3.8 3.5 3.1* 3.4*  CL 103 100 104 105  CO2 24 25 24 24   GLUCOSE 96 103* 106* 82  BUN 13 13 10 10   CREATININE 1.29* 1.32* 1.11 1.01  CALCIUM 7.5* 7.3* 6.8* 6.8*  AST 24 21 18   --   ALT 24 22 18   --   ALKPHOS 71 56 47  --   BILITOT 0.7 0.5 0.7  --  ALBUMIN 2.8* 2.5* 2.2*  --   MG  --   --   --  1.7  PROCALCITON  --   --  0.17  --   LATICACIDVEN  --  1.1 1.0  --   INR  --  1.1  --   --   TSH  --   --   --  0.658  BNP  --   --   --  301.3*    Total Time in preparing paper work, data evaluation and todays exam - 35 minutes  Lala Lund M.D on 03/30/2022 at 9:06 AM  [facility]

## 2022-03-30 NOTE — Progress Notes (Signed)
CSW spoke with patient's sister [name] who states the patient's niece Apolonio Schneiders is coming to pick him up for transportation home.  Madilyn Fireman, MSW, LCSW Transitions of Care  Clinical Social Worker II [phone]

## 2022-03-31 ENCOUNTER — Ambulatory Visit: Payer: Self-pay

## 2022-03-31 NOTE — Progress Notes (Signed)
Negative blood clots

## 2022-03-31 NOTE — Patient Outreach (Signed)
  Care Coordination [facility] Note Transition Care Management Follow-up Telephone Call Date of discharge and from where: [facility] 03/28/22-03/30/22 How have you been since you were released from the hospital? Per [name], patients sister and POA, patient is doing a lot better.  He has been eating and drinking well and even went out to get the mail yesterday.   Any questions or concerns? No  Items Reviewed: Did the pt receive and understand the discharge instructions provided? Yes  Medications obtained and verified? Yes  Other? No  Any new allergies since your discharge? No  Dietary orders reviewed? Yes Do you have support at home? Yes   Home Care and Equipment/Supplies: Were home health services ordered? no If so, what is the name of the agency? N/A  Has the agency set up a time to come to the patient's home? not applicable Were any new equipment or medical supplies ordered?  No What is the name of the medical supply agency? N/A Were you able to get the supplies/equipment? not applicable Do you have any questions related to the use of the equipment or supplies? No  Functional Questionnaire: (I = Independent and D = Dependent) ADLs: I  Bathing/Dressing- I  Meal Prep- I  Eating- I  Maintaining continence- I  Transferring/Ambulation- I  Managing Meds- I  Follow up appointments reviewed:  PCP Hospital f/u appt confirmed? Yes  Scheduled to see Leretha Pol, NP on 04/07/22 @ 2:50. [facility] f/u appt confirmed?  N/A   Are transportation arrangements needed? No  If their condition worsens, is the pt aware to call PCP or go to the Emergency Dept.? Yes Was the patient provided with contact information for the PCP's office or ED? Yes Was to pt encouraged to call back with questions or concerns? Yes  SDOH assessments and interventions completed:   Yes  Care Coordination Interventions Activated:  No   Care Coordination Interventions:   Not needed at this time.     Encounter  Outcome:  Pt. Visit Completed

## 2022-04-01 ENCOUNTER — Ambulatory Visit: Payer: Medicare HMO | Admitting: Nurse Practitioner

## 2022-04-02 LAB — CULTURE, BLOOD (ROUTINE X 2)
Culture: NO GROWTH
Culture: NO GROWTH
Special Requests: ADEQUATE
Special Requests: ADEQUATE

## 2022-04-06 NOTE — Progress Notes (Unsigned)
Established patient visit   Patient: Brett Campbell   DOB: [birth year]   61 y.o. Male  MRN: [id number] Visit Date: 04/07/2022   No chief complaint on file.  Subjective    HPI  Patient hospitalized from 03/28/2022 through 03/30/2022 due to sepsis from community acquired pneumonia.  -hospital H & P states that he was started on oral levaquin, but this was started in urgent care.  -required IV antibiotics - rocephin and zithromax.  --need to get BMP, CBC, and repeat chest x-ray  --bmp on 03/30/2022 showed low potassium and decreased calcium level. He had normal renal functions.  --cbc 03/30/2022 showed mild anemia which was improving.  --chest x-ray showed left lower lobe pneumonia     Medications: Outpatient Medications Prior to Visit  Medication Sig   amLODipine (NORVASC) 5 MG tablet Take 1 tablet (5 mg total) by mouth daily.   atorvastatin (LIPITOR) 10 MG tablet Take 1 tablet (10 mg total) by mouth daily.   azithromycin (ZITHROMAX) 500 MG tablet Take 1 tablet (500 mg total) by mouth daily.   calcitRIOL (ROCALTROL) 0.25 MCG capsule Take 1 capsule (0.25 mcg total) by mouth daily. (Patient taking differently: Take 0.5 mcg by mouth daily.)   Calcium Carbonate-Vit D-Min (CALTRATE 600+D PLUS MINERALS) 600-800 MG-UNIT CHEW Chew 1,200 mg by mouth 2 (two) times daily. (Patient taking differently: Chew 2 tablets by mouth 2 (two) times daily.)   ibuprofen (ADVIL) 200 MG tablet Take 400-800 mg by mouth every 6 (six) hours as needed for moderate pain.   levothyroxine (SYNTHROID) 150 MCG tablet Take 1 tablet (150 mcg total) by mouth daily.   lisinopril (ZESTRIL) 10 MG tablet Take 1 tablet (10 mg total) by mouth daily.   potassium chloride SA (KLOR-CON M) 20 MEQ tablet Take 1 tablet (20 mEq total) by mouth daily.   No facility-administered medications prior to visit.    Review of Systems  {Labs (Optional):23779}   Objective    There were no vitals taken for this visit. BP Readings from Last 3  Encounters:  03/30/22 120/67  03/28/22 (!) 89/52  03/25/22 (!) 105/53    Wt Readings from Last 3 Encounters:  03/28/22 174 lb 2.6 oz (79 kg)  12/26/21 183 lb (83 kg)  09/23/21 186 lb 9.6 oz (84.6 kg)    Physical Exam  ***  No results found for any visits on 04/07/22.  Assessment & Plan     Problem List Items Addressed This Visit   None    No follow-ups on file.         [name], NP  [facility] at [facility] [phone] (phone) [phone] (fax)  [place]

## 2022-04-07 ENCOUNTER — Encounter: Payer: Self-pay | Admitting: Nurse Practitioner

## 2022-04-07 ENCOUNTER — Ambulatory Visit (INDEPENDENT_AMBULATORY_CARE_PROVIDER_SITE_OTHER): Payer: Medicare HMO | Admitting: Nurse Practitioner

## 2022-04-07 VITALS — BP 103/63 | HR 63 | Ht 70.08 in | Wt 173.1 lb

## 2022-04-07 DIAGNOSIS — J189 Pneumonia, unspecified organism: Secondary | ICD-10-CM

## 2022-04-07 DIAGNOSIS — A419 Sepsis, unspecified organism: Secondary | ICD-10-CM | POA: Diagnosis not present

## 2022-04-07 DIAGNOSIS — H903 Sensorineural hearing loss, bilateral: Secondary | ICD-10-CM | POA: Diagnosis not present

## 2022-04-07 DIAGNOSIS — Z09 Encounter for follow-up examination after completed treatment for conditions other than malignant neoplasm: Secondary | ICD-10-CM | POA: Diagnosis not present

## 2022-04-07 DIAGNOSIS — E876 Hypokalemia: Secondary | ICD-10-CM | POA: Diagnosis not present

## 2022-04-10 ENCOUNTER — Other Ambulatory Visit: Payer: Medicare HMO

## 2022-04-10 DIAGNOSIS — A419 Sepsis, unspecified organism: Secondary | ICD-10-CM | POA: Diagnosis not present

## 2022-04-10 DIAGNOSIS — J189 Pneumonia, unspecified organism: Secondary | ICD-10-CM | POA: Diagnosis not present

## 2022-04-10 DIAGNOSIS — E876 Hypokalemia: Secondary | ICD-10-CM | POA: Diagnosis not present

## 2022-04-11 LAB — CBC
Hematocrit: 44 % (ref 37.5–51.0)
Hemoglobin: 14.6 g/dL (ref 13.0–17.7)
MCH: 30.1 pg (ref 26.6–33.0)
MCHC: 33.2 g/dL (ref 31.5–35.7)
MCV: 91 fL (ref 79–97)
Platelets: 354 10*3/uL (ref 150–450)
RBC: 4.85 x10E6/uL (ref 4.14–5.80)
RDW: 12 % (ref 11.6–15.4)
WBC: 10.7 10*3/uL (ref 3.4–10.8)

## 2022-04-11 LAB — BASIC METABOLIC PANEL
BUN/Creatinine Ratio: 12 (ref 10–24)
BUN: 21 mg/dL (ref 8–27)
CO2: 24 mmol/L (ref 20–29)
Calcium: 9.9 mg/dL (ref 8.6–10.2)
Chloride: 97 mmol/L (ref 96–106)
Creatinine, Ser: 1.71 mg/dL — ABNORMAL HIGH (ref 0.76–1.27)
Glucose: 80 mg/dL (ref 70–99)
Potassium: 5 mmol/L (ref 3.5–5.2)
Sodium: 139 mmol/L (ref 134–144)
eGFR: 45 mL/min/{1.73_m2} — ABNORMAL LOW (ref >59)

## 2022-04-13 NOTE — Progress Notes (Signed)
Please let the patient know that his labs are back. His renal functions are a bit elevated from when he was in the hospital, though his potassium and calcium levels are back to normal. His blood count also looks good. I advise that he REALLY increase his water intake over the next two weeks. We should recheck a BMP again in 2 weeks.  Thanks  -HB  When they make a lab appointment, I can add in the BMP. Thanks.

## 2022-04-14 ENCOUNTER — Ambulatory Visit
Admission: RE | Admit: 2022-04-14 | Discharge: 2022-04-14 | Disposition: A | Discharge status: Home / Self Care | Payer: Medicare HMO | Source: Ambulatory Visit | Attending: Nurse Practitioner | Admitting: Nurse Practitioner

## 2022-04-14 DIAGNOSIS — R059 Cough, unspecified: Secondary | ICD-10-CM | POA: Diagnosis not present

## 2022-04-14 DIAGNOSIS — J189 Pneumonia, unspecified organism: Secondary | ICD-10-CM

## 2022-04-28 ENCOUNTER — Other Ambulatory Visit: Payer: Self-pay

## 2022-04-28 ENCOUNTER — Other Ambulatory Visit: Payer: Medicare HMO

## 2022-04-28 DIAGNOSIS — N289 Disorder of kidney and ureter, unspecified: Secondary | ICD-10-CM | POA: Diagnosis not present

## 2022-04-29 ENCOUNTER — Other Ambulatory Visit: Payer: Self-pay | Admitting: Nurse Practitioner

## 2022-04-29 DIAGNOSIS — J189 Pneumonia, unspecified organism: Secondary | ICD-10-CM

## 2022-04-29 LAB — BASIC METABOLIC PANEL
BUN/Creatinine Ratio: 12 (ref 10–24)
BUN: 14 mg/dL (ref 8–27)
CO2: 19 mmol/L — ABNORMAL LOW (ref 20–29)
Calcium: 8.9 mg/dL (ref 8.6–10.2)
Chloride: 103 mmol/L (ref 96–106)
Creatinine, Ser: 1.17 mg/dL (ref 0.76–1.27)
Glucose: 91 mg/dL (ref 70–99)
Potassium: 4.7 mmol/L (ref 3.5–5.2)
Sodium: 139 mmol/L (ref 134–144)
eGFR: 71 mL/min/{1.73_m2} (ref >59)

## 2022-04-29 MED ORDER — CLARITHROMYCIN 500 MG PO TABS: 500.0000 mg | ORAL_TABLET | Freq: Two times a day (BID) | ORAL | 0 refills | Status: DC

## 2022-04-29 NOTE — Progress Notes (Signed)
Chest x-ray showing mild imrovement of pneumonia in left lower lung. Will have him take biaxin 500 mg twice daily for 7 days.  He will need to repeat his chest x-ray again in 2 to 3 weeks.

## 2022-04-29 NOTE — Progress Notes (Signed)
Sorry. This goes with the prior message. Please let him know that his chest x-ray is showing mild imrovement of pneumonia in left lower lung. Will have him take biaxin 500 mg twice daily for 7 days.  He will need to repeat his chest x-ray again in 2 to 3 weeks.

## 2022-04-29 NOTE — Progress Notes (Signed)
The medication was sent to [facility] on [address] and the order for chest x-ray has already been placed.  Thanks  -HB

## 2022-04-29 NOTE — Progress Notes (Signed)
Please let the patient know that recheck of kidney functions was normal.  Thanks  -HB

## 2022-05-14 ENCOUNTER — Ambulatory Visit
Admission: RE | Admit: 2022-05-14 | Discharge: 2022-05-14 | Disposition: A | Discharge status: Home / Self Care | Payer: Medicare HMO | Source: Ambulatory Visit | Attending: Nurse Practitioner | Admitting: Nurse Practitioner

## 2022-05-14 DIAGNOSIS — Z8701 Personal history of pneumonia (recurrent): Secondary | ICD-10-CM | POA: Diagnosis not present

## 2022-05-14 DIAGNOSIS — R918 Other nonspecific abnormal finding of lung field: Secondary | ICD-10-CM | POA: Diagnosis not present

## 2022-05-14 DIAGNOSIS — J189 Pneumonia, unspecified organism: Secondary | ICD-10-CM

## 2022-05-17 ENCOUNTER — Other Ambulatory Visit: Payer: Self-pay | Admitting: Nurse Practitioner

## 2022-05-17 DIAGNOSIS — J189 Pneumonia, unspecified organism: Secondary | ICD-10-CM

## 2022-05-17 MED ORDER — LEVOFLOXACIN 250 MG PO TABS: 250.0000 mg | ORAL_TABLET | Freq: Every day | ORAL | 0 refills | Status: DC

## 2022-05-17 NOTE — Progress Notes (Signed)
Recent chest x-ray is showing improving pneumonia, but not resolved. Will do levofloxacin 250 mg daily for 7 days. Repeat chest x-ray again in 2 to 3 weeks.  Antibiotic sent to [facility] drive. Chest x-ray ordered at [facility] imaging.

## 2022-05-17 NOTE — Progress Notes (Signed)
Please let the  patient/sister know that recent chest x-ray is showing improving pneumonia, but not resolved. Will do levofloxacin 250 mg daily for 7 days. Repeat chest x-ray again in 2 to 3 weeks.  Antibiotic sent to [facility] drive. Chest x-ray ordered at [facility] imaging.  Thanks  -HB

## 2022-05-19 ENCOUNTER — Ambulatory Visit (INDEPENDENT_AMBULATORY_CARE_PROVIDER_SITE_OTHER): Payer: Medicare HMO | Admitting: Nurse Practitioner

## 2022-05-19 VITALS — BP 106/64 | HR 64 | Ht 70.8 in | Wt 176.8 lb

## 2022-05-19 DIAGNOSIS — Z23 Encounter for immunization: Secondary | ICD-10-CM

## 2022-05-19 NOTE — Progress Notes (Signed)
Pt is here for his first shingles vaccination Pt tolerated the in well

## 2022-06-01 NOTE — Progress Notes (Signed)
Patient left before being seen by provider

## 2022-06-03 ENCOUNTER — Ambulatory Visit
Admission: RE | Admit: 2022-06-03 | Discharge: 2022-06-03 | Disposition: A | Discharge status: Home / Self Care | Payer: Medicare HMO | Source: Ambulatory Visit | Attending: Nurse Practitioner | Admitting: Nurse Practitioner

## 2022-06-03 ENCOUNTER — Telehealth: Payer: Self-pay

## 2022-06-03 DIAGNOSIS — M47814 Spondylosis without myelopathy or radiculopathy, thoracic region: Secondary | ICD-10-CM | POA: Diagnosis not present

## 2022-06-03 DIAGNOSIS — J189 Pneumonia, unspecified organism: Secondary | ICD-10-CM

## 2022-06-03 DIAGNOSIS — R918 Other nonspecific abnormal finding of lung field: Secondary | ICD-10-CM | POA: Diagnosis not present

## 2022-06-03 DIAGNOSIS — J181 Lobar pneumonia, unspecified organism: Secondary | ICD-10-CM | POA: Diagnosis not present

## 2022-06-03 NOTE — Telephone Encounter (Signed)
[facility] call about chest xray for patient results and recommend patient needs further evaluation for CT chest with contrast. Please Advise.

## 2022-06-04 ENCOUNTER — Other Ambulatory Visit: Payer: Self-pay | Admitting: Nurse Practitioner

## 2022-06-04 DIAGNOSIS — R911 Solitary pulmonary nodule: Secondary | ICD-10-CM

## 2022-06-04 DIAGNOSIS — J189 Pneumonia, unspecified organism: Secondary | ICD-10-CM

## 2022-06-04 NOTE — Telephone Encounter (Signed)
Called pt spoke to sister she is advised of his chest x-ray and stated she will have the patient go to [facility]

## 2022-06-04 NOTE — Progress Notes (Signed)
Recent chest x-ray shows persistent left lowerr lobe consolidation. Due to persistence, Chest CT with contrast has been recommended and ordered through [facility] on [address]. Patient should be contacted in near future to schedule this appointment.

## 2022-06-04 NOTE — Progress Notes (Signed)
Chest CT has been ordered due to persistent opacity in left lower lung. Patient to  be notified.

## 2022-06-19 ENCOUNTER — Ambulatory Visit
Admission: RE | Admit: 2022-06-19 | Discharge: 2022-06-19 | Disposition: A | Discharge status: Home / Self Care | Payer: Medicare HMO | Source: Ambulatory Visit | Attending: Nurse Practitioner | Admitting: Nurse Practitioner

## 2022-06-19 DIAGNOSIS — I700 Atherosclerosis of aorta: Secondary | ICD-10-CM | POA: Diagnosis not present

## 2022-06-19 DIAGNOSIS — J439 Emphysema, unspecified: Secondary | ICD-10-CM | POA: Diagnosis not present

## 2022-06-19 DIAGNOSIS — R911 Solitary pulmonary nodule: Secondary | ICD-10-CM

## 2022-06-19 DIAGNOSIS — J189 Pneumonia, unspecified organism: Secondary | ICD-10-CM

## 2022-06-19 DIAGNOSIS — J181 Lobar pneumonia, unspecified organism: Secondary | ICD-10-CM | POA: Diagnosis not present

## 2022-06-19 DIAGNOSIS — J9809 Other diseases of bronchus, not elsewhere classified: Secondary | ICD-10-CM | POA: Diagnosis not present

## 2022-06-19 MED ORDER — IOPAMIDOL (ISOVUE-300) INJECTION 61%
75.0000 mL | Freq: Once | INTRAVENOUS | Status: AC | PRN
Start: 2022-06-19 — End: 2022-06-19
  Administered 2022-06-19: 75 mL via INTRAVENOUS

## 2022-06-25 ENCOUNTER — Encounter: Payer: Self-pay | Admitting: Gastroenterology

## 2022-06-25 ENCOUNTER — Other Ambulatory Visit: Payer: Medicare HMO

## 2022-06-25 ENCOUNTER — Ambulatory Visit (INDEPENDENT_AMBULATORY_CARE_PROVIDER_SITE_OTHER): Payer: Medicare HMO | Admitting: Gastroenterology

## 2022-06-25 VITALS — BP 124/68 | HR 77 | Ht 70.0 in | Wt 178.6 lb

## 2022-06-25 DIAGNOSIS — R918 Other nonspecific abnormal finding of lung field: Secondary | ICD-10-CM | POA: Diagnosis not present

## 2022-06-25 DIAGNOSIS — R197 Diarrhea, unspecified: Secondary | ICD-10-CM

## 2022-06-25 DIAGNOSIS — Z86010 Personal history of colonic polyps: Secondary | ICD-10-CM

## 2022-06-25 MED ORDER — COLESTIPOL HCL 1 G PO TABS: 1.0000 g | ORAL_TABLET | Freq: Two times a day (BID) | ORAL | 5 refills | Status: DC

## 2022-06-25 NOTE — Patient Instructions (Addendum)
I have referred you to pulmonology given your recent CT scan.  Please stop in the lab for some stool studies.  Will start colestipol 1 g every morning. If after one week you are still having diarrhea, you can add an evening dose to the morning dose.  We will plan a colonoscopy as soon as the pulmonologist give clearance.

## 2022-06-25 NOTE — Progress Notes (Signed)
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 Including 5.5cm cecal polyp. Last colonoscopy 2022. Due surveillance now. 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   Diarrhea. Likely postoperative changes given symptoms onset immediately after hemicolectomy.  Stools studies to exclude concurrent causes. Trial of bile acid binders recommended.     PLAN: - Fecal calprotectin, GI pathogen panel - Consider daily stool bulking agent such as Metamucil or Benefiber - Colestipol 1g QAM, increased to BID after one week - Urgent Referral to Pulmonology re: lung mass - Surveillance colonoscopy after pulmonary clearance - Obtain records from genetic counseling labs 01/2021   HPI: Nikolos Billig is a 61 y.o. male who returns in follow-up.  He was last seen in 2022.  He has deafness, hypertension, hyperlipidemia, and hypothyroidism. He had 14 tubular adenomas on colonoscopy 2022.   He had a laparoscopic right hemicolectomy 02/26/2021 for a nearly 5 cm polypoid cecal mass.  Pathology returned with a 5.5 cm tubular adenoma.  0 14 lymph nodes were positive.  There was no evident dysplasia or malignancy.  He has had constant diarrhea following his procedure. He is having watery, explosive bowel movements at least 2-3 per day with some accidents. Some nocturnal waking for stooling. Prior to surgery he had a bowel movement daily. No blood or mucous in the stool. No associated abdominal pain. He will use kaopectate and Imodium as needed.  He has recently had a persistent left lower lobe consolidation despite multiple rounds of antibiotics for pneumonia.  He has a chronic cough and continues to smoke marijuana. 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  Endoscopic history: -Colonoscopy around 2015 or 44 in [place] that was "okay - Colonoscopy 10/21/2020 showed a large carpeted ulcerated polyp in the cecum, a 15 mm polyp in the descending colon, and 12 smaller tubular adenomas throughout the colon.  Seen by genetic counseling. Negative hereditary cancer genetic testing: no pathogenic variants detected in [facility] Panel.  The report date is February 13, 2021.   Labs 03/30/21: TSH normal Labs 04/10/22: normal CBC Labs 04/28/22: normal BMP  There is no known family history of colon cancer or polyps. No family history of stomach cancer or other GI malignancy. No family history of inflammatory bowel disease or celiac.    Past Medical History:  Diagnosis Date   Calcium deficiency    Cataract    COPD (chronic obstructive pulmonary disease) ([place])    Deaf    Hypertension    Hypothyroidism    Personal history of colonic polyps 11/03/2020   Scarlet fever    Sleep apnea    no longer uses CPAP   Thyroid disease    Tuberculosis    [place] department is contact with him due to hx.    Past Surgical History:  Procedure Laterality Date   CHOLECYSTECTOMY     COLONOSCOPY     +5years   LAPAROSCOPIC RIGHT HEMI COLECTOMY N/A 02/26/2021   Procedure: LAPAROSCOPIC RIGHT HEMI COLECTOMY;  Surgeon: Ileana Roup, MD;  Location: [facility];  Service: General;  Laterality: N/A;   THYROIDECTOMY  Current Outpatient Medications  Medication Sig Dispense Refill   amLODipine (NORVASC) 5 MG tablet Take 1 tablet (5 mg total) by mouth daily. 90 tablet 3   atorvastatin (LIPITOR) 10 MG tablet Take 1 tablet (10 mg total) by mouth daily. 90 tablet 3   calcitRIOL (ROCALTROL) 0.25 MCG capsule Take 1 capsule (0.25 mcg total) by mouth daily. (Patient taking differently: Take 0.5 mcg by mouth daily.) 90 capsule 3    Calcium Carbonate-Vit D-Min (CALTRATE 600+D PLUS MINERALS) 600-800 MG-UNIT CHEW Chew 1,200 mg by mouth 2 (two) times daily. (Patient taking differently: Chew 2 tablets by mouth 2 (two) times daily.) 360 tablet 3   ibuprofen (ADVIL) 200 MG tablet Take 400-800 mg by mouth every 6 (six) hours as needed for moderate pain.     levothyroxine (SYNTHROID) 150 MCG tablet Take 1 tablet (150 mcg total) by mouth daily. 90 tablet 3   lisinopril (ZESTRIL) 10 MG tablet Take 1 tablet (10 mg total) by mouth daily. 90 tablet 0   potassium chloride SA (KLOR-CON M) 20 MEQ tablet Take 1 tablet (20 mEq total) by mouth daily. 90 tablet 3   clarithromycin (BIAXIN) 500 MG tablet Take 1 tablet (500 mg total) by mouth 2 (two) times daily. (Patient not taking: Reported on 06/25/2022) 14 tablet 0   levofloxacin (LEVAQUIN) 250 MG tablet Take 1 tablet (250 mg total) by mouth daily. (Patient not taking: Reported on 06/25/2022) 7 tablet 0   No current facility-administered medications for this visit.    Allergies as of 06/25/2022 - Review Complete 06/25/2022  Allergen Reaction Noted   Penicillins  10/08/2020    Family History  Problem Relation Age of Onset   CAD Mother    Diabetes Mellitus II Mother    Diabetes Mother    CAD Father    Heart attack Father    Heart disease Father    Diabetes Sister    Colon cancer Neg Hx    Esophageal cancer Neg Hx    Stomach cancer Neg Hx    Colon polyps Neg Hx     Social History   Socioeconomic History   Marital status: Single    Spouse name: Not on file   Number of children: Not on file   Years of education: Not on file   Highest education level: Not on file  Occupational History   Not on file  Tobacco Use   Smoking status: Some Days    Packs/day: 0.25    Years: 45.00    Total pack years: 11.25    Types: Cigarettes   Smokeless tobacco: Never   Tobacco comments:    pt states he smokes 1 cigarette per day  Vaping Use   Vaping Use: Never used  Substance and Sexual  Activity   Alcohol use: Yes    Alcohol/week: 1.0 - 2.0 standard drink of alcohol    Types: 1 - 2 Cans of beer per week    Comment: occas   Drug use: Yes    Types: Marijuana   Sexual activity: Not Currently  Other Topics Concern   Not on file  Social History Narrative   Not on file   Social Determinants of Health   Financial Resource Strain: Not on file  Food Insecurity: Not on file  Transportation Needs: No Transportation Needs (03/31/2022)   PRAPARE - Hydrologist (Medical): No    Lack of Transportation (Non-Medical): No  Physical Activity: Not on file  Stress: Not on file  Social Connections: Not on file  Intimate Partner Violence: Not on file    Review of Systems: 12 system ROS is negative except as noted above.   Physical Exam: General:   Alert,  well-nourished, pleasant and cooperative in NAD Head:  Normocephalic and atraumatic. Eyes:  Sclera clear, no icterus.   Conjunctiva pink. Ears:  Normal auditory acuity. Nose:  No deformity, discharge,  or lesions. Mouth:  No deformity or lesions.   Neck:  Supple; no masses or thyromegaly. Lungs:  Clear throughout to auscultation.   No wheezes. Heart:  Regular rate and rhythm; no murmurs. Abdomen:  Soft, nontender, nondistended, normal bowel sounds, no rebound or guarding. No hepatosplenomegaly.   Rectal:  Deferred  Msk:  Symmetrical. No boney deformities LAD: No inguinal or umbilical LAD Extremities:  No clubbing or edema. Neurologic:  Alert and  oriented x4;  grossly nonfocal Skin:  Intact without significant lesions or rashes. Psych:  Alert and cooperative. Normal mood and affect.    Emiline Mancebo L. Tarri Glenn, MD, MPH 06/25/2022, 9:38 AM

## 2022-06-29 NOTE — Progress Notes (Signed)
I show that this patient has appointment on 07/08/2022 with pulmonology. Can you verify that for me? His CT scan is concerning for mediastinal mass rather than pneumonia as well as continued patchy infiltration.  Thanks  -HB

## 2022-06-29 NOTE — Progress Notes (Deleted)
Name: Brett Campbell  MRN/ DOB: [id number], [birth year]    Age/ Sex: 61 y.o., male     PCP: [name], NP   Reason for Endocrinology Evaluation: Hypocalcemia and Hypothyroidism     Initial Endocrinology Clinic Visit: 11/22/2020    PATIENT IDENTIFIER: Brett Campbell is a 61 y.o., male with a past medical history of .Deafness due to scarlet fever at 15 months, COPD, and hypocalcemia and Hypothyroidism.  He has followed with [facility] since 11/22/2020 for consultative assistance with management of his Hypothyroidism/Hypocalcemia.   HISTORICAL SUMMARY:  Moved from [place]    He is accompanied by his sister [name] and sign language interpreter [place] with sister  HYPOCALCEMIA HISTORY  He was noted with hypocalcemia  Many years ago. Pt is a poor historian and most of the history was obtained from the sister.  Pt is not aware of history of hypocalcemia Per sister he has been diagnosed with this many years ago . He is on 2 Tums TID , and calcitriol 2 a day. NO D3 prescription Has not taken MVI in a couple of weeks   Denies radiation exposure       HYPOTHYROID HISTORY:   S/P total thyroidectomy at age 59 due to goiter . Has been on LT-4 replacement since then. Has history of non compliance requiring hospitalization. Since he has been living with the sister, compliance has improved. He is using a pill box.   Unknown FH of thyroid disease  SUBJECTIVE:    Today (06/29/2022):  [name] is here for Hypocalcemia and Hypothyroidism.  He is accompanied by his Sister [name] and a sign language interpreter  His energy has been good  Sister c/o his coughing which at times productive clear  Continues with diarrhea  Denies palpitations  Denies muscle cramps     HOME ENDOCRINE MEDICATIONS Calcitriol 0.25 mcg , 1 cap daily  calcium 600/ Vitamin D 20 mcg, 2 tabs BID  Levothyroxine 150 mcg daily      HISTORY:  Past Medical History:   Past Medical History:  Diagnosis Date   Calcium deficiency    Cataract    COPD (chronic obstructive pulmonary disease) ([place])    Deaf    Hypertension    Hypothyroidism    Personal history of colonic polyps 11/03/2020   Scarlet fever    Sleep apnea    no longer uses CPAP   Thyroid disease    Tuberculosis    [place] Health department is contact with him due to hx.   Past Surgical History:  Past Surgical History:  Procedure Laterality Date   CHOLECYSTECTOMY     COLONOSCOPY     +5years   LAPAROSCOPIC RIGHT HEMI COLECTOMY N/A 02/26/2021   Procedure: LAPAROSCOPIC RIGHT HEMI COLECTOMY;  Surgeon: Ileana Roup, MD;  Location: [facility];  Service: General;  Laterality: N/A;   THYROIDECTOMY     Social History:  reports that he has been smoking cigarettes. He has a 11.25 pack-year smoking history. He has never used smokeless tobacco. He reports current alcohol use of about 1.0 - 2.0 standard drink of alcohol per week. He reports current drug use. Drug: Marijuana. Family History:  Family History  Problem Relation Age of Onset   CAD Mother    Diabetes Mellitus II Mother    Diabetes Mother    CAD Father    Heart attack Father    Heart disease Father    Diabetes Sister  Colon cancer Neg Hx    Esophageal cancer Neg Hx    Stomach cancer Neg Hx    Colon polyps Neg Hx      HOME MEDICATIONS: Allergies as of 06/30/2022       Reactions   Penicillins    Unknown Did it involve swelling of the face/tongue/throat, SOB, or low BP? Unknown Did it involve sudden or severe rash/hives, skin peeling, or any reaction on the inside of your mouth or nose? Unknown Did you need to seek medical attention at a hospital or doctor's office? Unknown When did it last happen?     childhood  If all above answers are "NO", may proceed with cephalosporin use.        Medication List        Accurate as of June 29, 2022 11:28 AM. If you have any questions, ask your nurse or doctor.           amLODipine 5 MG tablet Commonly known as: NORVASC Take 1 tablet (5 mg total) by mouth daily.   atorvastatin 10 MG tablet Commonly known as: LIPITOR Take 1 tablet (10 mg total) by mouth daily.   calcitRIOL 0.25 MCG capsule Commonly known as: ROCALTROL Take 1 capsule (0.25 mcg total) by mouth daily. What changed: how much to take   Caltrate 600+D Plus Minerals 600-800 MG-UNIT Chew Chew 1,200 mg by mouth 2 (two) times daily. What changed: how much to take   clarithromycin 500 MG tablet Commonly known as: BIAXIN Take 1 tablet (500 mg total) by mouth 2 (two) times daily.   colestipol 1 g tablet Commonly known as: COLESTID Take 1 tablet (1 g total) by mouth 2 (two) times daily.   ibuprofen 200 MG tablet Commonly known as: ADVIL Take 400-800 mg by mouth every 6 (six) hours as needed for moderate pain.   levofloxacin 250 MG tablet Commonly known as: Levaquin Take 1 tablet (250 mg total) by mouth daily.   levothyroxine 150 MCG tablet Commonly known as: SYNTHROID Take 1 tablet (150 mcg total) by mouth daily.   lisinopril 10 MG tablet Commonly known as: ZESTRIL Take 1 tablet (10 mg total) by mouth daily.   potassium chloride SA 20 MEQ tablet Commonly known as: KLOR-CON M Take 1 tablet (20 mEq total) by mouth daily.          OBJECTIVE:   PHYSICAL EXAM: VS: There were no vitals taken for this visit.   EXAM: General: Pt appears well and is in NAD  Neck: General: Supple without adenopathy. Thyroid:  No goiter or nodules appreciated.   Lungs: Clear with good BS bilat with no rales, rhonchi, or wheezes  Heart: Auscultation: RRR.  Abdomen: Normoactive bowel sounds, soft, nontender, without masses or organomegaly palpable  Extremities:  BL LE: No pretibial edema normal ROM and strength.     DATA REVIEWED:    Latest Reference Range & Units 12/26/21 08:51  Sodium 135 - 145 mEq/L 138  Potassium 3.5 - 5.1 mEq/L 4.3  Chloride 96 - 112 mEq/L 102  CO2 19 - 32  mEq/L 24  Glucose 70 - 99 mg/dL 95  BUN 6 - 23 mg/dL 23  Creatinine 0.40 - 1.50 mg/dL 1.37  Calcium 8.4 - 10.5 mg/dL 9.8  Albumin 3.5 - 5.2 g/dL 4.5  GFR >60.00 mL/min 56.31 (L)    Latest Reference Range & Units 12/26/21 08:51  VITD 30.00 - 100.00 ng/mL 48.05      Latest Reference Range & Units 12/26/21 08:51  TSH 0.35 - 5.50 uIU/mL 0.12 (L)  T4,Free(Direct) 0.60 - 1.60 ng/dL 1.79 (H)      ASSESSMENT / PLAN / RECOMMENDATIONS:   Hypoparathyroidism    -Per sister patient continues with imperfect adherence to calcium intake -The goals of therapy in patients with hypoparathyroidism are to relieve symptoms, to raise and maintain the serum calcium concentration in the low-normal range( 8.0 to 9 mg/dL), and to prevent iatrogenic development of kidney stones. Attainment of higher serum calcium values is not necessary and is usually limited by the development of hypercalciuria due to the loss of renal calcium-retaining effects of parathyroid hormone .  -His serum calcium is at the upper limit of normal, will reduce calcitriol as below    Medications :  - Continue calcium 600 mg, 2 tablets with Lunch and 2 tablet with Supper -Decrease calcitriol 0.25 mcg  to 1 capsule a day       2. Post Surgical Hypothyroidism:   - No local neck symptoms -No local neck symptoms -TSH continues to be low but improving, will reduce levothyroxine as below  Medications Stop levothyroxine 175 mcg daily Start levothyroxine 150 mcg daily       3. Vitamin D Insufficiency:    - This has normalized in the past, no changes to Vitamin D replacement   - Its part of his calcium tablets   F/U in 6 months     Signed electronically by: Mack Guise, MD  [facility]  [address] [place], [place] [address] Phone: [phone] FAX: [phone]      CC: [name], NP [place] [address] Phone:  [phone]  Fax: [phone]   Return to Endocrinology clinic as below: Future Appointments  Date Time Provider [place]  06/30/2022  8:30 AM Melbert Botelho, Melanie Crazier, MD [facility] None  06/30/2022 11:00 AM Maryjane Hurter, MD [facility] None  08/20/2022  9:00 AM [facility] None  09/07/2022  2:30 PM [name], NP [facility] None

## 2022-06-30 ENCOUNTER — Institutional Professional Consult (permissible substitution): Payer: Medicare HMO | Admitting: Student

## 2022-06-30 ENCOUNTER — Ambulatory Visit: Payer: Medicare HMO | Admitting: Internal Medicine

## 2022-07-08 ENCOUNTER — Ambulatory Visit: Payer: Medicare HMO | Admitting: Pulmonary Disease

## 2022-07-08 ENCOUNTER — Encounter: Payer: Self-pay | Admitting: Pulmonary Disease

## 2022-07-08 VITALS — BP 124/62 | HR 62 | Ht 70.0 in | Wt 179.0 lb

## 2022-07-08 DIAGNOSIS — E278 Other specified disorders of adrenal gland: Secondary | ICD-10-CM | POA: Diagnosis not present

## 2022-07-08 DIAGNOSIS — J438 Other emphysema: Secondary | ICD-10-CM

## 2022-07-08 DIAGNOSIS — F17210 Nicotine dependence, cigarettes, uncomplicated: Secondary | ICD-10-CM | POA: Diagnosis not present

## 2022-07-08 DIAGNOSIS — R918 Other nonspecific abnormal finding of lung field: Secondary | ICD-10-CM | POA: Diagnosis not present

## 2022-07-08 NOTE — H&P (View-Only) (Signed)
Synopsis: Referred in November 2023 for abnormal chest imaging by Thornton Park, MD  Subjective:   PATIENT ID: Brett Campbell GENDER: male DOB: 07-22-1961, MRN: [id number]   HPI  Chief Complaint  Patient presents with   Consult    Referred by PCP for abnormal CT on 10/30. Per sister, he has had PNA for the past few weeks. Has taken Mucinex for cough.    Brett Campbell is a 61 year old deaf male, daily smoker with history of COPD, OSA not on CPAP and previously treated latent TB by [place] in 2021 who is referred to pulmonary clinic for lung mass.  Patient reports developing cough in August. Chest x-rays were concerning for pneumonia so he has been treated with multiple rounds of antibiotics with no improvement in his chest x-rays. CT Chest scan obtained 06/22/22 shows lobular left hilar mass extending into the left lower lobe measuring 6.5 x 4.2 x 3.5cm with occlusion of the left lower lobe bronchus and patchy airspace disease in the left lower lobe. Heterogenous adrenal nodules bilaterally measuring up to 3.5cm. Mild paraseptal emphysematous changes noted.   Patient continues to have cough, denies mucous production or hemoptysis. He denies weight loss, night sweats, fevers or chills. His appetite remains normal.   He did not tolerate CPAP in the past. He tried an inhaler in the past and it did not help his breathing. He will have exertional dyspnea occasionally. Denies significant issues with wheezing or chest tightness.  He is smoking 1 cigarette per day. He has a 45+ pack year smoking history. He also smokes marijuana. 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 He lives with his sister. He is deaf and communicates with sign language interpreter.  Past Medical History:  Diagnosis Date   Calcium deficiency    Cataract    COPD (chronic obstructive pulmonary disease) ([place])    Deaf     Hypertension    Hypothyroidism    Personal history of colonic polyps 11/03/2020   Scarlet fever    Sleep apnea    no longer uses CPAP   Thyroid disease    Tuberculosis    [place] Health department is contact with him due to hx.     Family History  Problem Relation Age of Onset   CAD Mother    Diabetes Mellitus II Mother    Diabetes Mother    CAD Father    Heart attack Father    Heart disease Father    Diabetes Sister    Colon cancer Neg Hx    Esophageal cancer Neg Hx    Stomach cancer Neg Hx    Colon polyps Neg Hx      Social History   Socioeconomic History   Marital status: Single    Spouse name: Not on file   Number of children: Not on file   Years of education: Not on file   Highest education level: Not on file  Occupational History   Not on file  Tobacco Use   Smoking status: Some Days    Packs/day: 0.25    Years: 45.00    Total pack years: 11.25    Types: Cigarettes   Smokeless tobacco: Never   Tobacco comments:    pt states he smokes 1 cigarette per day  Vaping Use   Vaping Use: Never used  Substance and Sexual Activity   Alcohol use: Yes  Alcohol/week: 1.0 - 2.0 standard drink of alcohol    Types: 1 - 2 Cans of beer per week    Comment: occas   Drug use: Yes    Types: Marijuana   Sexual activity: Not Currently  Other Topics Concern   Not on file  Social History Narrative   Not on file   Social Determinants of Health   Financial Resource Strain: Not on file  Food Insecurity: Not on file  Transportation Needs: No Transportation Needs (03/31/2022)   PRAPARE - Hydrologist (Medical): No    Lack of Transportation (Non-Medical): No  Physical Activity: Not on file  Stress: Not on file  Social Connections: Not on file  Intimate Partner Violence: Not on file     Allergies  Allergen Reactions   Penicillins     Unknown  Did it involve swelling of the face/tongue/throat, SOB, or low BP? Unknown Did it involve sudden  or severe rash/hives, skin peeling, or any reaction on the inside of your mouth or nose? Unknown Did you need to seek medical attention at a hospital or doctor's office? Unknown When did it last happen?     childhood  If all above answers are "NO", may proceed with cephalosporin use.       Outpatient Medications Prior to Visit  Medication Sig Dispense Refill   amLODipine (NORVASC) 5 MG tablet Take 1 tablet (5 mg total) by mouth daily. 90 tablet 3   atorvastatin (LIPITOR) 10 MG tablet Take 1 tablet (10 mg total) by mouth daily. 90 tablet 3   calcitRIOL (ROCALTROL) 0.25 MCG capsule Take 1 capsule (0.25 mcg total) by mouth daily. (Patient taking differently: Take 0.5 mcg by mouth daily.) 90 capsule 3   Calcium Carbonate-Vit D-Min (CALTRATE 600+D PLUS MINERALS) 600-800 MG-UNIT CHEW Chew 1,200 mg by mouth 2 (two) times daily. (Patient taking differently: Chew 2 tablets by mouth 2 (two) times daily.) 360 tablet 3   colestipol (COLESTID) 1 g tablet Take 1 tablet (1 g total) by mouth 2 (two) times daily. 60 tablet 5   ibuprofen (ADVIL) 200 MG tablet Take 400-800 mg by mouth every 6 (six) hours as needed for moderate pain.     levothyroxine (SYNTHROID) 150 MCG tablet Take 1 tablet (150 mcg total) by mouth daily. 90 tablet 3   lisinopril (ZESTRIL) 10 MG tablet Take 1 tablet (10 mg total) by mouth daily. 90 tablet 0   potassium chloride SA (KLOR-CON M) 20 MEQ tablet Take 1 tablet (20 mEq total) by mouth daily. 90 tablet 3   clarithromycin (BIAXIN) 500 MG tablet Take 1 tablet (500 mg total) by mouth 2 (two) times daily. (Patient not taking: Reported on 06/25/2022) 14 tablet 0   levofloxacin (LEVAQUIN) 250 MG tablet Take 1 tablet (250 mg total) by mouth daily. (Patient not taking: Reported on 06/25/2022) 7 tablet 0   No facility-administered medications prior to visit.   Review of Systems  Constitutional:  Negative for chills, fever, malaise/fatigue and weight loss.  HENT:  Negative for congestion, sinus  pain and sore throat.   Eyes: Negative.   Respiratory:  Positive for cough. Negative for hemoptysis, sputum production, shortness of breath and wheezing.   Cardiovascular:  Negative for chest pain, palpitations, orthopnea, claudication and leg swelling.  Gastrointestinal:  Negative for abdominal pain, heartburn, nausea and vomiting.  Genitourinary: Negative.   Musculoskeletal:  Negative for joint pain and myalgias.  Skin:  Negative for rash.  Neurological:  Negative for  weakness.  Endo/Heme/Allergies: Negative.   Psychiatric/Behavioral: Negative.     Objective:   Vitals:   07/08/22 1349  BP: 124/62  Pulse: 62  SpO2: 96%  Weight: 179 lb (81.2 kg)  Height: 5\' 10"  (1.778 m)     Physical Exam Constitutional:      General: He is not in acute distress. HENT:     Head: Normocephalic and atraumatic.  Eyes:     Extraocular Movements: Extraocular movements intact.     Conjunctiva/sclera: Conjunctivae normal.     Pupils: Pupils are equal, round, and reactive to light.  Cardiovascular:     Rate and Rhythm: Normal rate and regular rhythm.     Pulses: Normal pulses.     Heart sounds: Normal heart sounds. No murmur heard. Pulmonary:     Effort: Pulmonary effort is normal.     Breath sounds: Normal breath sounds.  Abdominal:     General: Bowel sounds are normal.     Palpations: Abdomen is soft.  Musculoskeletal:     Right lower leg: No edema.     Left lower leg: No edema.  Lymphadenopathy:     Cervical: No cervical adenopathy.  Skin:    General: Skin is warm and dry.  Neurological:     General: No focal deficit present.     Mental Status: He is alert.  Psychiatric:        Mood and Affect: Mood normal.        Behavior: Behavior normal.        Thought Content: Thought content normal.        Judgment: Judgment normal.    CBC    Component Value Date/Time   WBC 10.7 04/10/2022 1014   WBC 8.6 03/30/2022 0633   RBC 4.85 04/10/2022 1014   RBC 3.99 (L) 03/30/2022 0633   HGB  14.6 04/10/2022 1014   HCT 44.0 04/10/2022 1014   PLT 354 04/10/2022 1014   MCV 91 04/10/2022 1014   MCH 30.1 04/10/2022 1014   MCH 30.6 03/30/2022 0633   MCHC 33.2 04/10/2022 1014   MCHC 35.1 03/30/2022 0633   RDW 12.0 04/10/2022 1014   LYMPHSABS 1.5 03/30/2022 0633   LYMPHSABS 2.9 09/02/2021 0900   MONOABS 0.6 03/30/2022 0633   EOSABS 0.2 03/30/2022 0633   EOSABS 0.6 (H) 09/02/2021 0900   BASOSABS 0.1 03/30/2022 0633   BASOSABS 0.1 09/02/2021 0900   Chest imaging: CT Chest w contrast 06/19/22 1. Lobular left hilar mass extending into the left lower lobe measuring 6.5 x 4.2 x 3.5 cm. Findings are suspicious for neoplasm. Consider one of the following in 3 months for both low-risk and high-risk individuals: (a) repeat chest CT, (b) follow-up PET-CT, or (c) tissue sampling. This recommendation follows the consensus statement: Guidelines for Management of Incidental Pulmonary Nodules Detected on CT Images: From the Fleischner Society 2017; Radiology 2017; 284:228-243. 2. Occlusion of the left lower lobe bronchus with patchy airspace disease in the left lower lobe, possible atelectasis or postobstructive pneumonitis. 3. Heterogeneous adrenal nodules bilaterally measuring up to 3.5 cm, concerning for metastatic disease. 4. Emphysema. 5. Aortic atherosclerosis and coronary artery calcifications.  PFT:     No data to display          Labs:  Path:  Echo:  Heart Catheterization:  Assessment & Plan:   Lung mass - Plan: NM PET Image Initial (PI) Skull Base To Thigh, Ambulatory referral to Pulmonology  Mass of both adrenal glands [facility])  Discussion: Brett Campbell is  a 61 year old deaf male, daily smoker with history of COPD, OSA not on CPAP and previously treated latent TB by [place] in 2021 who is referred to pulmonary clinic for lung mass.  He has large left hilar lung mass with occlusion of left lower lobe bronchus concerning for primary lung  malignancy given his smoking history. He also has bilateral adrenal gland masses measuring 3.5cm.  I have ordered PET scan to evaluate lung mass and adrenal masses. We will schedule patient for bronchoscopy/EBUS for biopsies in the near future.   65 minutes was spent on this visit which included direct patient care, review of records/imaging, discussing risks/benefits of procedure and scheduling of procedure.  Follow up in 1 month  Freda Jackson, MD [place] [facility] Office: [phone]   Current Outpatient Medications:    amLODipine (NORVASC) 5 MG tablet, Take 1 tablet (5 mg total) by mouth daily., Disp: 90 tablet, Rfl: 3   atorvastatin (LIPITOR) 10 MG tablet, Take 1 tablet (10 mg total) by mouth daily., Disp: 90 tablet, Rfl: 3   calcitRIOL (ROCALTROL) 0.25 MCG capsule, Take 1 capsule (0.25 mcg total) by mouth daily. (Patient taking differently: Take 0.5 mcg by mouth daily.), Disp: 90 capsule, Rfl: 3   Calcium Carbonate-Vit D-Min (CALTRATE 600+D PLUS MINERALS) 600-800 MG-UNIT CHEW, Chew 1,200 mg by mouth 2 (two) times daily. (Patient taking differently: Chew 2 tablets by mouth 2 (two) times daily.), Disp: 360 tablet, Rfl: 3   colestipol (COLESTID) 1 g tablet, Take 1 tablet (1 g total) by mouth 2 (two) times daily., Disp: 60 tablet, Rfl: 5   ibuprofen (ADVIL) 200 MG tablet, Take 400-800 mg by mouth every 6 (six) hours as needed for moderate pain., Disp: , Rfl:    levothyroxine (SYNTHROID) 150 MCG tablet, Take 1 tablet (150 mcg total) by mouth daily., Disp: 90 tablet, Rfl: 3   lisinopril (ZESTRIL) 10 MG tablet, Take 1 tablet (10 mg total) by mouth daily., Disp: 90 tablet, Rfl: 0   potassium chloride SA (KLOR-CON M) 20 MEQ tablet, Take 1 tablet (20 mEq total) by mouth daily., Disp: 90 tablet, Rfl: 3

## 2022-07-08 NOTE — Progress Notes (Signed)
Synopsis: Referred in November 2023 for abnormal chest imaging by Thornton Park, MD  Subjective:   PATIENT ID: Brett Campbell GENDER: male DOB: 21-Oct-1960, MRN: [id number]   HPI  Chief Complaint  Patient presents with   Consult    Referred by PCP for abnormal CT on 10/30. Per sister, he has had PNA for the past few weeks. Has taken Mucinex for cough.    Brett Campbell is a 61 year old deaf male, daily smoker with history of COPD, OSA not on CPAP and previously treated latent TB by [place] in 2021 who is referred to pulmonary clinic for lung mass.  Patient reports developing cough in August. Chest x-rays were concerning for pneumonia so he has been treated with multiple rounds of antibiotics with no improvement in his chest x-rays. CT Chest scan obtained 06/22/22 shows lobular left hilar mass extending into the left lower lobe measuring 6.5 x 4.2 x 3.5cm with occlusion of the left lower lobe bronchus and patchy airspace disease in the left lower lobe. Heterogenous adrenal nodules bilaterally measuring up to 3.5cm. Mild paraseptal emphysematous changes noted.   Patient continues to have cough, denies mucous production or hemoptysis. He denies weight loss, night sweats, fevers or chills. His appetite remains normal.   He did not tolerate CPAP in the past. He tried an inhaler in the past and it did not help his breathing. He will have exertional dyspnea occasionally. Denies significant issues with wheezing or chest tightness.  He is smoking 1 cigarette per day. He has a 45+ pack year smoking history. He also smokes marijuana. 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 He lives with his sister. He is deaf and communicates with sign language interpreter.  Past Medical History:  Diagnosis Date   Calcium deficiency    Cataract    COPD (chronic obstructive pulmonary disease) ([place])    Deaf     Hypertension    Hypothyroidism    Personal history of colonic polyps 11/03/2020   Scarlet fever    Sleep apnea    no longer uses CPAP   Thyroid disease    Tuberculosis    [place]. Health department is contact with him due to hx.     Family History  Problem Relation Age of Onset   CAD Mother    Diabetes Mellitus II Mother    Diabetes Mother    CAD Father    Heart attack Father    Heart disease Father    Diabetes Sister    Colon cancer Neg Hx    Esophageal cancer Neg Hx    Stomach cancer Neg Hx    Colon polyps Neg Hx      Social History   Socioeconomic History   Marital status: Single    Spouse name: Not on file   Number of children: Not on file   Years of education: Not on file   Highest education level: Not on file  Occupational History   Not on file  Tobacco Use   Smoking status: Some Days    Packs/day: 0.25    Years: 45.00    Total pack years: 11.25    Types: Cigarettes   Smokeless tobacco: Never   Tobacco comments:    pt states he smokes 1 cigarette per day  Vaping Use   Vaping Use: Never used  Substance and Sexual Activity   Alcohol use: Yes  Alcohol/week: 1.0 - 2.0 standard drink of alcohol    Types: 1 - 2 Cans of beer per week    Comment: occas   Drug use: Yes    Types: Marijuana   Sexual activity: Not Currently  Other Topics Concern   Not on file  Social History Narrative   Not on file   Social Determinants of Health   Financial Resource Strain: Not on file  Food Insecurity: Not on file  Transportation Needs: No Transportation Needs (03/31/2022)   PRAPARE - Hydrologist (Medical): No    Lack of Transportation (Non-Medical): No  Physical Activity: Not on file  Stress: Not on file  Social Connections: Not on file  Intimate Partner Violence: Not on file     Allergies  Allergen Reactions   Penicillins     Unknown  Did it involve swelling of the face/tongue/throat, SOB, or low BP? Unknown Did it involve sudden  or severe rash/hives, skin peeling, or any reaction on the inside of your mouth or nose? Unknown Did you need to seek medical attention at a hospital or doctor's office? Unknown When did it last happen?     childhood  If all above answers are "NO", may proceed with cephalosporin use.       Outpatient Medications Prior to Visit  Medication Sig Dispense Refill   amLODipine (NORVASC) 5 MG tablet Take 1 tablet (5 mg total) by mouth daily. 90 tablet 3   atorvastatin (LIPITOR) 10 MG tablet Take 1 tablet (10 mg total) by mouth daily. 90 tablet 3   calcitRIOL (ROCALTROL) 0.25 MCG capsule Take 1 capsule (0.25 mcg total) by mouth daily. (Patient taking differently: Take 0.5 mcg by mouth daily.) 90 capsule 3   Calcium Carbonate-Vit D-Min (CALTRATE 600+D PLUS MINERALS) 600-800 MG-UNIT CHEW Chew 1,200 mg by mouth 2 (two) times daily. (Patient taking differently: Chew 2 tablets by mouth 2 (two) times daily.) 360 tablet 3   colestipol (COLESTID) 1 g tablet Take 1 tablet (1 g total) by mouth 2 (two) times daily. 60 tablet 5   ibuprofen (ADVIL) 200 MG tablet Take 400-800 mg by mouth every 6 (six) hours as needed for moderate pain.     levothyroxine (SYNTHROID) 150 MCG tablet Take 1 tablet (150 mcg total) by mouth daily. 90 tablet 3   lisinopril (ZESTRIL) 10 MG tablet Take 1 tablet (10 mg total) by mouth daily. 90 tablet 0   potassium chloride SA (KLOR-CON M) 20 MEQ tablet Take 1 tablet (20 mEq total) by mouth daily. 90 tablet 3   clarithromycin (BIAXIN) 500 MG tablet Take 1 tablet (500 mg total) by mouth 2 (two) times daily. (Patient not taking: Reported on 06/25/2022) 14 tablet 0   levofloxacin (LEVAQUIN) 250 MG tablet Take 1 tablet (250 mg total) by mouth daily. (Patient not taking: Reported on 06/25/2022) 7 tablet 0   No facility-administered medications prior to visit.   Review of Systems  Constitutional:  Negative for chills, fever, malaise/fatigue and weight loss.  HENT:  Negative for congestion, sinus  pain and sore throat.   Eyes: Negative.   Respiratory:  Positive for cough. Negative for hemoptysis, sputum production, shortness of breath and wheezing.   Cardiovascular:  Negative for chest pain, palpitations, orthopnea, claudication and leg swelling.  Gastrointestinal:  Negative for abdominal pain, heartburn, nausea and vomiting.  Genitourinary: Negative.   Musculoskeletal:  Negative for joint pain and myalgias.  Skin:  Negative for rash.  Neurological:  Negative for  weakness.  Endo/Heme/Allergies: Negative.   Psychiatric/Behavioral: Negative.     Objective:   Vitals:   07/08/22 1349  BP: 124/62  Pulse: 62  SpO2: 96%  Weight: 179 lb (81.2 kg)  Height: 5\' 10"  (1.778 m)     Physical Exam Constitutional:      General: He is not in acute distress. HENT:     Head: Normocephalic and atraumatic.  Eyes:     Extraocular Movements: Extraocular movements intact.     Conjunctiva/sclera: Conjunctivae normal.     Pupils: Pupils are equal, round, and reactive to light.  Cardiovascular:     Rate and Rhythm: Normal rate and regular rhythm.     Pulses: Normal pulses.     Heart sounds: Normal heart sounds. No murmur heard. Pulmonary:     Effort: Pulmonary effort is normal.     Breath sounds: Normal breath sounds.  Abdominal:     General: Bowel sounds are normal.     Palpations: Abdomen is soft.  Musculoskeletal:     Right lower leg: No edema.     Left lower leg: No edema.  Lymphadenopathy:     Cervical: No cervical adenopathy.  Skin:    General: Skin is warm and dry.  Neurological:     General: No focal deficit present.     Mental Status: He is alert.  Psychiatric:        Mood and Affect: Mood normal.        Behavior: Behavior normal.        Thought Content: Thought content normal.        Judgment: Judgment normal.    CBC    Component Value Date/Time   WBC 10.7 04/10/2022 1014   WBC 8.6 03/30/2022 0633   RBC 4.85 04/10/2022 1014   RBC 3.99 (L) 03/30/2022 0633   HGB  14.6 04/10/2022 1014   HCT 44.0 04/10/2022 1014   PLT 354 04/10/2022 1014   MCV 91 04/10/2022 1014   MCH 30.1 04/10/2022 1014   MCH 30.6 03/30/2022 0633   MCHC 33.2 04/10/2022 1014   MCHC 35.1 03/30/2022 0633   RDW 12.0 04/10/2022 1014   LYMPHSABS 1.5 03/30/2022 0633   LYMPHSABS 2.9 09/02/2021 0900   MONOABS 0.6 03/30/2022 0633   EOSABS 0.2 03/30/2022 0633   EOSABS 0.6 (H) 09/02/2021 0900   BASOSABS 0.1 03/30/2022 0633   BASOSABS 0.1 09/02/2021 0900   Chest imaging: CT Chest w contrast 06/19/22 1. Lobular left hilar mass extending into the left lower lobe measuring 6.5 x 4.2 x 3.5 cm. Findings are suspicious for neoplasm. Consider one of the following in 3 months for both low-risk and high-risk individuals: (a) repeat chest CT, (b) follow-up PET-CT, or (c) tissue sampling. This recommendation follows the consensus statement: Guidelines for Management of Incidental Pulmonary Nodules Detected on CT Images: From the Fleischner Society 2017; Radiology 2017; 284:228-243. 2. Occlusion of the left lower lobe bronchus with patchy airspace disease in the left lower lobe, possible atelectasis or postobstructive pneumonitis. 3. Heterogeneous adrenal nodules bilaterally measuring up to 3.5 cm, concerning for metastatic disease. 4. Emphysema. 5. Aortic atherosclerosis and coronary artery calcifications.  PFT:     No data to display          Labs:  Path:  Echo:  Heart Catheterization:  Assessment & Plan:   Lung mass - Plan: NM PET Image Initial (PI) Skull Base To Thigh, Ambulatory referral to Pulmonology  Mass of both adrenal glands [facility])  Discussion: Brett Campbell is  a 61 year old deaf male, daily smoker with history of COPD, OSA not on CPAP and previously treated latent TB by [place] in 2021 who is referred to pulmonary clinic for lung mass.  He has large left hilar lung mass with occlusion of left lower lobe bronchus concerning for primary lung  malignancy given his smoking history. He also has bilateral adrenal gland masses measuring 3.5cm.  I have ordered PET scan to evaluate lung mass and adrenal masses. We will schedule patient for bronchoscopy/EBUS for biopsies in the near future.   65 minutes was spent on this visit which included direct patient care, review of records/imaging, discussing risks/benefits of procedure and scheduling of procedure.  Follow up in 1 month  Freda Jackson, MD [place] [facility] Office: [phone]   Current Outpatient Medications:    amLODipine (NORVASC) 5 MG tablet, Take 1 tablet (5 mg total) by mouth daily., Disp: 90 tablet, Rfl: 3   atorvastatin (LIPITOR) 10 MG tablet, Take 1 tablet (10 mg total) by mouth daily., Disp: 90 tablet, Rfl: 3   calcitRIOL (ROCALTROL) 0.25 MCG capsule, Take 1 capsule (0.25 mcg total) by mouth daily. (Patient taking differently: Take 0.5 mcg by mouth daily.), Disp: 90 capsule, Rfl: 3   Calcium Carbonate-Vit D-Min (CALTRATE 600+D PLUS MINERALS) 600-800 MG-UNIT CHEW, Chew 1,200 mg by mouth 2 (two) times daily. (Patient taking differently: Chew 2 tablets by mouth 2 (two) times daily.), Disp: 360 tablet, Rfl: 3   colestipol (COLESTID) 1 g tablet, Take 1 tablet (1 g total) by mouth 2 (two) times daily., Disp: 60 tablet, Rfl: 5   ibuprofen (ADVIL) 200 MG tablet, Take 400-800 mg by mouth every 6 (six) hours as needed for moderate pain., Disp: , Rfl:    levothyroxine (SYNTHROID) 150 MCG tablet, Take 1 tablet (150 mcg total) by mouth daily., Disp: 90 tablet, Rfl: 3   lisinopril (ZESTRIL) 10 MG tablet, Take 1 tablet (10 mg total) by mouth daily., Disp: 90 tablet, Rfl: 0   potassium chloride SA (KLOR-CON M) 20 MEQ tablet, Take 1 tablet (20 mEq total) by mouth daily., Disp: 90 tablet, Rfl: 3

## 2022-07-08 NOTE — Patient Instructions (Signed)
We will schedule you for CT PET scan  We will schedule you for bronchoscopy with biopsies.   Follow up in 4 weeks

## 2022-07-10 ENCOUNTER — Encounter: Payer: Self-pay | Admitting: Pulmonary Disease

## 2022-07-13 ENCOUNTER — Encounter: Payer: Self-pay | Admitting: Pulmonary Disease

## 2022-07-15 ENCOUNTER — Other Ambulatory Visit: Payer: Self-pay | Admitting: Nurse Practitioner

## 2022-07-15 DIAGNOSIS — E876 Hypokalemia: Secondary | ICD-10-CM

## 2022-07-15 DIAGNOSIS — I10 Essential (primary) hypertension: Secondary | ICD-10-CM

## 2022-07-15 DIAGNOSIS — E782 Mixed hyperlipidemia: Secondary | ICD-10-CM

## 2022-07-21 ENCOUNTER — Other Ambulatory Visit: Payer: Self-pay | Admitting: *Deleted

## 2022-07-21 ENCOUNTER — Other Ambulatory Visit: Payer: Medicare HMO

## 2022-07-21 DIAGNOSIS — Z01818 Encounter for other preprocedural examination: Secondary | ICD-10-CM

## 2022-07-23 ENCOUNTER — Encounter (HOSPITAL_COMMUNITY): Payer: Self-pay | Admitting: Pulmonary Disease

## 2022-07-23 ENCOUNTER — Other Ambulatory Visit: Payer: Self-pay

## 2022-07-23 LAB — NOVEL CORONAVIRUS, NAA: SARS-CoV-2, NAA: NOT DETECTED

## 2022-07-23 LAB — SPECIMEN STATUS REPORT

## 2022-07-23 NOTE — Progress Notes (Signed)
Spoke with pt's sister, [name] for pre-op call. DPR on file. Pt is deaf. Sign language interpreter has been requested. Have not gotten confirmation at this time though. She states pt does not have a cardiac history. Pt is treated for HTN and hypothyroidism. She states pt is not diabetic.   Covid test done today and it's negative.   Shower instructions given to [name] and she voiced understanding.

## 2022-07-24 ENCOUNTER — Encounter (HOSPITAL_COMMUNITY)
Admission: RE | Disposition: A | Discharge status: Home / Self Care | Payer: Self-pay | Source: Home / Self Care | Attending: Pulmonary Disease

## 2022-07-24 ENCOUNTER — Encounter (HOSPITAL_COMMUNITY): Payer: Self-pay | Admitting: Pulmonary Disease

## 2022-07-24 ENCOUNTER — Ambulatory Visit (HOSPITAL_COMMUNITY): Payer: Medicare HMO | Admitting: Certified Registered"

## 2022-07-24 ENCOUNTER — Ambulatory Visit (HOSPITAL_COMMUNITY): Payer: Medicare HMO

## 2022-07-24 ENCOUNTER — Ambulatory Visit (HOSPITAL_COMMUNITY)
Admission: RE | Admit: 2022-07-24 | Discharge: 2022-07-24 | Disposition: A | Discharge status: Home / Self Care | Payer: Medicare HMO | Attending: Pulmonary Disease | Admitting: Pulmonary Disease

## 2022-07-24 ENCOUNTER — Ambulatory Visit (HOSPITAL_BASED_OUTPATIENT_CLINIC_OR_DEPARTMENT_OTHER): Payer: Medicare HMO | Admitting: Certified Registered"

## 2022-07-24 DIAGNOSIS — J449 Chronic obstructive pulmonary disease, unspecified: Secondary | ICD-10-CM | POA: Insufficient documentation

## 2022-07-24 DIAGNOSIS — E039 Hypothyroidism, unspecified: Secondary | ICD-10-CM | POA: Diagnosis not present

## 2022-07-24 DIAGNOSIS — C3432 Malignant neoplasm of lower lobe, left bronchus or lung: Secondary | ICD-10-CM | POA: Diagnosis not present

## 2022-07-24 DIAGNOSIS — F17210 Nicotine dependence, cigarettes, uncomplicated: Secondary | ICD-10-CM | POA: Insufficient documentation

## 2022-07-24 DIAGNOSIS — F17200 Nicotine dependence, unspecified, uncomplicated: Secondary | ICD-10-CM

## 2022-07-24 DIAGNOSIS — I10 Essential (primary) hypertension: Secondary | ICD-10-CM | POA: Diagnosis not present

## 2022-07-24 DIAGNOSIS — R918 Other nonspecific abnormal finding of lung field: Secondary | ICD-10-CM

## 2022-07-24 DIAGNOSIS — Z9889 Other specified postprocedural states: Secondary | ICD-10-CM | POA: Diagnosis not present

## 2022-07-24 DIAGNOSIS — G4730 Sleep apnea, unspecified: Secondary | ICD-10-CM | POA: Diagnosis not present

## 2022-07-24 DIAGNOSIS — J189 Pneumonia, unspecified organism: Secondary | ICD-10-CM | POA: Diagnosis not present

## 2022-07-24 HISTORY — PX: VIDEO BRONCHOSCOPY WITH ENDOBRONCHIAL ULTRASOUND: SHX6177

## 2022-07-24 HISTORY — DX: Pneumonia, unspecified organism: J18.9

## 2022-07-24 LAB — POCT I-STAT, CHEM 8
BUN: 14 mg/dL (ref 6–20)
Calcium, Ion: 0.91 mmol/L — ABNORMAL LOW (ref 1.15–1.40)
Chloride: 106 mmol/L (ref 98–111)
Creatinine, Ser: 1.2 mg/dL (ref 0.61–1.24)
Glucose, Bld: 90 mg/dL (ref 70–99)
HCT: 43 % (ref 39.0–52.0)
Hemoglobin: 14.6 g/dL (ref 13.0–17.0)
Potassium: 3.8 mmol/L (ref 3.5–5.1)
Sodium: 139 mmol/L (ref 135–145)
TCO2: 21 mmol/L — ABNORMAL LOW (ref 22–32)

## 2022-07-24 SURGERY — BRONCHOSCOPY, WITH EBUS
Anesthesia: General | Site: Chest

## 2022-07-24 MED ORDER — EPINEPHRINE 1 MG/10ML IJ SOSY
PREFILLED_SYRINGE | INTRAMUSCULAR | Status: DC | PRN
  Administered 2022-07-24: 1 mg via INTRATRACHEAL

## 2022-07-24 MED ORDER — DEXAMETHASONE SODIUM PHOSPHATE 10 MG/ML IJ SOLN
INTRAMUSCULAR | Status: DC | PRN
  Administered 2022-07-24: 10 mg via INTRAVENOUS

## 2022-07-24 MED ORDER — MIDAZOLAM HCL 2 MG/2ML IJ SOLN
INTRAMUSCULAR | Status: AC
  Filled 2022-07-24: qty 2

## 2022-07-24 MED ORDER — CHLORHEXIDINE GLUCONATE 0.12 % MT SOLN
15.0000 mL | Freq: Once | OROMUCOSAL | Status: AC
  Administered 2022-07-24: 15 mL via OROMUCOSAL
  Filled 2022-07-24: qty 15

## 2022-07-24 MED ORDER — SUGAMMADEX SODIUM 200 MG/2ML IV SOLN
INTRAVENOUS | Status: DC | PRN
  Administered 2022-07-24: 200 mg via INTRAVENOUS

## 2022-07-24 MED ORDER — FENTANYL CITRATE (PF) 250 MCG/5ML IJ SOLN
INTRAMUSCULAR | Status: AC
  Filled 2022-07-24: qty 5

## 2022-07-24 MED ORDER — MIDAZOLAM HCL 2 MG/2ML IJ SOLN
INTRAMUSCULAR | Status: DC | PRN
  Administered 2022-07-24: 2 mg via INTRAVENOUS

## 2022-07-24 MED ORDER — ONDANSETRON HCL 4 MG/2ML IJ SOLN
INTRAMUSCULAR | Status: AC
  Filled 2022-07-24: qty 2

## 2022-07-24 MED ORDER — ORAL CARE MOUTH RINSE: 15.0000 mL | Freq: Once | OROMUCOSAL | Status: AC

## 2022-07-24 MED ORDER — LACTATED RINGERS IV SOLN: INTRAVENOUS | Status: DC

## 2022-07-24 MED ORDER — 0.9 % SODIUM CHLORIDE (POUR BTL) OPTIME
TOPICAL | Status: DC | PRN
  Administered 2022-07-24: 1000 mL

## 2022-07-24 MED ORDER — PROPOFOL 10 MG/ML IV BOLUS
INTRAVENOUS | Status: AC
  Filled 2022-07-24: qty 20

## 2022-07-24 MED ORDER — DEXAMETHASONE SODIUM PHOSPHATE 10 MG/ML IJ SOLN
INTRAMUSCULAR | Status: AC
  Filled 2022-07-24: qty 1

## 2022-07-24 MED ORDER — PROPOFOL 10 MG/ML IV BOLUS
INTRAVENOUS | Status: DC | PRN
  Administered 2022-07-24: 150 mg via INTRAVENOUS

## 2022-07-24 MED ORDER — ROCURONIUM BROMIDE 10 MG/ML (PF) SYRINGE
PREFILLED_SYRINGE | INTRAVENOUS | Status: AC
  Filled 2022-07-24: qty 10

## 2022-07-24 MED ORDER — SUCCINYLCHOLINE CHLORIDE 200 MG/10ML IV SOSY
PREFILLED_SYRINGE | INTRAVENOUS | Status: DC | PRN
  Administered 2022-07-24: 130 mg via INTRAVENOUS

## 2022-07-24 MED ORDER — ONDANSETRON HCL 4 MG/2ML IJ SOLN
INTRAMUSCULAR | Status: DC | PRN
  Administered 2022-07-24: 4 mg via INTRAVENOUS

## 2022-07-24 MED ORDER — LIDOCAINE 2% (20 MG/ML) 5 ML SYRINGE
INTRAMUSCULAR | Status: AC
  Filled 2022-07-24: qty 5

## 2022-07-24 MED ORDER — LIDOCAINE 2% (20 MG/ML) 5 ML SYRINGE
INTRAMUSCULAR | Status: DC | PRN
  Administered 2022-07-24: 100 mg via INTRAVENOUS

## 2022-07-24 MED ORDER — FENTANYL CITRATE (PF) 100 MCG/2ML IJ SOLN
INTRAMUSCULAR | Status: DC | PRN
  Administered 2022-07-24: 100 ug via INTRAVENOUS

## 2022-07-24 MED ORDER — HYDROMORPHONE HCL 1 MG/ML IJ SOLN: 0.2500 mg | INTRAMUSCULAR | Status: DC | PRN

## 2022-07-24 MED ORDER — ROCURONIUM BROMIDE 10 MG/ML (PF) SYRINGE
PREFILLED_SYRINGE | INTRAVENOUS | Status: DC | PRN
  Administered 2022-07-24: 35 mg via INTRAVENOUS

## 2022-07-24 MED ORDER — EPINEPHRINE PF 1 MG/ML IJ SOLN
INTRAMUSCULAR | Status: AC
  Filled 2022-07-24: qty 1

## 2022-07-24 SURGICAL SUPPLY — 34 items
ADAPTER VALVE BIOPSY EBUS (MISCELLANEOUS) IMPLANT
ADPTR VALVE BIOPSY EBUS (MISCELLANEOUS) ×1
BRUSH CYTOL CELLEBRITY 1.5X140 (MISCELLANEOUS) IMPLANT
CANISTER SUCT 3000ML PPV (MISCELLANEOUS) ×1 IMPLANT
CNTNR URN SCR LID CUP LEK RST (MISCELLANEOUS) ×1 IMPLANT
CONT SPEC 4OZ STRL OR WHT (MISCELLANEOUS) ×2
COVER BACK TABLE 60X90IN (DRAPES) ×1 IMPLANT
FORCEPS BIOP RJ4 1.8 (CUTTING FORCEPS) IMPLANT
GAUZE SPONGE 4X4 12PLY STRL (GAUZE/BANDAGES/DRESSINGS) ×1 IMPLANT
GLOVE BIO SURGEON STRL SZ7.5 (GLOVE) ×1 IMPLANT
GOWN STRL REUS W/ TWL LRG LVL3 (GOWN DISPOSABLE) ×1 IMPLANT
GOWN STRL REUS W/TWL LRG LVL3 (GOWN DISPOSABLE) ×1
KIT CLEAN ENDO COMPLIANCE (KITS) ×2 IMPLANT
KIT TURNOVER KIT B (KITS) ×1 IMPLANT
MARKER SKIN DUAL TIP RULER LAB (MISCELLANEOUS) ×1 IMPLANT
NDL ASPIRATION VIZISHOT 19G (NEEDLE) IMPLANT
NDL ASPIRATION VIZISHOT 21G (NEEDLE) IMPLANT
NEEDLE ASPIRATION VIZISHOT 19G (NEEDLE) ×1 IMPLANT
NEEDLE ASPIRATION VIZISHOT 21G (NEEDLE) IMPLANT
NS IRRIG 1000ML POUR BTL (IV SOLUTION) ×1 IMPLANT
OIL SILICONE PENTAX (PARTS (SERVICE/REPAIRS)) ×1 IMPLANT
PAD ARMBOARD 7.5X6 YLW CONV (MISCELLANEOUS) ×2 IMPLANT
SYR 20ML ECCENTRIC (SYRINGE) ×2 IMPLANT
SYR 20ML LL LF (SYRINGE) ×2 IMPLANT
SYR 50ML SLIP (SYRINGE) IMPLANT
SYR 5ML LUER SLIP (SYRINGE) ×1 IMPLANT
TOWEL GREEN STERILE FF (TOWEL DISPOSABLE) ×1 IMPLANT
TRAP SPECIMEN MUCUS 40CC (MISCELLANEOUS) IMPLANT
TUBE CONNECTING 20X1/4 (TUBING) ×2 IMPLANT
UNDERPAD 30X36 HEAVY ABSORB (UNDERPADS AND DIAPERS) ×1 IMPLANT
VALVE BIOPSY  SINGLE USE (MISCELLANEOUS) ×1
VALVE BIOPSY SINGLE USE (MISCELLANEOUS) ×1 IMPLANT
VALVE SUCTION BRONCHIO DISP (MISCELLANEOUS) ×1 IMPLANT
WATER STERILE IRR 1000ML POUR (IV SOLUTION) ×1 IMPLANT

## 2022-07-24 NOTE — Anesthesia Procedure Notes (Signed)
Procedure Name: Intubation Date/Time: 07/24/2022 10:53 AM  Performed by: Moshe Salisbury, CRNAPre-anesthesia Checklist: Patient identified, Emergency Drugs available, Suction available and Patient being monitored Patient Re-evaluated:Patient Re-evaluated prior to induction Oxygen Delivery Method: Circle System Utilized Preoxygenation: Pre-oxygenation with 100% oxygen Induction Type: IV induction Ventilation: Mask ventilation without difficulty Laryngoscope Size: Glidescope and 4 Tube type: Oral Tube size: 9.0 mm Number of attempts: 1 Airway Equipment and Method: Rigid stylet and Video-laryngoscopy Placement Confirmation: ETT inserted through vocal cords under direct vision, positive ETCO2 and breath sounds checked- equal and bilateral Secured at: 22 cm Tube secured with: Tape Dental Injury: Teeth and Oropharynx as per pre-operative assessment

## 2022-07-24 NOTE — Interval H&P Note (Signed)
History and Physical Interval Note:  07/24/2022 10:41 AM  Brett Campbell  has presented today for surgery, with the diagnosis of LEFT HILAR MASS.  The various methods of treatment have been discussed with the patient and family. After consideration of risks, benefits and other options for treatment, the patient has consented to  Procedure(s): [place] (N/A) as a surgical intervention.  The patient's history has been reviewed, patient examined, no change in status, stable for surgery.  I have reviewed the patient's chart and labs.  Questions were answered to the patient's satisfaction.     [name]

## 2022-07-24 NOTE — Transfer of Care (Signed)
Immediate Anesthesia Transfer of Care Note  Patient: Brett Campbell  Procedure(s) Performed: VIDEO BRONCHOSCOPY WITH ENDOBRONCHIAL ULTRASOUND  WITH LEFT LOWER LOBE  MASS BIOPSIES (Chest)  Patient Location: PACU  Anesthesia Type:General  Level of Consciousness: drowsy and patient cooperative  Airway & Oxygen Therapy: Patient Spontanous Breathing and Patient connected to nasal cannula oxygen  Post-op Assessment: Report given to RN and Post -op Vital signs reviewed and stable  Post vital signs: Reviewed and stable  Last Vitals:  Vitals Value Taken Time  BP 130/79 07/24/22 1153  Temp    Pulse 81 07/24/22 1155  Resp 12 07/24/22 1155  SpO2 96 % 07/24/22 1155  Vitals shown include unvalidated device data.  Last Pain:  Vitals:   07/24/22 0852  PainSc: 0-No pain         Complications: No notable events documented.

## 2022-07-24 NOTE — Discharge Instructions (Signed)
We will call you with the results of your PET scan and biopsy results

## 2022-07-24 NOTE — Op Note (Signed)
Flexible and EBUS Bronchoscopy Procedure Note  Brett Campbell  [id number]  [birth year]  Date:07/24/22  Time:11:45 AM   Provider Performing:Peggyann Zwiefelhofer B Cambelle Suchecki   Procedure: Flexible bronchoscopy and EBUS Bronchoscopy  Indication(s) Left Lower Lobe Mass  Consent Risks of the procedure as well as the alternatives and risks of each were explained to the patient and/or caregiver.  Consent for the procedure was obtained.  Anesthesia General Anesthesia   Time Out Verified patient identification, verified procedure, site/side was marked, verified correct patient position, special equipment/implants available, medications/allergies/relevant history reviewed, required imaging and test results available.   Sterile Technique Usual hand hygiene, masks, gowns, and gloves were used   Procedure Description Diagnostic bronchoscope advanced through endotracheal tube and into airway.  Airways were examined down to subsegmental level with findings noted below.  Following diagnostic evaluation, the diagnostic bronchoscope was then removed and the EBUS bronchoscope was advanced into airway with LLL biopsied and sent for slide, cell block, and/or culture.  The EBUS bronchoscope was removed and diagnostic scope re-inserted for final evaluation for bleeding at biopsy sites at LLL bronchus which was controlled with epinephrine/saline mixture. Bleeding was controlled and diagnostic scope removed concluding the procedure.  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   Complications/Tolerance None; patient tolerated the procedure well. Chest X-ray is needed post procedure.   EBL Minimal   Specimen(s) LLL biopsies for cytology slide and cell block  LLL biopsies sent for AFB and fungal cultures in saline

## 2022-07-24 NOTE — Anesthesia Preprocedure Evaluation (Addendum)
Anesthesia Evaluation  Patient identified by MRN, date of birth, ID band Patient awake    Reviewed: Allergy & Precautions, NPO status , Patient's Chart, lab work & pertinent test results  Airway Mallampati: II       Dental   Pulmonary sleep apnea , pneumonia, COPD, Current Smoker and Patient abstained from smoking.   breath sounds clear to auscultation       Cardiovascular hypertension,  Rhythm:Regular Rate:Normal     Neuro/Psych    GI/Hepatic negative GI ROS, Neg liver ROS,,,  Endo/Other  Hypothyroidism    Renal/GU negative Renal ROS     Musculoskeletal   Abdominal   Peds  Hematology   Anesthesia Other Findings   Reproductive/Obstetrics                             Anesthesia Physical Anesthesia Plan  ASA: 3  Anesthesia Plan: General   Post-op Pain Management: Tylenol PO (pre-op)*   Induction: Intravenous  PONV Risk Score and Plan: 2 and Ondansetron, Dexamethasone and Midazolam  Airway Management Planned: Oral ETT  Additional Equipment:   Intra-op Plan:   Post-operative Plan:   Informed Consent: I have reviewed the patients History and Physical, chart, labs and discussed the procedure including the risks, benefits and alternatives for the proposed anesthesia with the patient or authorized representative who has indicated his/her understanding and acceptance.     Dental advisory given  Plan Discussed with: CRNA and Anesthesiologist  Anesthesia Plan Comments:        Anesthesia Quick Evaluation

## 2022-07-24 NOTE — Anesthesia Postprocedure Evaluation (Signed)
Anesthesia Post Note  Patient: Brett Campbell  Procedure(s) Performed: VIDEO BRONCHOSCOPY WITH ENDOBRONCHIAL ULTRASOUND  WITH LEFT LOWER LOBE  MASS BIOPSIES (Chest)     Patient location during evaluation: PACU Anesthesia Type: General Level of consciousness: awake Pain management: pain level controlled Vital Signs Assessment: post-procedure vital signs reviewed and stable Respiratory status: spontaneous breathing Cardiovascular status: stable Postop Assessment: no apparent nausea or vomiting Anesthetic complications: no   No notable events documented.  Last Vitals:  Vitals:   07/24/22 1230 07/24/22 1245  BP: 136/70 135/77  Pulse: 74 70  Resp: 16 11  Temp:  36.7 C  SpO2: 92% 91%    Last Pain:  Vitals:   07/24/22 1215  PainSc: 0-No pain                 Luceal Hollibaugh

## 2022-07-25 ENCOUNTER — Encounter (HOSPITAL_COMMUNITY): Payer: Self-pay | Admitting: Pulmonary Disease

## 2022-07-26 LAB — ACID FAST SMEAR (AFB, MYCOBACTERIA): Acid Fast Smear: NEGATIVE

## 2022-07-26 NOTE — Progress Notes (Signed)
Bronchoscopy

## 2022-07-27 ENCOUNTER — Encounter (HOSPITAL_COMMUNITY)
Admission: RE | Admit: 2022-07-27 | Discharge: 2022-07-27 | Disposition: A | Discharge status: Home / Self Care | Payer: Medicare HMO | Source: Ambulatory Visit | Attending: Pulmonary Disease | Admitting: Pulmonary Disease

## 2022-07-27 DIAGNOSIS — R918 Other nonspecific abnormal finding of lung field: Secondary | ICD-10-CM | POA: Insufficient documentation

## 2022-07-27 DIAGNOSIS — C3490 Malignant neoplasm of unspecified part of unspecified bronchus or lung: Secondary | ICD-10-CM | POA: Diagnosis not present

## 2022-07-27 LAB — GLUCOSE, CAPILLARY: Glucose-Capillary: 104 mg/dL — ABNORMAL HIGH (ref 70–99)

## 2022-07-27 MED ORDER — FLUDEOXYGLUCOSE F - 18 (FDG) INJECTION
9.1100 | Freq: Once | INTRAVENOUS | Status: AC | PRN
  Administered 2022-07-27: 9.11 via INTRAVENOUS

## 2022-07-29 ENCOUNTER — Telehealth: Payer: Self-pay

## 2022-07-29 ENCOUNTER — Telehealth: Payer: Self-pay | Admitting: Pulmonary Disease

## 2022-07-29 DIAGNOSIS — C3490 Malignant neoplasm of unspecified part of unspecified bronchus or lung: Secondary | ICD-10-CM

## 2022-07-29 LAB — CYTOLOGY - NON PAP

## 2022-07-29 NOTE — Telephone Encounter (Signed)
Called pt spoke to sister she is advised to take him to the  ER to get evaluated

## 2022-07-29 NOTE — Telephone Encounter (Signed)
Pt sister called to see if its common to feel dizzy and fatigue after having a PET scan. Pt had one 07/27/22.  Please advise

## 2022-07-29 NOTE — Telephone Encounter (Signed)
I spoke with patient's sister, [name]. 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 I informed her of the PET scan results as it shows spread to the adrenal glands and pancreas.   I will order MRI brain for complete staging.  I will place referral to medical oncology.  Freda Jackson, MD [facility] Office: [phone]   See Amion for personal pager PCCM on call pager [phone] until 7pm. Please call Elink 7p-7a. [phone]

## 2022-07-30 ENCOUNTER — Telehealth: Payer: Self-pay | Admitting: Internal Medicine

## 2022-07-30 NOTE — Telephone Encounter (Signed)
Scheduled appointment per referral. Patient is aware of appointment date and time. Patient is aware to arrive 15 mins prior to appointment time and to bring updated insurance cards. Patient is aware of location.   

## 2022-07-30 NOTE — Progress Notes (Signed)
[name] Telephone:([phone]) [phone]   [phone]  CONSULT NOTE  REFERRING PHYSICIAN: Dr. Erin Fulling  REASON FOR CONSULTATION:  Extensive stage small cell lung cancer  HPI Brett Campbell is a 61 y.o. male with a past medical history significant for COPD,  sensorineural hearing loss, hemicolectomy in 2022, hypothyroidism, hypertension, and latent TB is referred to the clinic for newly diagnosed small cell lung cancer.  The patient's evaluation began on in August when he started to develop a cough.  The patient had x-rays concerning for possible pneumonia and he was treated with multiple rounds of antibiotics without any significant improvement in his condition. This led to a CT scan being performed on 06/22/2022 which showed a lobular left hilar mass extending into the left lower lobe measuring 6.5 x 4.2 x 3.5 cm with occlusion of the left lower lobe bronchus and patchy airspace disease in left lower lobe.  The scan also revealed heterogeneous adrenal nodules bilaterally and mild paraseptal emphysematous changes noted.   Dr. Erin Fulling from pulmonary medicine arranged for bronchoscopy and EBUS which was performed on 07/24/22 and the final pathology ([id number]) was consistent with small cell lung cancer.  His staging PET scan showed left lower lobe mass with postobstructive collapse/changes in the left lower lobe with hypermetabolic metastases to the left extrapleural lymph node, bilateral adrenal masses, pancreatic lesions, upper abdominal lymph nodes, and soft tissue nodules in the abdomen and abdominal wall.  There was some focal hypermetabolism in the mid esophagus without definite CT correlate.   The patient has his staging brain MRI scheduled for 08/02/22.  The patient is accompanied by his sister [name] today and his sign interpreter.   Overall, he is feeling fair.  They report he has a mild cough and fatigue.  He denies any fever, chills, or weight loss.  He  sometimes gets hot at nighttime but that is secondary to his 2 dogs sleeping on either side of him.  At baseline, he gets short of breath with certain activities but states this is not worse than normal.  Denies any chest pain or abdominal pain.  He had mild blood-tinged sputum after his bronchoscopy which has resolved.  Denies any nausea, vomiting, or constipation.  He has some on and off diarrhea since his hemicolectomy surgery last year.  He denies any headache or visual changes.  The patient's mother had heart problems and diabetes.  The patient's father had stroke and heart problems.  The patient's older sister passed away secondary to leukemia which what sounds like is possibly acute leukemia.  He also had rheumatoid arthritis.  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  The patient is not married and does not have any children.  The patient socially lives with his sister.  He has smoked 1 pack of cigarettes for approximately 40 years.  He also smokes 1 over 2-3 times per day.  Denies any other drug use.  Denies any significant alcohol use.   HPI  Past Medical History:  Diagnosis Date   Calcium deficiency    Cataract    COPD (chronic obstructive pulmonary disease) ([place])    moderate emphysema   Deaf    Hypertension    Hypothyroidism    Personal history of colonic polyps 11/03/2020   Pneumonia    August 2023   Scarlet fever    Sleep apnea    never used a Cpap, used to use  O2   Thyroid disease    Tuberculosis    [place]. Health department is contact with him due to hx.    Past Surgical History:  Procedure Laterality Date   CHOLECYSTECTOMY     COLONOSCOPY     +5years   LAPAROSCOPIC RIGHT HEMI COLECTOMY N/A 02/26/2021   Procedure: LAPAROSCOPIC RIGHT HEMI COLECTOMY;  Surgeon: Ileana Roup, MD;  Location: [facility];  Service: General;  Laterality: N/A;   THYROIDECTOMY     VIDEO BRONCHOSCOPY WITH ENDOBRONCHIAL ULTRASOUND N/A 07/24/2022    Procedure: VIDEO BRONCHOSCOPY WITH ENDOBRONCHIAL ULTRASOUND  WITH LEFT LOWER LOBE  MASS BIOPSIES;  Surgeon: [name], MD;  Location: [facility];  Service: Pulmonary;  Laterality: N/A;    Family History  Problem Relation Age of Onset   CAD Mother    Diabetes Mellitus II Mother    Diabetes Mother    CAD Father    Heart attack Father    Heart disease Father    Diabetes Sister    Colon cancer Neg Hx    Esophageal cancer Neg Hx    Stomach cancer Neg Hx    Colon polyps Neg Hx     Social History Social History   Tobacco Use   Smoking status: Some Days    Packs/day: 0.25    Years: 45.00    Total pack years: 11.25    Types: Cigarettes   Smokeless tobacco: Never   Tobacco comments:    pt states he smokes 1 cigarette per day  Vaping Use   Vaping Use: Never used  Substance Use Topics   Alcohol use: Yes    Alcohol/week: 1.0 - 2.0 standard drink of alcohol    Types: 1 - 2 Cans of beer per week    Comment: very rare   Drug use: Yes    Types: Marijuana    Allergies  Allergen Reactions   Penicillins     Unknown  Did it involve swelling of the face/tongue/throat, SOB, or low BP? Unknown Did it involve sudden or severe rash/hives, skin peeling, or any reaction on the inside of your mouth or nose? Unknown Did you need to seek medical attention at a hospital or doctor's office? Unknown When did it last happen?     childhood  If all above answers are "NO", may proceed with cephalosporin use.      Current Outpatient Medications  Medication Sig Dispense Refill   amLODipine (NORVASC) 5 MG tablet Take 1 tablet (5 mg total) by mouth daily. 90 tablet 3   atorvastatin (LIPITOR) 10 MG tablet TAKE 1 TABLET EVERY DAY 90 tablet 3   calcitRIOL (ROCALTROL) 0.25 MCG capsule Take 1 capsule (0.25 mcg total) by mouth daily. 90 capsule 3   Calcium Carbonate-Vit D-Min (CALTRATE 600+D PLUS MINERALS) 600-800 MG-UNIT CHEW Chew 1,200 mg by mouth 2 (two) times daily. (Patient taking differently:  Chew 2 tablets by mouth 2 (two) times daily.) 360 tablet 3   colestipol (COLESTID) 1 g tablet Take 1 tablet (1 g total) by mouth 2 (two) times daily. (Patient taking differently: Take 1 g by mouth daily.) 60 tablet 5   guaifenesin (MUCUS RELIEF CHEST CONGESTION) 400 MG TABS tablet Take 400 mg by mouth every 4 (four) hours as needed (cough).     ibuprofen (ADVIL) 200 MG tablet Take 400-800 mg by mouth every 6 (six) hours as needed for moderate pain.     levothyroxine (SYNTHROID) 150 MCG tablet Take 1 tablet (150 mcg total) by mouth daily.  90 tablet 3   lisinopril (ZESTRIL) 10 MG tablet TAKE 1 TABLET EVERY DAY 90 tablet 3   potassium chloride SA (KLOR-CON M) 20 MEQ tablet TAKE 1 TABLET EVERY DAY 90 tablet 3   No current facility-administered medications for this visit.    REVIEW OF SYSTEMS:   Review of Systems  Constitutional: Today for fatigue.  Negative for appetite change, chills, fever and unexpected weight change.  HENT: Negative for mouth sores, nosebleeds, sore throat and trouble swallowing.   Eyes: Negative for eye problems and icterus.  Respiratory: Positive for mild cough.  Positive for baseline dyspnea on exertion.  Negative for hemoptysis and wheezing.   Cardiovascular: Negative for chest pain and leg swelling.  Gastrointestinal: Negative for abdominal pain, constipation, diarrhea, nausea and vomiting.  Genitourinary: Negative for bladder incontinence, difficulty urinating, dysuria, frequency and hematuria.   Musculoskeletal: Negative for back pain, gait problem, neck pain and neck stiffness.  Skin: Negative for itching and rash.  Neurological: Negative for dizziness, extremity weakness, gait problem, headaches, light-headedness and seizures.  Hematological: Negative for adenopathy. Does not bruise/bleed easily.  Psychiatric/Behavioral: Negative for confusion, depression and sleep disturbance. The patient is not nervous/anxious.     PHYSICAL EXAMINATION:  There were no vitals  taken for this visit.  ECOG PERFORMANCE STATUS: 1  Physical Exam  Constitutional: Oriented to person, place, and time and well-developed, well-nourished, and in no distress.  HENT:  Head: Patient is deaf.  Normocephalic and atraumatic.  Mouth/Throat: Oropharynx is clear and moist. No oropharyngeal exudate.  Eyes: Conjunctivae are normal. Right eye exhibits no discharge. Left eye exhibits no discharge. No scleral icterus.  Neck: Normal range of motion. Neck supple.  Cardiovascular: Normal rate, regular rhythm, normal heart sounds and intact distal pulses.   Pulmonary/Chest: Effort normal.  Mild wheezing. no respiratory distress. No rales.  Abdominal: Soft. Bowel sounds are normal. Exhibits no distension and no mass. There is no tenderness.  Musculoskeletal: Normal range of motion. Exhibits no edema.  Lymphadenopathy:    No cervical adenopathy.  Neurological: Alert and oriented to person, place, and time. Exhibits normal muscle tone. Gait normal. Coordination normal.  Skin: Skin is warm and dry. No rash noted. Not diaphoretic. No erythema. No pallor.  Psychiatric: Mood, memory and judgment normal.  Vitals reviewed.  LABORATORY DATA: Lab Results  Component Value Date   WBC 10.7 04/10/2022   HGB 14.6 07/24/2022   HCT 43.0 07/24/2022   MCV 91 04/10/2022   PLT 354 04/10/2022      Chemistry      Component Value Date/Time   NA 139 07/24/2022 0926   NA 139 04/28/2022 1038   K 3.8 07/24/2022 0926   CL 106 07/24/2022 0926   CO2 19 (L) 04/28/2022 1038   BUN 14 07/24/2022 0926   BUN 14 04/28/2022 1038   CREATININE 1.20 07/24/2022 0926      Component Value Date/Time   CALCIUM 8.9 04/28/2022 1038   ALKPHOS 47 03/28/2022 2347   AST 18 03/28/2022 2347   ALT 18 03/28/2022 2347   BILITOT 0.7 03/28/2022 2347   BILITOT 0.4 09/02/2021 0900       RADIOGRAPHIC STUDIES: NM PET Image Initial (PI) Skull Base To Thigh  Result Date: 07/28/2022 CLINICAL DATA:  Initial treatment strategy  for lung mass. EXAM: NUCLEAR MEDICINE PET SKULL BASE TO THIGH TECHNIQUE: 9.1 mCi F-18 FDG was injected intravenously. Full-ring PET imaging was performed from the skull base to thigh after the radiotracer. CT data was obtained and used  for attenuation correction and anatomic localization. Fasting blood glucose: 104 mg/dl COMPARISON:  CT chest 06/19/2022. FINDINGS: Mediastinal blood pool activity: SUV max 3.3 Liver activity: SUV max NA NECK: No abnormal hypermetabolism. Incidental CT findings: None. CHEST: Masslike consolidation in the left lower lobe, better measured on 06/19/2022, SUV max 13.2. Focal hypermetabolism in the mid esophagus, just below the level of the carina, SUV max 7.5. No definite CT correlate. Focal intercostal uptake between the posterior left second third ribs, SUV max 4.3, corresponding to an 8 mm extrapleural lymph node (4/51). Small left axillary lymph nodes do not show metabolism above blood pool. Incidental CT findings: Thyroidectomy. Atherosclerotic calcification of the aorta and coronary arteries. Heart is at the upper limits of normal in size. Obstructed left lower lobe bronchus with new collapse/consolidation in the left lower lobe. Centrilobular and paraseptal emphysema. No pericardial effusion. Small left pleural effusion. ABDOMEN/PELVIS: Hypermetabolic bilateral adrenal masses. Index left adrenal mass measures 3.6 x 4.2 cm, SUV max 12.2. Nodule hypermetabolism throughout the pancreas. Index nodule in the body of the pancreas, SUV max 10.9. Correlation with CT is challenging without IV or oral contrast. Hypermetabolic nodules are seen in the oblique abdominal wall musculature of the left lower quadrant and lateral to the descending colon with index 11 mm nodule (4/124), SUV max 7.2. Small upper abdominal lymph nodes are hypermetabolic with index gastrohepatic ligament lymph node measuring 7 mm (4/107), SUV max 10.9. No abnormal hypermetabolism in the liver or spleen. Incidental CT  findings: Liver is unremarkable. Cholecystectomy. Bilateral adrenal masses are discussed above. Subcentimeter lesion in the right kidney, too small to characterize. Kidneys, spleen, pancreas and stomach are otherwise unremarkable with the exception of a small hiatal hernia. Right hemicolectomy. Atherosclerotic calcification of the aorta. SKELETON: No abnormal hypermetabolism. Incidental CT findings: Degenerative changes in the spine. IMPRESSION: 1. Stage IV primary bronchogenic carcinoma as evidenced by a hypermetabolic obstructing left lower lobe mass with postobstructive collapse/consolidation in the left lower lobe with the following hypermetabolic metastases: Left extrapleural lymph node, bilateral adrenal masses, pancreatic lesions, upper abdominal lymph nodes and soft tissue nodules in the abdomen and abdominal wall. 2. Focal hypermetabolism in the mid esophagus, without a definite CT correlate. Consider esophagram in further evaluation, as clinically indicated, as malignancy cannot be excluded. 3. Small left pleural effusion. 4. Aortic atherosclerosis (ICD10-I70.0). Coronary artery calcification. 5.  Emphysema (ICD10-J43.9). Electronically Signed   By: Lorin Picket M.D.   On: 07/28/2022 14:10   DG CHEST PORT 1 VIEW  Result Date: 07/24/2022 CLINICAL DATA:  Status post bronchoscopy and biopsy EXAM: PORTABLE CHEST 1 VIEW COMPARISON:  In 06/03/2022, 06/19/2022 FINDINGS: Normal heart size. Known left hilar mass, better seen by recent CT. No new airspace consolidation. No pleural effusion or pneumothorax. IMPRESSION: No pneumothorax following bronchoscopy. Electronically Signed   By: Davina Poke D.O.   On: 07/24/2022 12:35    ASSESSMENT: This is a very pleasant 61 year old Caucasian male diagnosed with extensive stage (T4, N3, M1c) small cell lung cancer.  The patient presented with a obstructing left lower lobe mass with left extrapleural lymph node, bilateral adrenal masses, pancreatic lesions,  upper abdominal lymph nodes, and soft tissue nodules in the abdomen and abdominal wall.  The patient was diagnosed in December 2023.  The patient was seen with Dr. Julien Nordmann today.  Dr. Julien Nordmann had a lengthy discussion with the patient today about his current condition and recommended treatment options.  Dr. Julien Nordmann discussed that treatment would be palliative in nature.  The patient was  given the option of palliative care/hospice versus starting palliative systemic chemotherapy with carboplatin for an AUC of 5 on day 1, etoposide 100 mg/m on days 1, 2, and 3, and Imfinzi 1500 mg IV every 3 weeks.  The patient is interested in this option and he is expected to receive his first dose of treatment on 08/10/22.   The adverse side effects of treatment were discussed including but not limited to myelosuppression, nausea, vomiting, alopecia, risk of infection, fatigue, kidney, and liver dysfunction.  I discussed with her the adverse effect of the immunotherapy including but not limited to immunotherapy mediated skin rash, diarrhea, inflammation of the lung, kidney, liver, thyroid or other endocrine dysfunction  We will arrange for chemo education class prior to receiving his first infusion.  The patient is scheduled for a brain MRI on 08/02/22.  He was instructed to keep this appointment as scheduled.  I sent a prescription for Compazine 10 mg p.o. every 6 hours as needed to the patient's pharmacy for nausea and vomiting.  We will see the patient back for follow-up visit in approximately 2.5 weeks for evaluation and a 1 week follow-up visit to manage any adverse side effects of treatment.  I sent a prescription for Emla cream to the patient's pharmacy.  I placed a referral for a Port-A-Cath placement.  I have placed a referral to a Education officer, museum as he needs assistance with transportation.  The patient voices understanding of current disease status and treatment options and is in agreement with the  current care plan.  All questions were answered. The patient knows to call the clinic with any problems, questions or concerns. We can certainly see the patient much sooner if necessary.  Thank you so much for allowing me to participate in the care of [place]. I will continue to follow up the patient with you and assist in his care.   Disclaimer: This note was dictated with voice recognition software. Similar sounding words can inadvertently be transcribed and may not be corrected upon review.   Brayln Duque L Nitasha Jewel July 30, 2022, 5:37 PM  ADDENDUM: Hematology/Oncology Attending: I had a face-to-face encounter with the patient today.  I reviewed his record, lab, scan and recommended his care plan.  This is a very pleasant 61 years old the white male with history of COPD, sensorineural hearing loss, hemicolectomy in 2022, hypothyroidism, hypertension, history of latent TB.  The patient was complaining of cough and August 2023 and had chest x-ray at that time that was suspicious for pneumonia treated with multiple courses of antibiotics with no improvement.  He finally had CT scan of the chest on June 22, 2022 and that showed lobular left hilar mass extending into the left lower lobe measuring 6.4 x 4.2 x 3.5 cm with occlusion of the left lower lobe bronchus and patchy airspace disease in the left lower lobe.  The scan also showed heterogeneous adrenal nodules bilaterally and mild paraseptal emphysematous changes.  He was seen by Dr. Erin Fulling from pulmonary medicine and underwent bronchoscopy with EBUS and biopsy on July 24, 2022 and the final pathology was consistent with a small cell carcinoma.  He also had a PET scan for staging of his disease on July 27, 2022 and that showed stage IV primary bronchogenic carcinoma presented with hypermetabolic obstructive left lower lobe mass with postobstructive collapse/consolidation in the left lower lobe with hypermetabolic  metastasis involving the left extrapleural lymph node, bilateral adrenal masses, pancreatic lesions, upper abdominal lymph nodes as  well as soft tissue nodules in the abdomen and abdominal wall.  Is also focal hypermetabolism in the mid esophagus without definite CT correlate.  The patient also has a small left pleural effusion. He was referred to [place] today for evaluation and recommendation regarding treatment of his condition. I had a lengthy discussion with the patient and his sister today about his current disease stage, prognosis and treatment options. The patient has extensive stage (T3, N0, M1 C) small cell lung cancer presented with left lower lobe lung mass in addition to left extrapleural lymph nodes and bilateral adrenal metastasis in addition to metastatic lesion to the pancreas, upper abdominal lymph nodes as well as soft tissue nodules in the abdomen and abdominal wall diagnosed in December 2023. He is scheduled for MRI of the brain this weekend to complete the staging workup. I explained to the patient that he has incurable condition and all the treatment will be of palliative nature.  I discussed with him the prognosis with and without the treatment.  He understands that even with the treatment his median overall survival is around 12-13 months and without treatment usually around 3 months.  I gave the patient the option of palliative care and hospice referral versus consideration of palliative systemic chemotherapy with carboplatin for AUC of 5 on day 1, etoposide 100 Mg/M2 on days 1, 2 and 3 with Cosela 240 Mg/M2 on the days of the chemotherapy as well as Imfinzi 1500 Mg IV on day 1 every 3 weeks during the chemotherapy and then every 4 weeks during the maintenance phase. The patient and his sister are interested in proceeding with the treatment. I discussed with him the adverse effect of this treatment including but not limited to alopecia, myelosuppression, nausea and vomiting, peripheral  neuropathy, liver or renal dysfunction as well as immunotherapy adverse effects. Will arrange for the patient to have a Port-A-Cath placed for his treatment. He will have a chemotherapy education class before the first dose of his chemotherapy. The patient is expected to start the first cycle of this treatment next week. Will call his pharmacy with prescription for Compazine 10 mg p.o. every 6 hours as needed for nausea in addition to Emla cream to be applied to the Port-A-Cath site before treatment. The patient will come back for follow-up visit 1 week after his treatment The patient was advised to call immediately if he has any other concerning symptoms in the interval. The total time spent in the appointment was 90 minutes. Disclaimer: This note was dictated with voice recognition software. Similar sounding words can inadvertently be transcribed and may be missed upon review. Eilleen Kempf, MD

## 2022-07-31 ENCOUNTER — Inpatient Hospital Stay: Payer: Medicare HMO | Admitting: Licensed Clinical Social Worker

## 2022-07-31 ENCOUNTER — Inpatient Hospital Stay: Payer: Medicare HMO | Attending: Physician Assistant | Admitting: Physician Assistant

## 2022-07-31 VITALS — BP 134/79 | HR 84 | Temp 97.7°F | Resp 18 | Wt 175.2 lb

## 2022-07-31 DIAGNOSIS — E279 Disorder of adrenal gland, unspecified: Secondary | ICD-10-CM

## 2022-07-31 DIAGNOSIS — Z7189 Other specified counseling: Secondary | ICD-10-CM

## 2022-07-31 DIAGNOSIS — I10 Essential (primary) hypertension: Secondary | ICD-10-CM | POA: Diagnosis not present

## 2022-07-31 DIAGNOSIS — C3432 Malignant neoplasm of lower lobe, left bronchus or lung: Secondary | ICD-10-CM

## 2022-07-31 DIAGNOSIS — Z5112 Encounter for antineoplastic immunotherapy: Secondary | ICD-10-CM | POA: Insufficient documentation

## 2022-07-31 DIAGNOSIS — Z5111 Encounter for antineoplastic chemotherapy: Secondary | ICD-10-CM | POA: Insufficient documentation

## 2022-07-31 DIAGNOSIS — Z79899 Other long term (current) drug therapy: Secondary | ICD-10-CM | POA: Insufficient documentation

## 2022-07-31 DIAGNOSIS — C3412 Malignant neoplasm of upper lobe, left bronchus or lung: Secondary | ICD-10-CM | POA: Insufficient documentation

## 2022-07-31 DIAGNOSIS — C3490 Malignant neoplasm of unspecified part of unspecified bronchus or lung: Secondary | ICD-10-CM | POA: Insufficient documentation

## 2022-07-31 DIAGNOSIS — C779 Secondary and unspecified malignant neoplasm of lymph node, unspecified: Secondary | ICD-10-CM | POA: Insufficient documentation

## 2022-07-31 DIAGNOSIS — F17210 Nicotine dependence, cigarettes, uncomplicated: Secondary | ICD-10-CM | POA: Diagnosis not present

## 2022-07-31 MED ORDER — PROCHLORPERAZINE MALEATE 10 MG PO TABS: 10.0000 mg | ORAL_TABLET | Freq: Four times a day (QID) | ORAL | 2 refills | Status: DC | PRN

## 2022-07-31 MED ORDER — LIDOCAINE-PRILOCAINE 2.5-2.5 % EX CREA: 1.0000 | TOPICAL_CREAM | CUTANEOUS | 2 refills | Status: DC | PRN

## 2022-07-31 NOTE — Progress Notes (Signed)
[facility] [place] Work  Initial Assessment   Brett Campbell is a 61 y.o. year old male contacted caregiver by phone. Clinical Social Work was referred by medical provider for assessment of psychosocial needs.   SDOH (Social Determinants of Health) assessments performed: Yes SDOH Interventions    Flowsheet Row Care Coordination from 03/31/2022 in [facility] Interventions   Housing Interventions Intervention Not Indicated  Transportation Interventions Intervention Not Indicated       [place]: Low Risk  (03/31/2022)  Transportation Needs: No Transportation Needs (03/31/2022)  Depression (PHQ2-9): Low Risk  (04/07/2022)  Tobacco Use: High Risk (07/25/2022)     Distress Screen completed: No     No data to display            Family/Social Information:  Housing Arrangement: patient lives with his sister Brett Campbell [phone]).  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   Pt's sister assists w/ doctor's visits and manages pt's affairs. Family members/support persons in your life? Pt's sister has 3 adult children who assist as they are able Transportation concerns: Pt will need transportation as his sister is not able to drive pt and her children work full time, so they are also not available to drive pt to appointments  Employment: Legally disabled  Income source: [place] concerns: Yes, current concerns Type of concern: Utilities, Transportation, and Medical bills Food access concerns: no Religious or spiritual practice: Not known Services Currently in place:  none  Coping/ Adjustment to diagnosis: Patient understands treatment plan and what happens next? yes Concerns about diagnosis and/or treatment: How I will pay for the services I need, How will I care for myself, and Quality of life Patient reported stressors: Conservation officer, nature and/or priorities: Pt's priority is to start treatment w/ the hope of a positive outcome Patient enjoys  not discussed Current coping skills/ strengths: Supportive family/friends     SUMMARY: Current SDOH Barriers:  Financial constraints related to medical bills and Transportation  Clinical Social Work Clinical Goal(s):  Explore community resource options for unmet needs related to:  Curator Strain   Interventions: Discussed common feeling and emotions when being diagnosed with cancer, and the importance of support during treatment Informed patient of the support team roles and support services at [facility] Provided CSW contact information and encouraged patient to call with any questions or concerns Referred patient to transportation department to book transportation for treatment w/ the request that pt's sister be able to escort pt.  Referred to financial counselor to apply for the [place]   Follow Up Plan: Patient will contact CSW with any support or resource needs Patient verbalizes understanding of plan: Yes    Henriette Combs, LCSW

## 2022-07-31 NOTE — Patient Instructions (Addendum)
-  There are two main categories of lung cancer, they are named based on the size of the cancer cell. One is called Non-Small cell lung cancer. The other type is Small Cell Lung Cancer -The sample (biopsy) that they took of your tumor was consistent with Small Cell Lung Cancer -We covered a lot of important information at your appointment today regarding what the treatment plan is moving forward. Here are the the main points that were discussed at your office visit with [place] today:  -The treatment that you will receive consists of Two chemotherapy drugs, called Carboplatin and Etoposide and One Immunotherapy drug called Imfinzi (Durvalumab)  -We are planning on starting your treatment next week on 08/10/22 but before your start your treatment, I would like you to attend a Chemotherapy Education Class. This involves having you sit down with one of our nurse educators. She will discuss with your one-on-one more details about your treatment as well as general information about resources here at the [facility] treatment will be given for three consecutive days every 3 weeks. We will check your labs once a week for the first ~4 treatments just to make sure that important components of your blood are in an acceptable range -We will get a CT scan after 2 treatments to check on the progress of treatment  Medications:  -I have sent a few important medication prescriptions to your pharmacy.  -Compazine was sent to your pharmacy. This medication is for nausea. You may take this every 6 hours as needed if you feel nausous. .   Side Effects:  -The adverse effect of this treatment including but not limited to alopecia (losing your hair), myelosuppression (drops in the blood counts), nausea and vomiting, peripheral neuropathy (numbness and tingling in the hands and feet), liver or renal dysfunction.   Referrals:   Imaging:  -Please keep the appointment for the brain MRI on Sunday  Follow up:  -We will  see you back for a follow up visit in 2.5 weeks for a one week follow up.

## 2022-07-31 NOTE — Progress Notes (Signed)
START ON PATHWAY REGIMEN - Small Cell Lung     Cycles 1 through 4: A cycle is every 21 days:     Durvalumab      Carboplatin      Etoposide    Cycles 5 and beyond: A cycle is every 28 days:     Durvalumab   **Always confirm dose/schedule in your pharmacy ordering system**  Patient Characteristics: Newly Diagnosed, Preoperative or Nonsurgical Candidate (Clinical Staging), First Line, Extensive Stage Therapeutic Status: Newly Diagnosed, Preoperative or Nonsurgical Candidate (Clinical Staging) AJCC T Category: cT4 AJCC N Category: cN0 AJCC M Category: cM1c AJCC 8 Stage Grouping: IVB Stage Classification: Extensive  Intent of Therapy: Non-Curative / Palliative Intent, Discussed with Patient

## 2022-08-02 ENCOUNTER — Ambulatory Visit (HOSPITAL_COMMUNITY)
Admission: RE | Admit: 2022-08-02 | Discharge: 2022-08-02 | Disposition: A | Discharge status: Home / Self Care | Payer: Medicare HMO | Source: Ambulatory Visit | Attending: Pulmonary Disease | Admitting: Pulmonary Disease

## 2022-08-02 ENCOUNTER — Other Ambulatory Visit: Payer: Self-pay

## 2022-08-02 DIAGNOSIS — G9389 Other specified disorders of brain: Secondary | ICD-10-CM | POA: Diagnosis not present

## 2022-08-02 DIAGNOSIS — C3490 Malignant neoplasm of unspecified part of unspecified bronchus or lung: Secondary | ICD-10-CM | POA: Diagnosis not present

## 2022-08-02 MED ORDER — GADOBUTROL 1 MMOL/ML IV SOLN
8.0000 mL | Freq: Once | INTRAVENOUS | Status: AC | PRN
  Administered 2022-08-02: 8 mL via INTRAVENOUS

## 2022-08-03 ENCOUNTER — Telehealth: Payer: Self-pay | Admitting: Pulmonary Disease

## 2022-08-03 NOTE — Telephone Encounter (Signed)
Called pt's sister to confirm pt's appt tomorrow 12/11 and she said they would be there. Nothing further needed.

## 2022-08-03 NOTE — Telephone Encounter (Signed)
Please review and advise on following call report:   CLINICAL DATA:  Small cell lung cancer.   EXAM: MRI HEAD WITHOUT AND WITH CONTRAST   TECHNIQUE: Multiplanar, multiecho pulse sequences of the brain and surrounding structures were obtained without and with intravenous contrast.   CONTRAST:  84mL GADAVIST GADOBUTROL 1 MMOL/ML IV SOLN   COMPARISON:  None Available.   FINDINGS: Brain: Numerous enhancing brain lesions consistent with metastatic disease. Many are fairly subtle on postcontrast imaging, although marked on series 16, and best seen by diffusion. At least 22 lesions are present. The largest is along the high left frontal cortex and measures 18 mm. A notable lesion without edema is seen in the right cerebral peduncle. Limited brain edema at metastatic disease.   Prominent mineralization in the deep cerebellum from uncertain insult, no excessive mineralization in the basal ganglia.   There are enlarged endolymphatic sacs correlating with history of deafness.   No acute infarct, hydrocephalus, or collection.   Vascular: Normal flow voids.   Skull and upper cervical spine: Normal marrow signal.   Sinuses/Orbits: Negative.   These results will be called to the ordering clinician or representative by the Radiologist Assistant, and communication documented in the PACS or [employer].   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 The largest measures 18 mm along the left frontal cortex. No worrisome mass effect.   Pt is scheduled for OV with Dr. Erin Fulling on 08/04/22. Beth please advise as Dr. Erin Fulling is unavailable today.

## 2022-08-03 NOTE — Telephone Encounter (Signed)
Nothing to be done right now, please just call and make sure patient will be coming to visit tomorrow to speak with Dr. Erin Fulling to review findings

## 2022-08-04 ENCOUNTER — Ambulatory Visit: Payer: Medicare HMO | Admitting: Pulmonary Disease

## 2022-08-04 ENCOUNTER — Encounter: Payer: Self-pay | Admitting: Pulmonary Disease

## 2022-08-04 ENCOUNTER — Inpatient Hospital Stay: Payer: Medicare HMO

## 2022-08-04 VITALS — BP 116/66 | HR 75 | Ht 70.0 in | Wt 174.0 lb

## 2022-08-04 DIAGNOSIS — C3490 Malignant neoplasm of unspecified part of unspecified bronchus or lung: Secondary | ICD-10-CM | POA: Diagnosis not present

## 2022-08-04 NOTE — Progress Notes (Signed)
Synopsis: Referred in November 2023 for abnormal chest imaging by Thornton Park, MD  Subjective:   PATIENT ID: Brett Campbell GENDER: male DOB: 1961/08/05, MRN: [id number]  HPI  Chief Complaint  Patient presents with   Follow-up    1 mo f/u after MRI.    Brett Campbell is a 61 year old deaf male, daily smoker with history of COPD, OSA not on CPAP and previously treated latent TB by [facility] in 2021 who returns to pulmonary clinic for lung mass.  He underwent bronchoscopy 07/24/22 with EBUS and TBNA biopsies of the left lung mass which showed small cell lung cancer. He has been referred to oncology and had his first visit 07/31/22 with plans to start chemotherapy 12/18 and have port placed on 08/14/22.   PET scan shows increased uptake of the left lower lobe mass, left extrapleural lymph node, bilateral adrenal masses, pancreatic lesions, upper abdominal lymph nodes and soft tissue nodules in the adomen and abdominal wall. MRI brain shows extensive cerebral metastatic disease with at least 22 lesions, largest measuring 58mm.   He is feeling more fatigued compared to initial visit.   Initial OV 07/08/22 Patient reports developing cough in August. Chest x-rays were concerning for pneumonia so he has been treated with multiple rounds of antibiotics with no improvement in his chest x-rays. CT Chest scan obtained 06/22/22 shows lobular left hilar mass extending into the left lower lobe measuring 6.5 x 4.2 x 3.5cm with occlusion of the left lower lobe bronchus and patchy airspace disease in the left lower lobe. Heterogenous adrenal nodules bilaterally measuring up to 3.5cm. Mild paraseptal emphysematous changes noted.   Patient continues to have cough, denies mucous production or hemoptysis. He denies weight loss, night sweats, fevers or chills. His appetite remains normal.   He did not tolerate CPAP in the past. He tried an inhaler in the past and it did not help his  breathing. He will have exertional dyspnea occasionally. Denies significant issues with wheezing or chest tightness.  He is smoking 1 cigarette per day. He has a 45+ pack year smoking history. He also smokes marijuana. 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 He lives with his sister. He is deaf and communicates with sign language interpreter.  Past Medical History:  Diagnosis Date   Calcium deficiency    Cataract    COPD (chronic obstructive pulmonary disease) ([place])    moderate emphysema   Deaf    Hypertension    Hypothyroidism    Personal history of colonic polyps 11/03/2020   Pneumonia    August 2023   Scarlet fever    Sleep apnea    never used a Cpap, used to use O2   Thyroid disease    Tuberculosis    [place] Health department is contact with him due to hx.     Family History  Problem Relation Age of Onset   CAD Mother    Diabetes Mellitus II Mother    Diabetes Mother    CAD Father    Heart attack Father    Heart disease Father    Diabetes Sister    Colon cancer Neg Hx    Esophageal cancer Neg Hx    Stomach cancer Neg Hx    Colon polyps Neg Hx      Social History   Socioeconomic History   Marital status: Single    Spouse name: Not on  file   Number of children: Not on file   Years of education: Not on file   Highest education level: Not on file  Occupational History   Not on file  Tobacco Use   Smoking status: Some Days    Packs/day: 0.25    Years: 45.00    Total pack years: 11.25    Types: Cigarettes   Smokeless tobacco: Never   Tobacco comments:    pt states he smokes 1 cigarette per day  Vaping Use   Vaping Use: Never used  Substance and Sexual Activity   Alcohol use: Yes    Alcohol/week: 1.0 - 2.0 standard drink of alcohol    Types: 1 - 2 Cans of beer per week    Comment: very rare   Drug use: Yes    Types: Marijuana   Sexual activity: Not Currently  Other Topics  Concern   Not on file  Social History Narrative   Not on file   Social Determinants of Health   Financial Resource Strain: High Risk (07/31/2022)   Overall Financial Resource Strain (CARDIA)    Difficulty of Paying Living Expenses: Hard  Food Insecurity: Not on file  Transportation Needs: Unmet Transportation Needs (07/31/2022)   PRAPARE - Hydrologist (Medical): Yes    Lack of Transportation (Non-Medical): Yes  Physical Activity: Not on file  Stress: Not on file  Social Connections: Not on file  Intimate Partner Violence: Not on file     Allergies  Allergen Reactions   Penicillins     Unknown  Did it involve swelling of the face/tongue/throat, SOB, or low BP? Unknown Did it involve sudden or severe rash/hives, skin peeling, or any reaction on the inside of your mouth or nose? Unknown Did you need to seek medical attention at a hospital or doctor's office? Unknown When did it last happen?     childhood  If all above answers are "NO", may proceed with cephalosporin use.       Outpatient Medications Prior to Visit  Medication Sig Dispense Refill   amLODipine (NORVASC) 5 MG tablet Take 1 tablet (5 mg total) by mouth daily. 90 tablet 3   atorvastatin (LIPITOR) 10 MG tablet TAKE 1 TABLET EVERY DAY 90 tablet 3   calcitRIOL (ROCALTROL) 0.25 MCG capsule Take 1 capsule (0.25 mcg total) by mouth daily. 90 capsule 3   Calcium Carbonate-Vit D-Min (CALTRATE 600+D PLUS MINERALS) 600-800 MG-UNIT CHEW Chew 1,200 mg by mouth 2 (two) times daily. (Patient taking differently: Chew 2 tablets by mouth 2 (two) times daily.) 360 tablet 3   colestipol (COLESTID) 1 g tablet Take 1 tablet (1 g total) by mouth 2 (two) times daily. (Patient taking differently: Take 1 g by mouth daily.) 60 tablet 5   guaifenesin (MUCUS RELIEF CHEST CONGESTION) 400 MG TABS tablet Take 400 mg by mouth every 4 (four) hours as needed (cough).     ibuprofen (ADVIL) 200 MG tablet Take 400-800 mg by  mouth every 6 (six) hours as needed for moderate pain.     levothyroxine (SYNTHROID) 150 MCG tablet Take 1 tablet (150 mcg total) by mouth daily. 90 tablet 3   lidocaine-prilocaine (EMLA) cream Apply 1 Application topically as needed. 30 g 2   lisinopril (ZESTRIL) 10 MG tablet TAKE 1 TABLET EVERY DAY 90 tablet 3   potassium chloride SA (KLOR-CON M) 20 MEQ tablet TAKE 1 TABLET EVERY DAY 90 tablet 3   prochlorperazine (COMPAZINE) 10 MG tablet Take  1 tablet (10 mg total) by mouth every 6 (six) hours as needed. 30 tablet 2   No facility-administered medications prior to visit.   Review of Systems  Constitutional:  Negative for chills, fever, malaise/fatigue and weight loss.  HENT:  Negative for congestion, sinus pain and sore throat.   Eyes: Negative.   Respiratory:  Positive for cough. Negative for hemoptysis, sputum production, shortness of breath and wheezing.   Cardiovascular:  Negative for chest pain, palpitations, orthopnea, claudication and leg swelling.  Gastrointestinal:  Negative for abdominal pain, heartburn, nausea and vomiting.  Genitourinary: Negative.   Musculoskeletal:  Negative for joint pain and myalgias.  Skin:  Negative for rash.  Neurological:  Negative for weakness.  Endo/Heme/Allergies: Negative.   Psychiatric/Behavioral: Negative.     Objective:   Vitals:   08/04/22 1336  BP: 116/66  Pulse: 75  SpO2: 97%  Weight: 174 lb (78.9 kg)  Height: 5\' 10"  (1.778 m)     Physical Exam Constitutional:      General: He is not in acute distress. HENT:     Head: Normocephalic and atraumatic.  Eyes:     Conjunctiva/sclera: Conjunctivae normal.  Cardiovascular:     Rate and Rhythm: Normal rate and regular rhythm.     Pulses: Normal pulses.     Heart sounds: Normal heart sounds. No murmur heard. Pulmonary:     Effort: Pulmonary effort is normal.     Breath sounds: Normal breath sounds.  Musculoskeletal:     Right lower leg: No edema.     Left lower leg: No edema.   Lymphadenopathy:     Cervical: No cervical adenopathy.  Skin:    General: Skin is warm and dry.  Neurological:     General: No focal deficit present.     Mental Status: He is alert.    CBC    Component Value Date/Time   WBC 10.7 04/10/2022 1014   WBC 8.6 03/30/2022 0633   RBC 4.85 04/10/2022 1014   RBC 3.99 (L) 03/30/2022 0633   HGB 14.6 07/24/2022 0926   HGB 14.6 04/10/2022 1014   HCT 43.0 07/24/2022 0926   HCT 44.0 04/10/2022 1014   PLT 354 04/10/2022 1014   MCV 91 04/10/2022 1014   MCH 30.1 04/10/2022 1014   MCH 30.6 03/30/2022 0633   MCHC 33.2 04/10/2022 1014   MCHC 35.1 03/30/2022 0633   RDW 12.0 04/10/2022 1014   LYMPHSABS 1.5 03/30/2022 0633   LYMPHSABS 2.9 09/02/2021 0900   MONOABS 0.6 03/30/2022 0633   EOSABS 0.2 03/30/2022 0633   EOSABS 0.6 (H) 09/02/2021 0900   BASOSABS 0.1 03/30/2022 0633   BASOSABS 0.1 09/02/2021 0900   Chest imaging: CT Chest w contrast 06/19/22 1. Lobular left hilar mass extending into the left lower lobe measuring 6.5 x 4.2 x 3.5 cm. Findings are suspicious for neoplasm. Consider one of the following in 3 months for both low-risk and high-risk individuals: (a) repeat chest CT, (b) follow-up PET-CT, or (c) tissue sampling. This recommendation follows the consensus statement: Guidelines for Management of Incidental Pulmonary Nodules Detected on CT Images: From the Fleischner Society 2017; Radiology 2017; 284:228-243. 2. Occlusion of the left lower lobe bronchus with patchy airspace disease in the left lower lobe, possible atelectasis or postobstructive pneumonitis. 3. Heterogeneous adrenal nodules bilaterally measuring up to 3.5 cm, concerning for metastatic disease. 4. Emphysema. 5. Aortic atherosclerosis and coronary artery calcifications.  PFT:     No data to display  Labs:  Path:  Echo:  Heart Catheterization:  Assessment & Plan:   Small cell lung cancer ([place]) - Plan: Ambulatory referral to Radiation  Oncology  Discussion: Brett Campbell is a 61 year old deaf male, daily smoker with history of COPD, OSA not on CPAP and previously treated latent TB by [facility] in 2021 who returns to pulmonary clinic for lung mass.  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   He is following with oncology and is starting chemotherapy 12/18 with port placement on 12/22. We will refer to radiation oncology given the metastatic disease to the brain.   He can use albuterol as needed for cough, wheezing or shortness of breath.   Follow up in 6 months.   Freda Jackson, MD [facility] Office: [phone]   Current Outpatient Medications:    amLODipine (NORVASC) 5 MG tablet, Take 1 tablet (5 mg total) by mouth daily., Disp: 90 tablet, Rfl: 3   atorvastatin (LIPITOR) 10 MG tablet, TAKE 1 TABLET EVERY DAY, Disp: 90 tablet, Rfl: 3   calcitRIOL (ROCALTROL) 0.25 MCG capsule, Take 1 capsule (0.25 mcg total) by mouth daily., Disp: 90 capsule, Rfl: 3   Calcium Carbonate-Vit D-Min (CALTRATE 600+D PLUS MINERALS) 600-800 MG-UNIT CHEW, Chew 1,200 mg by mouth 2 (two) times daily. (Patient taking differently: Chew 2 tablets by mouth 2 (two) times daily.), Disp: 360 tablet, Rfl: 3   colestipol (COLESTID) 1 g tablet, Take 1 tablet (1 g total) by mouth 2 (two) times daily. (Patient taking differently: Take 1 g by mouth daily.), Disp: 60 tablet, Rfl: 5   guaifenesin (MUCUS RELIEF CHEST CONGESTION) 400 MG TABS tablet, Take 400 mg by mouth every 4 (four) hours as needed (cough)., Disp: , Rfl:    ibuprofen (ADVIL) 200 MG tablet, Take 400-800 mg by mouth every 6 (six) hours as needed for moderate pain., Disp: , Rfl:    levothyroxine (SYNTHROID) 150 MCG tablet, Take 1 tablet (150 mcg total) by mouth daily., Disp: 90 tablet, Rfl: 3   lidocaine-prilocaine (EMLA) cream, Apply 1 Application topically as needed., Disp: 30  g, Rfl: 2   lisinopril (ZESTRIL) 10 MG tablet, TAKE 1 TABLET EVERY DAY, Disp: 90 tablet, Rfl: 3   potassium chloride SA (KLOR-CON M) 20 MEQ tablet, TAKE 1 TABLET EVERY DAY, Disp: 90 tablet, Rfl: 3   prochlorperazine (COMPAZINE) 10 MG tablet, Take 1 tablet (10 mg total) by mouth every 6 (six) hours as needed., Disp: 30 tablet, Rfl: 2

## 2022-08-04 NOTE — Patient Instructions (Signed)
Use albuterol inhaler as needed for cough, wheezing or shortness of breath.  Follow up with Oncology as scheduled  Follow up in 6 months

## 2022-08-05 NOTE — Progress Notes (Signed)
Radiation Oncology         [phone] ________________________________  Name: Brett Campbell        MRN: [id number]  Date of Service: 08/06/2022 DOB: 28-May-1961  CC:[name], NP  [name], MD     REFERRING PHYSICIAN: [name], MD   DIAGNOSIS: The primary encounter diagnosis was Small cell lung cancer [facility]). A diagnosis of Secondary malignant neoplasm of brain [facility]) was also pertinent to this visit.   HISTORY OF PRESENT ILLNESS: Brett Campbell is a 61 y.o. male seen at the request of Dr. Erin Fulling and Dr. 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 The patient presented with shortness of breath and cough and was diagnosed with pneumonia and treated with antibiotics in September 2023. He had repeat CXR on 06/03/22 that showed a persistent mass like area of change in the left lower lung. He had a CT chest on 06/19/22 that showed a 6.5 cm mass in the LLL and occlusion fo the LLL bronchus with atelectasis or postobstructive pneumonitis. He had bilateral adrenal masses measuring up to 3.5 cm. On 07/24/22 a bronchoscopy was performed and cytology of the LLL confirmed small cell carcinoma.  PET on 07/27/22 showed hypermetabolic uptake in his LLL mass with an SUV of 13.2, uptake in an 8 mm extrapleural node, activity in the bilateral adrenal masses, pancreas, abdominal wall nodules, and retroperitoneal adenopathy.  An MRI brain on 08/02/22 showed extensive cerebral metastatic disease with at least 22 lesions the largest measuring 18 mm in the left frontal lobe. His case was discussed in thoracic oncology conference and he is planning to begin carboplatin/etoposide on 08/10/22 with Dr. Julien Nordmann. He's seen to discuss radiotherapy options for his disease.    PREVIOUS RADIATION THERAPY: No   PAST MEDICAL HISTORY:  Past Medical History:  Diagnosis Date   Calcium deficiency    Cataract    COPD (chronic obstructive pulmonary  disease) ([place])    moderate emphysema   Deaf    Hypertension    Hypothyroidism    Personal history of colonic polyps 11/03/2020   Pneumonia    August 2023   Scarlet fever    Sleep apnea    never used a Cpap, used to use O2   Thyroid disease    Tuberculosis    [place] Health department is contact with him due to hx.       PAST SURGICAL HISTORY: Past Surgical History:  Procedure Laterality Date   CHOLECYSTECTOMY     COLONOSCOPY     +5years   LAPAROSCOPIC RIGHT HEMI COLECTOMY N/A 02/26/2021   Procedure: LAPAROSCOPIC RIGHT HEMI COLECTOMY;  Surgeon: Ileana Roup, MD;  Location: [facility];  Service: General;  Laterality: N/A;   THYROIDECTOMY     VIDEO BRONCHOSCOPY WITH ENDOBRONCHIAL ULTRASOUND N/A 07/24/2022   Procedure: VIDEO BRONCHOSCOPY WITH ENDOBRONCHIAL ULTRASOUND  WITH LEFT LOWER LOBE  MASS BIOPSIES;  Surgeon: [name], MD;  Location: [facility];  Service: Pulmonary;  Laterality: N/A;     FAMILY HISTORY:  Family History  Problem Relation Age of Onset   CAD Mother    Diabetes Mellitus II Mother    Diabetes Mother    CAD Father    Heart attack Father    Heart disease Father    Diabetes Sister    Colon cancer Neg Hx    Esophageal cancer Neg Hx    Stomach cancer Neg Hx    Colon polyps Neg Hx  SOCIAL HISTORY:  reports that he has been smoking cigarettes. He has a 11.25 pack-year smoking history. He has never used smokeless tobacco. He reports current alcohol use of about 1.0 - 2.0 standard drink of alcohol per week. He reports current drug use. Drug: Marijuana. The patient is single and lives with his sister [name]. His sister Chong [place] lives out of state and joins [place] during the visit by phone. He is deaf and communicates with ASL, and also reads lips. An ASL interpretor and sister [name] join [place] today in clinic.    ALLERGIES: Penicillins   MEDICATIONS:  Current Outpatient Medications  Medication Sig Dispense Refill   amLODipine (NORVASC) 5 MG tablet Take 1  tablet (5 mg total) by mouth daily. 90 tablet 3   atorvastatin (LIPITOR) 10 MG tablet TAKE 1 TABLET EVERY DAY 90 tablet 3   calcitRIOL (ROCALTROL) 0.25 MCG capsule Take 1 capsule (0.25 mcg total) by mouth daily. 90 capsule 3   Calcium Carbonate-Vit D-Min (CALTRATE 600+D PLUS MINERALS) 600-800 MG-UNIT CHEW Chew 1,200 mg by mouth 2 (two) times daily. (Patient taking differently: Chew 2 tablets by mouth 2 (two) times daily.) 360 tablet 3   colestipol (COLESTID) 1 g tablet Take 1 tablet (1 g total) by mouth 2 (two) times daily. (Patient taking differently: Take 1 g by mouth daily.) 60 tablet 5   guaifenesin (MUCUS RELIEF CHEST CONGESTION) 400 MG TABS tablet Take 400 mg by mouth every 4 (four) hours as needed (cough).     ibuprofen (ADVIL) 200 MG tablet Take 400-800 mg by mouth every 6 (six) hours as needed for moderate pain.     levothyroxine (SYNTHROID) 150 MCG tablet Take 1 tablet (150 mcg total) by mouth daily. 90 tablet 3   lidocaine-prilocaine (EMLA) cream Apply 1 Application topically as needed. 30 g 2   lisinopril (ZESTRIL) 10 MG tablet TAKE 1 TABLET EVERY DAY 90 tablet 3   potassium chloride SA (KLOR-CON M) 20 MEQ tablet TAKE 1 TABLET EVERY DAY 90 tablet 3   prochlorperazine (COMPAZINE) 10 MG tablet Take 1 tablet (10 mg total) by mouth every 6 (six) hours as needed. 30 tablet 2   No current facility-administered medications for this encounter.     REVIEW OF SYSTEMS: On review of systems, the patient reports that he is doing pretty well overall. He has a dry cough but no fevers, chills, productive phlegm, hemoptysis, unintended weight changes, chest pain or shortness of breath. He denies any nausea, headaches, seizures, changes in movement, vision, or speech. He does acknowledge some dizziness at night, and his sister states she has seen him stumble on one occasion but thought this was related to standing too quickly. No other complaints are communicated by the patient or family members.      PHYSICAL EXAM:  Wt Readings from Last 3 Encounters:  08/06/22 178 lb (80.7 kg)  08/04/22 174 lb (78.9 kg)  07/31/22 175 lb 4 oz (79.5 kg)   Temp Readings from Last 3 Encounters:  08/06/22 (!) 97.2 F (36.2 C) (Temporal)  07/31/22 97.7 F (36.5 C) (Oral)  07/24/22 98 F (36.7 C)   BP Readings from Last 3 Encounters:  08/06/22 138/73  08/04/22 116/66  07/31/22 134/79   Pulse Readings from Last 3 Encounters:  08/06/22 70  08/04/22 75  07/31/22 84   Pain Assessment Pain Score: 0-No pain/10  In general this is a well appearing caucasian male in no acute distress. He's alert and oriented x4 and appropriate throughout the examination.  Cardiopulmonary assessment is negative for acute distress and he exhibits normal effort.     ECOG = 1  0 - Asymptomatic (Fully active, able to carry on all predisease activities without restriction)  1 - Symptomatic but completely ambulatory (Restricted in physically strenuous activity but ambulatory and able to carry out work of a light or sedentary nature. For example, light housework, office work)  2 - Symptomatic, <50% in bed during the day (Ambulatory and capable of all self care but unable to carry out any work activities. Up and about more than 50% of waking hours)  3 - Symptomatic, >50% in bed, but not bedbound (Capable of only limited self-care, confined to bed or chair 50% or more of waking hours)  4 - Bedbound (Completely disabled. Cannot carry on any self-care. Totally confined to bed or chair)  5 - Death   Eustace Pen MM, Creech RH, Tormey DC, et al. [phone]). "Toxicity and response criteria of the [facility]". [facility]col. 5 (6): 649-55    LABORATORY DATA:  Lab Results  Component Value Date   WBC 10.7 04/10/2022   HGB 14.6 07/24/2022   HCT 43.0 07/24/2022   MCV 91 04/10/2022   PLT 354 04/10/2022   Lab Results  Component Value Date   NA 139 07/24/2022   K 3.8 07/24/2022   CL 106 07/24/2022    CO2 19 (L) 04/28/2022   Lab Results  Component Value Date   ALT 18 03/28/2022   AST 18 03/28/2022   ALKPHOS 47 03/28/2022   BILITOT 0.7 03/28/2022      RADIOGRAPHY: MR BRAIN W WO CONTRAST  Result Date: 08/02/2022 CLINICAL DATA:  Small cell lung cancer. EXAM: MRI HEAD WITHOUT AND WITH CONTRAST TECHNIQUE: Multiplanar, multiecho pulse sequences of the brain and surrounding structures were obtained without and with intravenous contrast. CONTRAST:  49mL GADAVIST GADOBUTROL 1 MMOL/ML IV SOLN COMPARISON:  None Available. FINDINGS: Brain: Numerous enhancing brain lesions consistent with metastatic disease. Many are fairly subtle on postcontrast imaging, although marked on series 16, and best seen by diffusion. At least 22 lesions are present. The largest is along the high left frontal cortex and measures 18 mm. A notable lesion without edema is seen in the right cerebral peduncle. Limited brain edema at metastatic disease. Prominent mineralization in the deep cerebellum from uncertain insult, no excessive mineralization in the basal ganglia. There are enlarged endolymphatic sacs correlating with history of deafness. No acute infarct, hydrocephalus, or collection. Vascular: Normal flow voids. Skull and upper cervical spine: Normal marrow signal. Sinuses/Orbits: Negative. These results will be called to the ordering clinician or representative by the Radiologist Assistant, and communication documented in the PACS or [employer]. 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 The largest measures 18 mm along the left frontal cortex. No worrisome mass effect. Electronically Signed   By: Jorje Guild M.D.   On: 08/02/2022 10:20   NM PET Image Initial (PI) Skull Base To Thigh  Result Date: 07/28/2022 CLINICAL DATA:  Initial treatment strategy for lung mass. EXAM: NUCLEAR MEDICINE PET SKULL BASE TO THIGH TECHNIQUE: 9.1 mCi F-18 FDG was injected intravenously. Full-ring PET  imaging was performed from the skull base to thigh after the radiotracer. CT data was obtained and used for attenuation correction and anatomic localization. Fasting blood glucose: 104 mg/dl COMPARISON:  CT chest 06/19/2022. FINDINGS: Mediastinal blood pool activity: SUV max 3.3 Liver activity: SUV max NA NECK: No abnormal hypermetabolism. Incidental CT findings: None.  CHEST: Masslike consolidation in the left lower lobe, better measured on 06/19/2022, SUV max 13.2. Focal hypermetabolism in the mid esophagus, just below the level of the carina, SUV max 7.5. No definite CT correlate. Focal intercostal uptake between the posterior left second third ribs, SUV max 4.3, corresponding to an 8 mm extrapleural lymph node (4/51). Small left axillary lymph nodes do not show metabolism above blood pool. Incidental CT findings: Thyroidectomy. Atherosclerotic calcification of the aorta and coronary arteries. Heart is at the upper limits of normal in size. Obstructed left lower lobe bronchus with new collapse/consolidation in the left lower lobe. Centrilobular and paraseptal emphysema. No pericardial effusion. Small left pleural effusion. ABDOMEN/PELVIS: Hypermetabolic bilateral adrenal masses. Index left adrenal mass measures 3.6 x 4.2 cm, SUV max 12.2. Nodule hypermetabolism throughout the pancreas. Index nodule in the body of the pancreas, SUV max 10.9. Correlation with CT is challenging without IV or oral contrast. Hypermetabolic nodules are seen in the oblique abdominal wall musculature of the left lower quadrant and lateral to the descending colon with index 11 mm nodule (4/124), SUV max 7.2. Small upper abdominal lymph nodes are hypermetabolic with index gastrohepatic ligament lymph node measuring 7 mm (4/107), SUV max 10.9. No abnormal hypermetabolism in the liver or spleen. Incidental CT findings: Liver is unremarkable. Cholecystectomy. Bilateral adrenal masses are discussed above. Subcentimeter lesion in the right  kidney, too small to characterize. Kidneys, spleen, pancreas and stomach are otherwise unremarkable with the exception of a small hiatal hernia. Right hemicolectomy. Atherosclerotic calcification of the aorta. SKELETON: No abnormal hypermetabolism. Incidental CT findings: Degenerative changes in the spine. IMPRESSION: 1. Stage IV primary bronchogenic carcinoma as evidenced by a hypermetabolic obstructing left lower lobe mass with postobstructive collapse/consolidation in the left lower lobe with the following hypermetabolic metastases: Left extrapleural lymph node, bilateral adrenal masses, pancreatic lesions, upper abdominal lymph nodes and soft tissue nodules in the abdomen and abdominal wall. 2. Focal hypermetabolism in the mid esophagus, without a definite CT correlate. Consider esophagram in further evaluation, as clinically indicated, as malignancy cannot be excluded. 3. Small left pleural effusion. 4. Aortic atherosclerosis (ICD10-I70.0). Coronary artery calcification. 5.  Emphysema (ICD10-J43.9). Electronically Signed   By: Lorin Picket M.D.   On: 07/28/2022 14:10   DG CHEST PORT 1 VIEW  Result Date: 07/24/2022 CLINICAL DATA:  Status post bronchoscopy and biopsy EXAM: PORTABLE CHEST 1 VIEW COMPARISON:  In 06/03/2022, 06/19/2022 FINDINGS: Normal heart size. Known left hilar mass, better seen by recent CT. No new airspace consolidation. No pleural effusion or pneumothorax. IMPRESSION: No pneumothorax following bronchoscopy. Electronically Signed   By: Davina Poke D.O.   On: 07/24/2022 12:35       IMPRESSION/PLAN: 1. 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 Dr. Lisbeth Renshaw discusses the pathology findings and reviews the nature of extensive stage small cell lung cancer. He reviews the discussion from thoracic oncology conference to proceed with 2 cycles of carboplatin/etoposide followed by a break from chemotherapy to then proceed with whole brain  radiotherapy. And following radiation resume systemic treatment. We discussed the rationale for repeat MRI prior to radiotherapy for a baseline comparison to future follow up as well. We discussed the risks, benefits, short, and long term effects of radiotherapy, as well as the palliative intent, and the patient is interested in proceeding at the appropriate time. He would like to delay consent until closer to his radiation treatment. We will plan to proceed with repeat MRI the week of 09/14/22,  simulate the same week, and start on 09/21/22. We will confirm this as well with Dr. Julien Nordmann. As he does not seem to have significant neurologic symptoms we would avoid steroids but could revisit this if needed. He also seems to be doing well with his respiratory status but palliative radiotherapy could be considered if needed in the future.    In a visit lasting 75 minutes, greater than 50% of the time was spent face to face discussing the patient's condition, in preparation for the discussion, and coordinating the patient's care including the assistance of the ASL interpreter.  The above documentation reflects my direct findings during this shared patient visit. Please see the separate note by Dr. Lisbeth Renshaw on this date for the remainder of the patient's plan of care.    Carola Rhine, [facility]   **Disclaimer: This note was dictated with voice recognition software. Similar sounding words can inadvertently be transcribed and this note may contain transcription errors which may not have been corrected upon publication of note.**

## 2022-08-05 NOTE — Progress Notes (Signed)
Thoracic Location of Tumor / Histology: Left Hilar Lung Mass, LLL occlusion  Patient presented with worsening cough in August 2023.  He was seen and a chest x ray was obtained which was concerning for pneumonia.  He was treated with multiple rounds of antibiotics with no improvement.  MRI Brain 08/02/2022: Extensive cerebral metastatic disease with at least 22 lesions. The largest measures 18 mm along the left frontal cortex.  A notable lesion without edema is seen in the right cerebral peduncle. Limited brain edema at metastatic disease.  PET 07/27/2022:   Stage IV primary bronchogenic carcinoma as evidenced by a hypermetabolic obstructing left lower lobe mass with postobstructive collapse/consolidation in the left lower lobe with the following hypermetabolic metastases: Left extrapleural lymph node, bilateral adrenal masses, pancreatic lesions, upper abdominal lymph nodes and soft tissue nodules in the abdomen and abdominal wall.  Focal hypermetabolism in the mid esophagus, without a definite CT correlate. Consider esophagram in further evaluation, as clinically indicated, as malignancy cannot be excluded.   Bronchoscopy/EBUS 07/24/2022:   CT Chest 06/22/2022: Lobular left hilar mass extending into the left lower lobe measuring 6.5 x 4.2 x 3.5 cm with occlusion of the left lower lobe bronchus and patchy airspace disease in the left lower lobe.  Heterogenous adrenal nodules bilaterally measuring up to 3.5 cm.  Mild paraseptal emphysematous changes noted.   Biopsies of Left Lower Lobe Lung 07/24/2022    Tobacco/Marijuana/Snuff/ETOH use: Current Smoker, down to 1 cigarette a day.  Past/Anticipated interventions by cardiothoracic surgery, if any:    Past/Anticipated interventions by medical oncology, if any:  Cassie Heilingoetter / Dr. Earlie Server 07/31/2022 -Dr. Julien Nordmann discussed that treatment would be palliative in nature. The patient was given the option of palliative care/hospice versus  starting palliative systemic chemotherapy.  -The patient is interested in chemotherapy and he is expected to receive his first dose of treatment on 08/10/22.     Recent neurologic symptoms, if any:  Seizures:  Headaches:  Nausea:  Dizziness/ataxia:  Difficulty with hand coordination:  Focal numbness/weakness:  Visual deficits/changes:  Confusion/Memory deficits:   Signs/Symptoms Weight changes, if any:  Respiratory complaints, if any:  Hemoptysis, if any:  Pain issues, if any:    SAFETY ISSUES: Prior radiation?  Pacemaker/ICD?   Possible current pregnancy? N/a Is the patient on methotrexate?   Current Complaints / other details:   -Port Insertion- 08/14/2022

## 2022-08-06 ENCOUNTER — Other Ambulatory Visit: Payer: Self-pay

## 2022-08-06 ENCOUNTER — Encounter: Payer: Self-pay | Admitting: Radiation Oncology

## 2022-08-06 ENCOUNTER — Ambulatory Visit
Admission: RE | Admit: 2022-08-06 | Discharge: 2022-08-06 | Disposition: A | Discharge status: Home / Self Care | Payer: Medicare HMO | Source: Ambulatory Visit | Attending: Radiation Oncology | Admitting: Radiation Oncology

## 2022-08-06 ENCOUNTER — Other Ambulatory Visit: Payer: Self-pay | Admitting: Internal Medicine

## 2022-08-06 VITALS — BP 138/73 | HR 70 | Temp 97.2°F | Resp 18 | Ht 70.0 in | Wt 178.0 lb

## 2022-08-06 DIAGNOSIS — Z86010 Personal history of colonic polyps: Secondary | ICD-10-CM | POA: Diagnosis not present

## 2022-08-06 DIAGNOSIS — C7971 Secondary malignant neoplasm of right adrenal gland: Secondary | ICD-10-CM | POA: Insufficient documentation

## 2022-08-06 DIAGNOSIS — J90 Pleural effusion, not elsewhere classified: Secondary | ICD-10-CM | POA: Diagnosis not present

## 2022-08-06 DIAGNOSIS — C7972 Secondary malignant neoplasm of left adrenal gland: Secondary | ICD-10-CM | POA: Insufficient documentation

## 2022-08-06 DIAGNOSIS — C7931 Secondary malignant neoplasm of brain: Secondary | ICD-10-CM | POA: Insufficient documentation

## 2022-08-06 DIAGNOSIS — I700 Atherosclerosis of aorta: Secondary | ICD-10-CM | POA: Insufficient documentation

## 2022-08-06 DIAGNOSIS — C3432 Malignant neoplasm of lower lobe, left bronchus or lung: Secondary | ICD-10-CM | POA: Insufficient documentation

## 2022-08-06 DIAGNOSIS — G4730 Sleep apnea, unspecified: Secondary | ICD-10-CM | POA: Insufficient documentation

## 2022-08-06 DIAGNOSIS — I2510 Atherosclerotic heart disease of native coronary artery without angina pectoris: Secondary | ICD-10-CM | POA: Insufficient documentation

## 2022-08-06 DIAGNOSIS — I10 Essential (primary) hypertension: Secondary | ICD-10-CM | POA: Insufficient documentation

## 2022-08-06 DIAGNOSIS — C3490 Malignant neoplasm of unspecified part of unspecified bronchus or lung: Secondary | ICD-10-CM

## 2022-08-06 DIAGNOSIS — F17210 Nicotine dependence, cigarettes, uncomplicated: Secondary | ICD-10-CM | POA: Diagnosis not present

## 2022-08-06 DIAGNOSIS — Z79899 Other long term (current) drug therapy: Secondary | ICD-10-CM | POA: Diagnosis not present

## 2022-08-06 DIAGNOSIS — J432 Centrilobular emphysema: Secondary | ICD-10-CM | POA: Insufficient documentation

## 2022-08-06 DIAGNOSIS — E039 Hypothyroidism, unspecified: Secondary | ICD-10-CM | POA: Insufficient documentation

## 2022-08-07 ENCOUNTER — Telehealth: Payer: Self-pay | Admitting: Internal Medicine

## 2022-08-07 ENCOUNTER — Other Ambulatory Visit: Payer: Self-pay

## 2022-08-07 MED FILL — Dexamethasone Sodium Phosphate Inj 100 MG/10ML: INTRAMUSCULAR | Qty: 1 | Status: AC

## 2022-08-07 MED FILL — Fosaprepitant Dimeglumine For IV Infusion 150 MG (Base Eq): INTRAVENOUS | Qty: 5 | Status: AC

## 2022-08-07 NOTE — Telephone Encounter (Signed)
Called patient regarding December, January and February appointments. Spoke with patient's sister, patient will be notified.

## 2022-08-07 NOTE — Progress Notes (Signed)
The proposed treatment discussed in conference is for discussion purpose only and is not a binding recommendation.  The patients have not been physically examined, or presented with their treatment options.  Therefore, final treatment plans cannot be decided.  

## 2022-08-08 ENCOUNTER — Encounter: Payer: Self-pay | Admitting: Pulmonary Disease

## 2022-08-10 ENCOUNTER — Inpatient Hospital Stay: Payer: Medicare HMO

## 2022-08-10 VITALS — BP 121/60 | HR 70 | Temp 98.0°F | Resp 18 | Wt 175.8 lb

## 2022-08-10 DIAGNOSIS — Z79899 Other long term (current) drug therapy: Secondary | ICD-10-CM | POA: Diagnosis not present

## 2022-08-10 DIAGNOSIS — C3490 Malignant neoplasm of unspecified part of unspecified bronchus or lung: Secondary | ICD-10-CM

## 2022-08-10 DIAGNOSIS — C3412 Malignant neoplasm of upper lobe, left bronchus or lung: Secondary | ICD-10-CM | POA: Diagnosis not present

## 2022-08-10 DIAGNOSIS — Z5111 Encounter for antineoplastic chemotherapy: Secondary | ICD-10-CM | POA: Diagnosis not present

## 2022-08-10 DIAGNOSIS — Z5112 Encounter for antineoplastic immunotherapy: Secondary | ICD-10-CM | POA: Diagnosis not present

## 2022-08-10 DIAGNOSIS — C779 Secondary and unspecified malignant neoplasm of lymph node, unspecified: Secondary | ICD-10-CM | POA: Diagnosis not present

## 2022-08-10 DIAGNOSIS — F17210 Nicotine dependence, cigarettes, uncomplicated: Secondary | ICD-10-CM | POA: Diagnosis not present

## 2022-08-10 LAB — CMP (CANCER CENTER ONLY)
ALT: 21 U/L (ref 0–44)
AST: 22 U/L (ref 15–41)
Albumin: 4.1 g/dL (ref 3.5–5.0)
Alkaline Phosphatase: 62 U/L (ref 38–126)
Anion gap: 9 (ref 5–15)
BUN: 28 mg/dL — ABNORMAL HIGH (ref 6–20)
CO2: 25 mmol/L (ref 22–32)
Calcium: 8.8 mg/dL — ABNORMAL LOW (ref 8.9–10.3)
Chloride: 101 mmol/L (ref 98–111)
Creatinine: 1.47 mg/dL — ABNORMAL HIGH (ref 0.61–1.24)
GFR, Estimated: 54 mL/min — ABNORMAL LOW (ref >60)
Glucose, Bld: 91 mg/dL (ref 70–99)
Potassium: 4.9 mmol/L (ref 3.5–5.1)
Sodium: 135 mmol/L (ref 135–145)
Total Bilirubin: 0.3 mg/dL (ref 0.3–1.2)
Total Protein: 6.9 g/dL (ref 6.5–8.1)

## 2022-08-10 LAB — CBC WITH DIFFERENTIAL (CANCER CENTER ONLY)
Abs Immature Granulocytes: 0.04 10*3/uL (ref 0.00–0.07)
Basophils Absolute: 0.1 10*3/uL (ref 0.0–0.1)
Basophils Relative: 1 %
Eosinophils Absolute: 0.3 10*3/uL (ref 0.0–0.5)
Eosinophils Relative: 4 %
HCT: 40.6 % (ref 39.0–52.0)
Hemoglobin: 14.1 g/dL (ref 13.0–17.0)
Immature Granulocytes: 1 %
Lymphocytes Relative: 14 %
Lymphs Abs: 1.2 10*3/uL (ref 0.7–4.0)
MCH: 29.6 pg (ref 26.0–34.0)
MCHC: 34.7 g/dL (ref 30.0–36.0)
MCV: 85.3 fL (ref 80.0–100.0)
Monocytes Absolute: 0.8 10*3/uL (ref 0.1–1.0)
Monocytes Relative: 10 %
Neutro Abs: 5.8 10*3/uL (ref 1.7–7.7)
Neutrophils Relative %: 70 %
Platelet Count: 296 10*3/uL (ref 150–400)
RBC: 4.76 MIL/uL (ref 4.22–5.81)
RDW: 11.8 % (ref 11.5–15.5)
WBC Count: 8.3 10*3/uL (ref 4.0–10.5)
nRBC: 0 % (ref 0.0–0.2)

## 2022-08-10 LAB — TSH: TSH: 0.642 u[IU]/mL (ref 0.350–4.500)

## 2022-08-10 MED ORDER — SODIUM CHLORIDE 0.9 % IV SOLN
150.0000 mg | Freq: Once | INTRAVENOUS | Status: AC
  Administered 2022-08-10: 150 mg via INTRAVENOUS
  Filled 2022-08-10: qty 150

## 2022-08-10 MED ORDER — SODIUM CHLORIDE 0.9 % IV SOLN: Freq: Once | INTRAVENOUS | Status: AC

## 2022-08-10 MED ORDER — SODIUM CHLORIDE 0.9 % IV SOLN
430.0000 mg | Freq: Once | INTRAVENOUS | Status: AC
  Administered 2022-08-10: 430 mg via INTRAVENOUS
  Filled 2022-08-10: qty 43

## 2022-08-10 MED ORDER — SODIUM CHLORIDE 0.9 % IV SOLN
10.0000 mg | Freq: Once | INTRAVENOUS | Status: AC
  Administered 2022-08-10: 10 mg via INTRAVENOUS
  Filled 2022-08-10: qty 10

## 2022-08-10 MED ORDER — SODIUM CHLORIDE 0.9 % IV SOLN
100.0000 mg/m2 | Freq: Once | INTRAVENOUS | Status: AC
  Administered 2022-08-10: 200 mg via INTRAVENOUS
  Filled 2022-08-10: qty 10

## 2022-08-10 MED ORDER — LIDOCAINE-PRILOCAINE 2.5-2.5 % EX CREA: TOPICAL_CREAM | CUTANEOUS | 3 refills | Status: DC

## 2022-08-10 MED ORDER — TRILACICLIB DIHYDROCHLORIDE INJECTION 300 MG
240.0000 mg/m2 | Freq: Once | INTRAVENOUS | Status: AC
  Administered 2022-08-10: 480 mg via INTRAVENOUS
  Filled 2022-08-10: qty 32

## 2022-08-10 MED ORDER — PALONOSETRON HCL INJECTION 0.25 MG/5ML
0.2500 mg | Freq: Once | INTRAVENOUS | Status: AC
  Administered 2022-08-10: 0.25 mg via INTRAVENOUS
  Filled 2022-08-10: qty 5

## 2022-08-10 MED ORDER — ONDANSETRON HCL 8 MG PO TABS: 8.0000 mg | ORAL_TABLET | Freq: Three times a day (TID) | ORAL | 1 refills | Status: DC | PRN

## 2022-08-10 MED ORDER — SODIUM CHLORIDE 0.9 % IV SOLN
1500.0000 mg | Freq: Once | INTRAVENOUS | Status: AC
  Administered 2022-08-10: 1500 mg via INTRAVENOUS
  Filled 2022-08-10: qty 30

## 2022-08-10 MED ORDER — PROCHLORPERAZINE MALEATE 10 MG PO TABS: 10.0000 mg | ORAL_TABLET | Freq: Four times a day (QID) | ORAL | 1 refills | Status: DC | PRN

## 2022-08-10 MED FILL — Dexamethasone Sodium Phosphate Inj 100 MG/10ML: INTRAMUSCULAR | Qty: 1 | Status: AC

## 2022-08-10 NOTE — Progress Notes (Signed)
Spoke w/ PA Cassie and we will dose patient's carboplatin based on updated serum creatinine. Calculated dose ~430 mg.  Larene Beach, PharmD

## 2022-08-10 NOTE — Patient Instructions (Signed)
[facility]  Discharge Instructions: Thank you for choosing [facility] to provide your oncology and hematology care.   If you have a lab appointment with the [facility], please go directly to the [facility] and check in at the registration area.   Wear comfortable clothing and clothing appropriate for easy access to any Portacath or PICC line.   We strive to give you quality time with your provider. You may need to reschedule your appointment if you arrive late (15 or more minutes).  Arriving late affects you and other patients whose appointments are after yours.  Also, if you miss three or more appointments without notifying the office, you may be dismissed from the clinic at the provider's discretion.      For prescription refill requests, have your pharmacy contact our office and allow 72 hours for refills to be completed. Today you received the following chemotherapy and/or immunotherapy agents imfinzi, Carboplatin, Etoposide      To help prevent nausea and vomiting after your treatment, we encourage you to take your nausea medication as directed.  BELOW ARE SYMPTOMS THAT SHOULD BE REPORTED IMMEDIATELY: *FEVER GREATER THAN 100.4 F (38 C) OR HIGHER *CHILLS OR SWEATING *NAUSEA AND VOMITING THAT IS NOT CONTROLLED WITH YOUR NAUSEA MEDICATION *UNUSUAL SHORTNESS OF BREATH *UNUSUAL BRUISING OR BLEEDING *URINARY PROBLEMS (pain or burning when urinating, or frequent urination) *BOWEL PROBLEMS (unusual diarrhea, constipation, pain near the anus) TENDERNESS IN MOUTH AND THROAT WITH OR WITHOUT PRESENCE OF ULCERS (sore throat, sores in mouth, or a toothache) UNUSUAL RASH, SWELLING OR PAIN  UNUSUAL VAGINAL DISCHARGE OR ITCHING   Items with * indicate a potential emergency and should be followed up as soon as possible or go to the Emergency Department if any problems should occur.  Please show the CHEMOTHERAPY ALERT CARD or IMMUNOTHERAPY ALERT  CARD at check-in to the Emergency Department and triage nurse.  Should you have questions after your visit or need to cancel or reschedule your appointment, please contact [facility]  Dept: [phone]  and follow the prompts.  Office hours are 8:00 a.m. to 4:30 p.m. Monday - Friday. Please note that voicemails left after 4:00 p.m. may not be returned until the following business day.  We are closed weekends and major holidays. You have access to a nurse at all times for urgent questions. Please call the main number to the clinic Dept: [phone] and follow the prompts.   For any non-urgent questions, you may also contact your provider using MyChart. We now offer e-Visits for anyone 67 and older to request care online for non-urgent symptoms. For details visit [email or web address].   Also download the MyChart app! Go to the app store, search "MyChart", open the app, select [facility], and log in with your MyChart username and password.  Masks are optional in the cancer centers. If you would like for your care team to wear a mask while they are taking care of you, please let them know. You may have one support person who is at least 60 years old accompany you for your appointments.    Durvalumab Injection What is this medication? DURVALUMAB (dur VAL ue mab) treats some types of cancer. It works by helping your immune system slow or stop the spread of cancer cells. It is a monoclonal antibody. This medicine may be used for other purposes; ask your health care provider or pharmacist if you have questions. COMMON BRAND NAME(S): IMFINZI What  should I tell my care team before I take this medication? They need to know if you have any of these conditions: Allogeneic stem cell transplant (uses someone else's stem cells) Autoimmune diseases, such as Crohn disease, ulcerative colitis, lupus History of chest radiation Nervous system problems, such as Guillain-Barre  syndrome, myasthenia gravis Organ transplant An unusual or allergic reaction to durvalumab, other medications, foods, dyes, or preservatives Pregnant or trying to get pregnant Breast-feeding How should I use this medication? This medication is infused into a vein. It is given by your care team in a hospital or clinic setting. A special MedGuide will be given to you before each treatment. Be sure to read this information carefully each time. Talk to your care team about the use of this medication in children. Special care may be needed. Overdosage: If you think you have taken too much of this medicine contact a poison control center or emergency room at once. NOTE: This medicine is only for you. Do not share this medicine with others. What if I miss a dose? Keep appointments for follow-up doses. It is important not to miss your dose. Call your care team if you are unable to keep an appointment. What may interact with this medication? Interactions have not been studied. This list may not describe all possible interactions. Give your health care provider a list of all the medicines, herbs, non-prescription drugs, or dietary supplements you use. Also tell them if you smoke, drink alcohol, or use illegal drugs. Some items may interact with your medicine. What should I watch for while using this medication? Your condition will be monitored carefully while you are receiving this medication. You may need blood work while taking this medication. This medication may cause serious skin reactions. They can happen weeks to months after starting the medication. Contact your care team right away if you notice fevers or flu-like symptoms with a rash. The rash may be red or purple and then turn into blisters or peeling of the skin. You may also notice a red rash with swelling of the face, lips, or lymph nodes in your neck or under your arms. Tell your care team right away if you have any change in your  eyesight. Talk to your care team if you may be pregnant. Serious birth defects can occur if you take this medication during pregnancy and for 3 months after the last dose. You will need a negative pregnancy test before starting this medication. Contraception is recommended while taking this medication and for 3 months after the last dose. Your care team can help you find the option that works for you. Do not breastfeed while taking this medication and for 3 months after the last dose. What side effects may I notice from receiving this medication? Side effects that you should report to your care team as soon as possible: Allergic reactions--skin rash, itching, hives, swelling of the face, lips, tongue, or throat Dry cough, shortness of breath or trouble breathing Eye pain, redness, irritation, or discharge with blurry or decreased vision Heart muscle inflammation--unusual weakness or fatigue, shortness of breath, chest pain, fast or irregular heartbeat, dizziness, swelling of the ankles, feet, or hands Hormone gland problems--headache, sensitivity to light, unusual weakness or fatigue, dizziness, fast or irregular heartbeat, increased sensitivity to cold or heat, excessive sweating, constipation, hair loss, increased thirst or amount of urine, tremors or shaking, irritability Infusion reactions--chest pain, shortness of breath or trouble breathing, feeling faint or lightheaded Kidney injury (glomerulonephritis)--decrease in the amount  of urine, red or dark brown urine, foamy or bubbly urine, swelling of the ankles, hands, or feet Liver injury--right upper belly pain, loss of appetite, nausea, light-colored stool, dark yellow or brown urine, yellowing skin or eyes, unusual weakness or fatigue Pain, tingling, or numbness in the hands or feet, muscle weakness, change in vision, confusion or trouble speaking, loss of balance or coordination, trouble walking, seizures Rash, fever, and swollen lymph  nodes Redness, blistering, peeling, or loosening of the skin, including inside the mouth Sudden or severe stomach pain, bloody diarrhea, fever, nausea, vomiting Side effects that usually do not require medical attention (report these to your care team if they continue or are bothersome): Bone, joint, or muscle pain Diarrhea Fatigue Loss of appetite Nausea Skin rash This list may not describe all possible side effects. Call your doctor for medical advice about side effects. You may report side effects to FDA at 1-800-FDA-1088. Where should I keep my medication? This medication is given in a hospital or clinic. It will not be stored at home. NOTE: This sheet is a summary. It may not cover all possible information. If you have questions about this medicine, talk to your doctor, pharmacist, or health care provider.  2023 Elsevier/Gold Standard (2021-12-01 00:00:00)       Carboplatin Injection What is this medication? CARBOPLATIN (KAR boe pla tin) treats some types of cancer. It works by slowing down the growth of cancer cells. This medicine may be used for other purposes; ask your health care provider or pharmacist if you have questions. COMMON BRAND NAME(S): Paraplatin What should I tell my care team before I take this medication? They need to know if you have any of these conditions: Blood disorders Hearing problems Kidney disease Recent or ongoing radiation therapy An unusual or allergic reaction to carboplatin, cisplatin, other medications, foods, dyes, or preservatives Pregnant or trying to get pregnant Breast-feeding How should I use this medication? This medication is injected into a vein. It is given by your care team in a hospital or clinic setting. Talk to your care team about the use of this medication in children. Special care may be needed. Overdosage: If you think you have taken too much of this medicine contact a poison control center or emergency room at once. NOTE:  This medicine is only for you. Do not share this medicine with others. What if I miss a dose? Keep appointments for follow-up doses. It is important not to miss your dose. Call your care team if you are unable to keep an appointment. What may interact with this medication? Medications for seizures Some antibiotics, such as amikacin, gentamicin, neomycin, streptomycin, tobramycin Vaccines This list may not describe all possible interactions. Give your health care provider a list of all the medicines, herbs, non-prescription drugs, or dietary supplements you use. Also tell them if you smoke, drink alcohol, or use illegal drugs. Some items may interact with your medicine. What should I watch for while using this medication? Your condition will be monitored carefully while you are receiving this medication. You may need blood work while taking this medication. This medication may make you feel generally unwell. This is not uncommon, as chemotherapy can affect healthy cells as well as cancer cells. Report any side effects. Continue your course of treatment even though you feel ill unless your care team tells you to stop. In some cases, you may be given additional medications to help with side effects. Follow all directions for their use. This medication may  increase your risk of getting an infection. Call your care team for advice if you get a fever, chills, sore throat, or other symptoms of a cold or flu. Do not treat yourself. Try to avoid being around people who are sick. Avoid taking medications that contain aspirin, acetaminophen, ibuprofen, naproxen, or ketoprofen unless instructed by your care team. These medications may hide a fever. Be careful brushing or flossing your teeth or using a toothpick because you may get an infection or bleed more easily. If you have any dental work done, tell your dentist you are receiving this medication. Talk to your care team if you wish to become pregnant or think  you might be pregnant. This medication can cause serious birth defects. Talk to your care team about effective forms of contraception. Do not breast-feed while taking this medication. What side effects may I notice from receiving this medication? Side effects that you should report to your care team as soon as possible: Allergic reactions--skin rash, itching, hives, swelling of the face, lips, tongue, or throat Infection--fever, chills, cough, sore throat, wounds that don't heal, pain or trouble when passing urine, general feeling of discomfort or being unwell Low red blood cell level--unusual weakness or fatigue, dizziness, headache, trouble breathing Pain, tingling, or numbness in the hands or feet, muscle weakness, change in vision, confusion or trouble speaking, loss of balance or coordination, trouble walking, seizures Unusual bruising or bleeding Side effects that usually do not require medical attention (report to your care team if they continue or are bothersome): Hair loss Nausea Unusual weakness or fatigue Vomiting This list may not describe all possible side effects. Call your doctor for medical advice about side effects. You may report side effects to FDA at 1-800-FDA-1088. Where should I keep my medication? This medication is given in a hospital or clinic. It will not be stored at home. NOTE: This sheet is a summary. It may not cover all possible information. If you have questions about this medicine, talk to your doctor, pharmacist, or health care provider.  2023 Elsevier/Gold Standard (2021-11-24 00:00:00)   Etoposide Injection What is this medication? ETOPOSIDE (e toe POE side) treats some types of cancer. It works by slowing down the growth of cancer cells. This medicine may be used for other purposes; ask your health care provider or pharmacist if you have questions. COMMON BRAND NAME(S): Etopophos, Toposar, VePesid What should I tell my care team before I take this  medication? They need to know if you have any of these conditions: Infection Kidney disease Liver disease Low blood counts, such as low white cell, platelet, red cell counts An unusual or allergic reaction to etoposide, other medications, foods, dyes, or preservatives If you or your partner are pregnant or trying to get pregnant Breastfeeding How should I use this medication? This medication is injected into a vein. It is given by your care team in a hospital or clinic setting. Talk to your care team about the use of this medication in children. Special care may be needed. Overdosage: If you think you have taken too much of this medicine contact a poison control center or emergency room at once. NOTE: This medicine is only for you. Do not share this medicine with others. What if I miss a dose? Keep appointments for follow-up doses. It is important not to miss your dose. Call your care team if you are unable to keep an appointment. What may interact with this medication? Warfarin This list may not describe all  possible interactions. Give your health care provider a list of all the medicines, herbs, non-prescription drugs, or dietary supplements you use. Also tell them if you smoke, drink alcohol, or use illegal drugs. Some items may interact with your medicine. What should I watch for while using this medication? Your condition will be monitored carefully while you are receiving this medication. This medication may make you feel generally unwell. This is not uncommon as chemotherapy can affect healthy cells as well as cancer cells. Report any side effects. Continue your course of treatment even though you feel ill unless your care team tells you to stop. This medication can cause serious side effects. To reduce the risk, your care team may give you other medications to take before receiving this one. Be sure to follow the directions from your care team. This medication may increase your risk of  getting an infection. Call your care team for advice if you get a fever, chills, sore throat, or other symptoms of a cold or flu. Do not treat yourself. Try to avoid being around people who are sick. This medication may increase your risk to bruise or bleed. Call your care team if you notice any unusual bleeding. Talk to your care team about your risk of cancer. You may be more at risk for certain types of cancers if you take this medication. Talk to your care team if you may be pregnant. Serious birth defects can occur if you take this medication during pregnancy and for 6 months after the last dose. You will need a negative pregnancy test before starting this medication. Contraception is recommended while taking this medication and for 6 months after the last dose. Your care team can help you find the option that works for you. If your partner can get pregnant, use a condom during sex while taking this medication and for 4 months after the last dose. Do not breastfeed while taking this medication. This medication may cause infertility. Talk to your care team if you are concerned about your fertility. What side effects may I notice from receiving this medication? Side effects that you should report to your care team as soon as possible: Allergic reactions--skin rash, itching, hives, swelling of the face, lips, tongue, or throat Infection--fever, chills, cough, sore throat, wounds that don't heal, pain or trouble when passing urine, general feeling of discomfort or being unwell Low red blood cell level--unusual weakness or fatigue, dizziness, headache, trouble breathing Unusual bruising or bleeding Side effects that usually do not require medical attention (report to your care team if they continue or are bothersome): Diarrhea Fatigue Hair loss Loss of appetite Nausea Vomiting This list may not describe all possible side effects. Call your doctor for medical advice about side effects. You may  report side effects to FDA at 1-800-FDA-1088. Where should I keep my medication? This medication is given in a hospital or clinic. It will not be stored at home. NOTE: This sheet is a summary. It may not cover all possible information. If you have questions about this medicine, talk to your doctor, pharmacist, or health care provider.  2023 Elsevier/Gold Standard (2007-10-01 00:00:00)

## 2022-08-11 ENCOUNTER — Inpatient Hospital Stay: Payer: Medicare HMO

## 2022-08-11 VITALS — BP 121/55 | HR 73 | Temp 97.8°F | Resp 18

## 2022-08-11 DIAGNOSIS — Z5112 Encounter for antineoplastic immunotherapy: Secondary | ICD-10-CM | POA: Diagnosis not present

## 2022-08-11 DIAGNOSIS — Z79899 Other long term (current) drug therapy: Secondary | ICD-10-CM | POA: Diagnosis not present

## 2022-08-11 DIAGNOSIS — C3412 Malignant neoplasm of upper lobe, left bronchus or lung: Secondary | ICD-10-CM | POA: Diagnosis not present

## 2022-08-11 DIAGNOSIS — Z5111 Encounter for antineoplastic chemotherapy: Secondary | ICD-10-CM | POA: Diagnosis not present

## 2022-08-11 DIAGNOSIS — F17210 Nicotine dependence, cigarettes, uncomplicated: Secondary | ICD-10-CM | POA: Diagnosis not present

## 2022-08-11 DIAGNOSIS — C779 Secondary and unspecified malignant neoplasm of lymph node, unspecified: Secondary | ICD-10-CM | POA: Diagnosis not present

## 2022-08-11 DIAGNOSIS — C3490 Malignant neoplasm of unspecified part of unspecified bronchus or lung: Secondary | ICD-10-CM

## 2022-08-11 LAB — T4: T4, Total: 10.6 ug/dL (ref 4.5–12.0)

## 2022-08-11 MED ORDER — SODIUM CHLORIDE 0.9 % IV SOLN: Freq: Once | INTRAVENOUS | Status: DC

## 2022-08-11 MED ORDER — TRILACICLIB DIHYDROCHLORIDE INJECTION 300 MG
240.0000 mg/m2 | Freq: Once | INTRAVENOUS | Status: AC
  Administered 2022-08-11: 480 mg via INTRAVENOUS
  Filled 2022-08-11: qty 32

## 2022-08-11 MED ORDER — SODIUM CHLORIDE 0.9% FLUSH: 10.0000 mL | INTRAVENOUS | Status: DC | PRN

## 2022-08-11 MED ORDER — SODIUM CHLORIDE 0.9 % IV SOLN
100.0000 mg/m2 | Freq: Once | INTRAVENOUS | Status: AC
  Administered 2022-08-11: 200 mg via INTRAVENOUS
  Filled 2022-08-11: qty 10

## 2022-08-11 MED ORDER — HEPARIN SOD (PORK) LOCK FLUSH 100 UNIT/ML IV SOLN: 500.0000 [IU] | Freq: Once | INTRAVENOUS | Status: DC | PRN

## 2022-08-11 MED ORDER — DEXTROSE 5 % IV SOLN: INTRAVENOUS | Status: DC

## 2022-08-11 MED ORDER — SODIUM CHLORIDE 0.9 % IV SOLN
10.0000 mg | Freq: Once | INTRAVENOUS | Status: AC
  Administered 2022-08-11: 10 mg via INTRAVENOUS
  Filled 2022-08-11: qty 10

## 2022-08-11 MED FILL — Dexamethasone Sodium Phosphate Inj 100 MG/10ML: INTRAMUSCULAR | Qty: 1 | Status: AC

## 2022-08-11 NOTE — Progress Notes (Signed)
Per Dr. Julien Nordmann ,it is okay to leave peripheral iv in for day 3 chemotherapy  treatment tomorrow.

## 2022-08-11 NOTE — Patient Instructions (Signed)
[facility]  Discharge Instructions: Thank you for choosing [facility] to provide your oncology and hematology care.   If you have a lab appointment with the [facility], please go directly to the [facility] and check in at the registration area.   Wear comfortable clothing and clothing appropriate for easy access to any Portacath or PICC line.   We strive to give you quality time with your provider. You may need to reschedule your appointment if you arrive late (15 or more minutes).  Arriving late affects you and other patients whose appointments are after yours.  Also, if you miss three or more appointments without notifying the office, you may be dismissed from the clinic at the provider's discretion.      For prescription refill requests, have your pharmacy contact our office and allow 72 hours for refills to be completed.    Today you received the following chemotherapy and/or immunotherapy agents: Etoposide, Cosela.       To help prevent nausea and vomiting after your treatment, we encourage you to take your nausea medication as directed.  BELOW ARE SYMPTOMS THAT SHOULD BE REPORTED IMMEDIATELY: *FEVER GREATER THAN 100.4 F (38 C) OR HIGHER *CHILLS OR SWEATING *NAUSEA AND VOMITING THAT IS NOT CONTROLLED WITH YOUR NAUSEA MEDICATION *UNUSUAL SHORTNESS OF BREATH *UNUSUAL BRUISING OR BLEEDING *URINARY PROBLEMS (pain or burning when urinating, or frequent urination) *BOWEL PROBLEMS (unusual diarrhea, constipation, pain near the anus) TENDERNESS IN MOUTH AND THROAT WITH OR WITHOUT PRESENCE OF ULCERS (sore throat, sores in mouth, or a toothache) UNUSUAL RASH, SWELLING OR PAIN  UNUSUAL VAGINAL DISCHARGE OR ITCHING   Items with * indicate a potential emergency and should be followed up as soon as possible or go to the Emergency Department if any problems should occur.  Please show the CHEMOTHERAPY ALERT CARD or IMMUNOTHERAPY ALERT CARD at  check-in to the Emergency Department and triage nurse.  Should you have questions after your visit or need to cancel or reschedule your appointment, please contact [facility]  Dept: [phone]  and follow the prompts.  Office hours are 8:00 a.m. to 4:30 p.m. Monday - Friday. Please note that voicemails left after 4:00 p.m. may not be returned until the following business day.  We are closed weekends and major holidays. You have access to a nurse at all times for urgent questions. Please call the main number to the clinic Dept: [phone] and follow the prompts.   For any non-urgent questions, you may also contact your provider using MyChart. We now offer e-Visits for anyone 1 and older to request care online for non-urgent symptoms. For details visit [email or web address].   Also download the MyChart app! Go to the app store, search "MyChart", open the app, select , and log in with your MyChart username and password.  Masks are optional in the cancer centers. If you would like for your care team to wear a mask while they are taking care of you, please let them know. You may have one support person who is at least 60 years old accompany you for your appointments.

## 2022-08-12 ENCOUNTER — Encounter: Payer: Self-pay | Admitting: Internal Medicine

## 2022-08-12 ENCOUNTER — Inpatient Hospital Stay: Payer: Medicare HMO

## 2022-08-12 ENCOUNTER — Other Ambulatory Visit: Payer: Self-pay | Admitting: Medical Oncology

## 2022-08-12 VITALS — BP 98/61 | HR 59 | Temp 98.0°F | Resp 20

## 2022-08-12 DIAGNOSIS — Z5111 Encounter for antineoplastic chemotherapy: Secondary | ICD-10-CM | POA: Diagnosis not present

## 2022-08-12 DIAGNOSIS — F17210 Nicotine dependence, cigarettes, uncomplicated: Secondary | ICD-10-CM | POA: Diagnosis not present

## 2022-08-12 DIAGNOSIS — Z23 Encounter for immunization: Secondary | ICD-10-CM

## 2022-08-12 DIAGNOSIS — C779 Secondary and unspecified malignant neoplasm of lymph node, unspecified: Secondary | ICD-10-CM | POA: Diagnosis not present

## 2022-08-12 DIAGNOSIS — Z5112 Encounter for antineoplastic immunotherapy: Secondary | ICD-10-CM | POA: Diagnosis not present

## 2022-08-12 DIAGNOSIS — C3490 Malignant neoplasm of unspecified part of unspecified bronchus or lung: Secondary | ICD-10-CM

## 2022-08-12 DIAGNOSIS — Z79899 Other long term (current) drug therapy: Secondary | ICD-10-CM | POA: Diagnosis not present

## 2022-08-12 DIAGNOSIS — C3412 Malignant neoplasm of upper lobe, left bronchus or lung: Secondary | ICD-10-CM | POA: Diagnosis not present

## 2022-08-12 MED ORDER — SODIUM CHLORIDE 0.9 % IV SOLN
100.0000 mg/m2 | Freq: Once | INTRAVENOUS | Status: AC
  Administered 2022-08-12: 200 mg via INTRAVENOUS
  Filled 2022-08-12: qty 10

## 2022-08-12 MED ORDER — TRILACICLIB DIHYDROCHLORIDE INJECTION 300 MG
240.0000 mg/m2 | Freq: Once | INTRAVENOUS | Status: AC
  Administered 2022-08-12: 480 mg via INTRAVENOUS
  Filled 2022-08-12: qty 32

## 2022-08-12 MED ORDER — INFLUENZA VAC SPLIT QUAD 0.5 ML IM SUSY: 0.5000 mL | PREFILLED_SYRINGE | Freq: Once | INTRAMUSCULAR | Status: DC

## 2022-08-12 MED ORDER — SODIUM CHLORIDE 0.9 % IV SOLN
10.0000 mg | Freq: Once | INTRAVENOUS | Status: AC
  Administered 2022-08-12: 10 mg via INTRAVENOUS
  Filled 2022-08-12: qty 10

## 2022-08-12 MED ORDER — SODIUM CHLORIDE 0.9 % IV SOLN: Freq: Once | INTRAVENOUS | Status: AC

## 2022-08-12 NOTE — Patient Instructions (Signed)
[facility] MEDICAL ONCOLOGY  Discharge Instructions: Thank you for choosing [facility] to provide your oncology and hematology care.   If you have a lab appointment with the Cancer Center, please go directly to the Cancer Center and check in at the registration area.   Wear comfortable clothing and clothing appropriate for easy access to any Portacath or PICC line.   We strive to give you quality time with your provider. You may need to reschedule your appointment if you arrive late (15 or more minutes).  Arriving late affects you and other patients whose appointments are after yours.  Also, if you miss three or more appointments without notifying the office, you may be dismissed from the clinic at the provider's discretion.      For prescription refill requests, have your pharmacy contact our office and allow 72 hours for refills to be completed.    Today you received the following chemotherapy and/or immunotherapy agents; Cosela & Etoposide      To help prevent nausea and vomiting after your treatment, we encourage you to take your nausea medication as directed.  BELOW ARE SYMPTOMS THAT SHOULD BE REPORTED IMMEDIATELY: *FEVER GREATER THAN 100.4 F (38 C) OR HIGHER *CHILLS OR SWEATING *NAUSEA AND VOMITING THAT IS NOT CONTROLLED WITH YOUR NAUSEA MEDICATION *UNUSUAL SHORTNESS OF BREATH *UNUSUAL BRUISING OR BLEEDING *URINARY PROBLEMS (pain or burning when urinating, or frequent urination) *BOWEL PROBLEMS (unusual diarrhea, constipation, pain near the anus) TENDERNESS IN MOUTH AND THROAT WITH OR WITHOUT PRESENCE OF ULCERS (sore throat, sores in mouth, or a toothache) UNUSUAL RASH, SWELLING OR PAIN  UNUSUAL VAGINAL DISCHARGE OR ITCHING   Items with * indicate a potential emergency and should be followed up as soon as possible or go to the Emergency Department if any problems should occur.  Please show the CHEMOTHERAPY ALERT CARD or IMMUNOTHERAPY ALERT CARD at  check-in to the Emergency Department and triage nurse.  Should you have questions after your visit or need to cancel or reschedule your appointment, please contact [facility] MEDICAL [facility]COLOG[facility]  Dept: [phone]  and follow the prompts.  Office hours are 8:00 a.m. to 4:30 p.m. Monday - Friday. Please note that voicemails left after 4:00 p.m. may not be returned until the following business day.  We are closed weekends and major holidays. You have access to a nurse at all times for urgent questions. Please call the main number to the clinic Dept: [phone] and follow the prompts.   For any non-urgent questions, you may also contact your provider using MyChart. We now offer e-Visits for anyone 18 and older to request care online for non-urgent symptoms. For details visit [email or web address].   Also download the MyChart app! Go to the app store, search "MyChart", open the app, select [facility], and log in with your MyChart username and password.  Masks are optional in the cancer centers. If you would like for your care team to wear a mask while they are taking care of you, please let them know. You may have one support person who is at least 60 years old accompany you for your appointments. 

## 2022-08-12 NOTE — Progress Notes (Signed)
Called placed to patient's sister Brett Campbell to introduce myself as Arboriculturist and to offer available resources.   Discussed one-time $1000 [employer] grant to assist with personal expenses while going through treatment. Advised what is needed to apply and asked if she could bring on 12/27. She confirmed she could. Advised to give to registrar to be sent to me and they would be given grant paperwork to complete. Advised to contact me at earliest convenience after appointment to discuss expenses in detail with paperwork to follow along with. Briefly went over expenses and how to obtain gas card if interested. She verbalized understanding.  She will also be given another one of my cards for any additional financial questions or concerns.

## 2022-08-13 ENCOUNTER — Telehealth: Payer: Self-pay

## 2022-08-13 ENCOUNTER — Ambulatory Visit: Payer: Medicare HMO

## 2022-08-13 ENCOUNTER — Other Ambulatory Visit: Payer: Self-pay | Admitting: Student

## 2022-08-13 DIAGNOSIS — C3490 Malignant neoplasm of unspecified part of unspecified bronchus or lung: Secondary | ICD-10-CM

## 2022-08-13 NOTE — Telephone Encounter (Signed)
-----   Message from Tildon Husky, RN sent at 08/10/2022  3:49 PM EST ----- Regarding: first time treatment call back- Brett Campbell, [place], etoposide Patient received treatment for the first time today. No issues to report. Patient tolerated treatment well!!. Seen by Dr Julien Nordmann, Hunt Oris, Norma Fredrickson, etoposide.

## 2022-08-13 NOTE — Telephone Encounter (Signed)
LM for sister  [name]  that this nurse was calling to see how Mr. Vora was doing after their treatment. Please call back to Dr. Peggye Ley  nurse at [phone] if she has any questions or concerns regarding the treatment.

## 2022-08-14 ENCOUNTER — Ambulatory Visit (HOSPITAL_COMMUNITY)
Admission: RE | Admit: 2022-08-14 | Discharge: 2022-08-14 | Disposition: A | Discharge status: Home / Self Care | Payer: Medicare HMO | Source: Ambulatory Visit | Attending: Physician Assistant | Admitting: Physician Assistant

## 2022-08-14 ENCOUNTER — Other Ambulatory Visit: Payer: Self-pay

## 2022-08-14 ENCOUNTER — Encounter (HOSPITAL_COMMUNITY): Payer: Self-pay

## 2022-08-14 DIAGNOSIS — E039 Hypothyroidism, unspecified: Secondary | ICD-10-CM | POA: Insufficient documentation

## 2022-08-14 DIAGNOSIS — F17210 Nicotine dependence, cigarettes, uncomplicated: Secondary | ICD-10-CM | POA: Insufficient documentation

## 2022-08-14 DIAGNOSIS — C3490 Malignant neoplasm of unspecified part of unspecified bronchus or lung: Secondary | ICD-10-CM | POA: Diagnosis not present

## 2022-08-14 DIAGNOSIS — J449 Chronic obstructive pulmonary disease, unspecified: Secondary | ICD-10-CM | POA: Diagnosis not present

## 2022-08-14 DIAGNOSIS — Z452 Encounter for adjustment and management of vascular access device: Secondary | ICD-10-CM | POA: Diagnosis not present

## 2022-08-14 DIAGNOSIS — H9190 Unspecified hearing loss, unspecified ear: Secondary | ICD-10-CM | POA: Diagnosis not present

## 2022-08-14 DIAGNOSIS — I10 Essential (primary) hypertension: Secondary | ICD-10-CM | POA: Insufficient documentation

## 2022-08-14 HISTORY — PX: IR IMAGING GUIDED PORT INSERTION: IMG5740

## 2022-08-14 MED ORDER — MIDAZOLAM HCL 2 MG/2ML IJ SOLN
INTRAMUSCULAR | Status: AC | PRN
  Administered 2022-08-14 (×2): .5 mg via INTRAVENOUS

## 2022-08-14 MED ORDER — HEPARIN SOD (PORK) LOCK FLUSH 100 UNIT/ML IV SOLN
INTRAVENOUS | Status: AC | PRN
  Administered 2022-08-14: 500 [IU]

## 2022-08-14 MED ORDER — HEPARIN SOD (PORK) LOCK FLUSH 100 UNIT/ML IV SOLN
INTRAVENOUS | Status: AC
  Filled 2022-08-14: qty 5

## 2022-08-14 MED ORDER — LACTATED RINGERS IV SOLN: INTRAVENOUS | Status: DC

## 2022-08-14 MED ORDER — FENTANYL CITRATE (PF) 100 MCG/2ML IJ SOLN
INTRAMUSCULAR | Status: AC
  Filled 2022-08-14: qty 2

## 2022-08-14 MED ORDER — SODIUM CHLORIDE 0.9 % IV SOLN: INTRAVENOUS | Status: DC

## 2022-08-14 MED ORDER — LIDOCAINE-EPINEPHRINE 1 %-1:100000 IJ SOLN
INTRAMUSCULAR | Status: AC
  Filled 2022-08-14: qty 1

## 2022-08-14 MED ORDER — LIDOCAINE-EPINEPHRINE 1 %-1:100000 IJ SOLN
INTRAMUSCULAR | Status: AC | PRN
  Administered 2022-08-14: 18 mL via INTRADERMAL

## 2022-08-14 MED ORDER — MIDAZOLAM HCL 2 MG/2ML IJ SOLN
INTRAMUSCULAR | Status: AC
  Filled 2022-08-14: qty 2

## 2022-08-14 MED ORDER — FENTANYL CITRATE (PF) 100 MCG/2ML IJ SOLN
INTRAMUSCULAR | Status: AC | PRN
  Administered 2022-08-14 (×2): 25 ug via INTRAVENOUS

## 2022-08-14 NOTE — Procedures (Signed)
Pre Procedure Dx: Poor venous access Post Procedural Dx: Same  Successful placement of right IJ approach port-a-cath with tip at the superior caval atrial junction. The catheter is ready for immediate use.  Estimated Blood Loss: Trace  Complications: None immediate.  Jay Flora Ratz, MD Pager #: [phone]   

## 2022-08-14 NOTE — H&P (Signed)
Chief Complaint: Patient was seen in consultation today for small cell lung cancer  Referring Physician(s): Heilingoetter,Cassandra L  Supervising Physician: Sandi Mariscal  Patient Status: [facility] - Out-pt  History of Present Illness: Brett Campbell is a 61 y.o. male with past medical history of deafness, COPD, HTN, hypothyroidism who ws recently diagnosed with small cell lung cancer. He has plans for upcoming chemotherapy but has poor venous access.  He is referred to IR for Port-A-Cath placement.   Patient presents today in his usual state of health.  He has been NPO.  Assessment completed with assistance from sign language interpreter and sister at bedside. She is available for recovery assistance post-procedure. He is aware of the goals of the procedure and is agreeable to proceed.   Past Medical History:  Diagnosis Date   Calcium deficiency    Cataract    COPD (chronic obstructive pulmonary disease) ([place])    moderate emphysema   Deaf    Hypertension    Hypothyroidism    Personal history of colonic polyps 11/03/2020   Pneumonia    August 2023   Scarlet fever    Sleep apnea    never used a Cpap, used to use O2   Thyroid disease    Tuberculosis    [place]. Health department is contact with him due to hx.    Past Surgical History:  Procedure Laterality Date   CHOLECYSTECTOMY     COLONOSCOPY     +5years   LAPAROSCOPIC RIGHT HEMI COLECTOMY N/A 02/26/2021   Procedure: LAPAROSCOPIC RIGHT HEMI COLECTOMY;  Surgeon: Ileana Roup, MD;  Location: [facility];  Service: General;  Laterality: N/A;   THYROIDECTOMY     VIDEO BRONCHOSCOPY WITH ENDOBRONCHIAL ULTRASOUND N/A 07/24/2022   Procedure: VIDEO BRONCHOSCOPY WITH ENDOBRONCHIAL ULTRASOUND  WITH LEFT LOWER LOBE  MASS BIOPSIES;  Surgeon: [name], MD;  Location: [facility];  Service: Pulmonary;  Laterality: N/A;    Allergies: Penicillins  Medications: Prior to Admission medications   Medication Sig Start Date End  Date Taking? Authorizing Provider  amLODipine (NORVASC) 5 MG tablet Take 1 tablet (5 mg total) by mouth daily. 09/23/21  Yes Boscia, Heather E, NP  atorvastatin (LIPITOR) 10 MG tablet TAKE 1 TABLET EVERY DAY 07/15/22  Yes Boscia, Heather E, NP  calcitRIOL (ROCALTROL) 0.25 MCG capsule Take 1 capsule (0.25 mcg total) by mouth daily. 12/26/21  Yes Shamleffer, Melanie Crazier, MD  Calcium Carbonate-Vit D-Min (CALTRATE 600+D PLUS MINERALS) 600-800 MG-UNIT CHEW Chew 1,200 mg by mouth 2 (two) times daily. Patient taking differently: Chew 2 tablets by mouth 2 (two) times daily. 11/22/20  Yes Shamleffer, Melanie Crazier, MD  colestipol (COLESTID) 1 g tablet Take 1 tablet (1 g total) by mouth 2 (two) times daily. Patient taking differently: Take 1 g by mouth daily. 06/25/22  Yes Thornton Park, MD  guaifenesin (MUCUS RELIEF CHEST CONGESTION) 400 MG TABS tablet Take 400 mg by mouth every 4 (four) hours as needed (cough).   Yes [provider]  ibuprofen (ADVIL) 200 MG tablet Take 400-800 mg by mouth every 6 (six) hours as needed for moderate pain.   Yes [provider]  levothyroxine (SYNTHROID) 150 MCG tablet Take 1 tablet (150 mcg total) by mouth daily. 12/26/21  Yes Shamleffer, Melanie Crazier, MD  lisinopril (ZESTRIL) 10 MG tablet TAKE 1 TABLET EVERY DAY 07/15/22  Yes [name], NP  potassium chloride SA (KLOR-CON M) 20 MEQ tablet TAKE 1 TABLET EVERY DAY 07/15/22  Yes Leretha Pol  E, NP  prochlorperazine (COMPAZINE) 10 MG tablet Take 1 tablet (10 mg total) by mouth every 6 (six) hours as needed. 07/31/22  Yes Heilingoetter, Cassandra L, PA-C  lidocaine-prilocaine (EMLA) cream Apply 1 Application topically as needed. 07/31/22   Heilingoetter, Cassandra L, PA-C  lidocaine-prilocaine (EMLA) cream Apply to affected area once 08/10/22   Curt Bears, MD  ondansetron (ZOFRAN) 8 MG tablet Take 1 tablet (8 mg total) by mouth every 8 (eight) hours as needed for nausea, vomiting or  refractory nausea / vomiting. Start on the third day after carboplatin. 08/10/22   Curt Bears, MD  prochlorperazine (COMPAZINE) 10 MG tablet Take 1 tablet (10 mg total) by mouth every 6 (six) hours as needed for nausea or vomiting. 08/10/22   Curt Bears, MD     Family History  Problem Relation Age of Onset   CAD Mother    Diabetes Mellitus II Mother    Diabetes Mother    CAD Father    Heart attack Father    Heart disease Father    Diabetes Sister    Colon cancer Neg Hx    Esophageal cancer Neg Hx    Stomach cancer Neg Hx    Colon polyps Neg Hx     Social History   Socioeconomic History   Marital status: Single    Spouse name: Not on file   Number of children: Not on file   Years of education: Not on file   Highest education level: Not on file  Occupational History   Not on file  Tobacco Use   Smoking status: Some Days    Packs/day: 0.25    Years: 45.00    Total pack years: 11.25    Types: Cigarettes   Smokeless tobacco: Never   Tobacco comments:    pt states he smokes 1 cigarette per day  Vaping Use   Vaping Use: Never used  Substance and Sexual Activity   Alcohol use: Yes    Alcohol/week: 1.0 - 2.0 standard drink of alcohol    Types: 1 - 2 Cans of beer per week    Comment: very rare   Drug use: Yes    Types: Marijuana   Sexual activity: Not Currently  Other Topics Concern   Not on file  Social History Narrative   Not on file   Social Determinants of Health   Financial Resource Strain: High Risk (07/31/2022)   Overall Financial Resource Strain (CARDIA)    Difficulty of Paying Living Expenses: Hard  Food Insecurity: Not on file  Transportation Needs: Unmet Transportation Needs (07/31/2022)   PRAPARE - Hydrologist (Medical): Yes    Lack of Transportation (Non-Medical): Yes  Physical Activity: Not on file  Stress: Not on file  Social Connections: Not on file     Review of Systems: A 12 point ROS discussed and  pertinent positives are indicated in the HPI above.  All other systems are negative.  Review of Systems  Constitutional:  Negative for fatigue and fever.  Respiratory:  Negative for cough and shortness of breath.   Cardiovascular:  Negative for chest pain.  Gastrointestinal:  Negative for abdominal pain and nausea.  Musculoskeletal:  Negative for back pain.  Psychiatric/Behavioral:  Negative for behavioral problems and confusion.     Vital Signs: BP 119/75   Pulse (!) 109   Temp 98.1 F (36.7 C) (Oral)   Resp 20   SpO2 95%   Physical Exam Vitals and nursing note  reviewed.  Constitutional:      General: He is not in acute distress.    Appearance: Normal appearance. He is not ill-appearing.  HENT:     Mouth/Throat:     Mouth: Mucous membranes are moist.     Pharynx: Oropharynx is clear.  Cardiovascular:     Rate and Rhythm: Normal rate and regular rhythm.  Pulmonary:     Effort: Pulmonary effort is normal. No respiratory distress.     Breath sounds: Normal breath sounds.  Abdominal:     General: Abdomen is flat.     Palpations: Abdomen is soft.  Musculoskeletal:     Cervical back: Neck supple.  Skin:    General: Skin is warm and dry.  Neurological:     General: No focal deficit present.     Mental Status: He is alert and oriented to person, place, and time. Mental status is at baseline.  Psychiatric:        Mood and Affect: Mood normal.        Behavior: Behavior normal.        Thought Content: Thought content normal.        Judgment: Judgment normal.      MD Evaluation Airway: WNL Heart: WNL Abdomen: WNL Chest/ Lungs: WNL ASA  Classification: 3 Mallampati/Airway Score: Two   Imaging: MR BRAIN W WO CONTRAST  Result Date: 08/02/2022 CLINICAL DATA:  Small cell lung cancer. EXAM: MRI HEAD WITHOUT AND WITH CONTRAST TECHNIQUE: Multiplanar, multiecho pulse sequences of the brain and surrounding structures were obtained without and with intravenous contrast.  CONTRAST:  77mL GADAVIST GADOBUTROL 1 MMOL/ML IV SOLN COMPARISON:  None Available. FINDINGS: Brain: Numerous enhancing brain lesions consistent with metastatic disease. Many are fairly subtle on postcontrast imaging, although marked on series 16, and best seen by diffusion. At least 22 lesions are present. The largest is along the high left frontal cortex and measures 18 mm. A notable lesion without edema is seen in the right cerebral peduncle. Limited brain edema at metastatic disease. Prominent mineralization in the deep cerebellum from uncertain insult, no excessive mineralization in the basal ganglia. There are enlarged endolymphatic sacs correlating with history of deafness. No acute infarct, hydrocephalus, or collection. Vascular: Normal flow voids. Skull and upper cervical spine: Normal marrow signal. Sinuses/Orbits: Negative. These results will be called to the ordering clinician or representative by the Radiologist Assistant, and communication documented in the PACS or [employer]. 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 The largest measures 18 mm along the left frontal cortex. No worrisome mass effect. Electronically Signed   By: Jorje Guild M.D.   On: 08/02/2022 10:20   NM PET Image Initial (PI) Skull Base To Thigh  Result Date: 07/28/2022 CLINICAL DATA:  Initial treatment strategy for lung mass. EXAM: NUCLEAR MEDICINE PET SKULL BASE TO THIGH TECHNIQUE: 9.1 mCi F-18 FDG was injected intravenously. Full-ring PET imaging was performed from the skull base to thigh after the radiotracer. CT data was obtained and used for attenuation correction and anatomic localization. Fasting blood glucose: 104 mg/dl COMPARISON:  CT chest 06/19/2022. FINDINGS: Mediastinal blood pool activity: SUV max 3.3 Liver activity: SUV max NA NECK: No abnormal hypermetabolism. Incidental CT findings: None. CHEST: Masslike consolidation in the left lower lobe, better measured on  06/19/2022, SUV max 13.2. Focal hypermetabolism in the mid esophagus, just below the level of the carina, SUV max 7.5. No definite CT correlate. Focal intercostal uptake between the posterior left second third ribs, SUV  max 4.3, corresponding to an 8 mm extrapleural lymph node (4/51). Small left axillary lymph nodes do not show metabolism above blood pool. Incidental CT findings: Thyroidectomy. Atherosclerotic calcification of the aorta and coronary arteries. Heart is at the upper limits of normal in size. Obstructed left lower lobe bronchus with new collapse/consolidation in the left lower lobe. Centrilobular and paraseptal emphysema. No pericardial effusion. Small left pleural effusion. ABDOMEN/PELVIS: Hypermetabolic bilateral adrenal masses. Index left adrenal mass measures 3.6 x 4.2 cm, SUV max 12.2. Nodule hypermetabolism throughout the pancreas. Index nodule in the body of the pancreas, SUV max 10.9. Correlation with CT is challenging without IV or oral contrast. Hypermetabolic nodules are seen in the oblique abdominal wall musculature of the left lower quadrant and lateral to the descending colon with index 11 mm nodule (4/124), SUV max 7.2. Small upper abdominal lymph nodes are hypermetabolic with index gastrohepatic ligament lymph node measuring 7 mm (4/107), SUV max 10.9. No abnormal hypermetabolism in the liver or spleen. Incidental CT findings: Liver is unremarkable. Cholecystectomy. Bilateral adrenal masses are discussed above. Subcentimeter lesion in the right kidney, too small to characterize. Kidneys, spleen, pancreas and stomach are otherwise unremarkable with the exception of a small hiatal hernia. Right hemicolectomy. Atherosclerotic calcification of the aorta. SKELETON: No abnormal hypermetabolism. Incidental CT findings: Degenerative changes in the spine. IMPRESSION: 1. Stage IV primary bronchogenic carcinoma as evidenced by a hypermetabolic obstructing left lower lobe mass with postobstructive  collapse/consolidation in the left lower lobe with the following hypermetabolic metastases: Left extrapleural lymph node, bilateral adrenal masses, pancreatic lesions, upper abdominal lymph nodes and soft tissue nodules in the abdomen and abdominal wall. 2. Focal hypermetabolism in the mid esophagus, without a definite CT correlate. Consider esophagram in further evaluation, as clinically indicated, as malignancy cannot be excluded. 3. Small left pleural effusion. 4. Aortic atherosclerosis (ICD10-I70.0). Coronary artery calcification. 5.  Emphysema (ICD10-J43.9). Electronically Signed   By: Lorin Picket M.D.   On: 07/28/2022 14:10   DG CHEST PORT 1 VIEW  Result Date: 07/24/2022 CLINICAL DATA:  Status post bronchoscopy and biopsy EXAM: PORTABLE CHEST 1 VIEW COMPARISON:  In 06/03/2022, 06/19/2022 FINDINGS: Normal heart size. Known left hilar mass, better seen by recent CT. No new airspace consolidation. No pleural effusion or pneumothorax. IMPRESSION: No pneumothorax following bronchoscopy. Electronically Signed   By: Davina Poke D.O.   On: 07/24/2022 12:35    Labs:  CBC: Recent Labs    03/28/22 2347 03/30/22 0633 04/10/22 1014 07/24/22 0926 08/10/22 0911  WBC 10.6* 8.6 10.7  --  8.3  HGB 11.5* 12.2* 14.6 14.6 14.1  HCT 33.4* 34.8* 44.0 43.0 40.6  PLT 250 275 354  --  296    COAGS: Recent Labs    03/28/22 1850  INR 1.1  APTT 28    BMP: Recent Labs    03/28/22 1850 03/28/22 2347 03/30/22 0633 04/10/22 1014 04/28/22 1038 07/24/22 0926 08/10/22 0911  NA 136 138 139 139 139 139 135  K 3.5 3.1* 3.4* 5.0 4.7 3.8 4.9  CL 100 104 105 97 103 106 101  CO2 25 24 24 24  19*  --  25  GLUCOSE 103* 106* 82 80 91 90 91  BUN 13 10 10 21 14 14  28*  CALCIUM 7.3* 6.8* 6.8* 9.9 8.9  --  8.8*  CREATININE 1.32* 1.11 1.01 1.71* 1.17 1.20 1.47*  GFRNONAA >60 >60 >60  --   --   --  54*    LIVER FUNCTION TESTS: Recent Labs  03/25/22 1445 03/28/22 1850 03/28/22 2347 08/10/22 0911   BILITOT 0.7 0.5 0.7 0.3  AST 24 21 18 22   ALT 24 22 18 21   ALKPHOS 71 56 47 62  PROT 7.1 6.4* 5.7* 6.9  ALBUMIN 2.8* 2.5* 2.2* 4.1    TUMOR MARKERS: No results for input(s): "AFPTM", "CEA", "CA199", "CHROMGRNA" in the last 8760 hours.  Assessment and Plan: Patient with past medical history of deafness, HTN, hypothyroidism, recently diagnosed small cell lung cancer presents with complaint of poor venous access.  IR consulted for Port-A-Cath placement at the request of Dr. Julien Nordmann. Case reviewed by Dr. Pascal Lux who approves patient for procedure.  Patient presents today in their usual state of health.  He has been NPO and is not currently on blood thinners.   Risks and benefits of image guided port-a-catheter placement was discussed with the patient including, but not limited to bleeding, infection, pneumothorax, or fibrin sheath development and need for additional procedures.  All of the patient's questions were answered, patient is agreeable to proceed. Consent signed and in chart.   Thank you for this interesting consult.  I greatly enjoyed meeting Brett Campbell and look forward to participating in their care.  A copy of this report was sent to the requesting provider on this date.  Electronically Signed: Docia Barrier, PA 08/14/2022, 1:23 PM   I spent a total of  30 Minutes   in face to face in clinical consultation, greater than 50% of which was counseling/coordinating care for small cell lung cancer.

## 2022-08-18 ENCOUNTER — Other Ambulatory Visit: Payer: Self-pay

## 2022-08-18 DIAGNOSIS — C3490 Malignant neoplasm of unspecified part of unspecified bronchus or lung: Secondary | ICD-10-CM

## 2022-08-19 ENCOUNTER — Emergency Department (HOSPITAL_COMMUNITY): Payer: Medicare HMO

## 2022-08-19 ENCOUNTER — Other Ambulatory Visit: Payer: Self-pay

## 2022-08-19 ENCOUNTER — Other Ambulatory Visit: Payer: Self-pay | Admitting: Physician Assistant

## 2022-08-19 ENCOUNTER — Inpatient Hospital Stay: Payer: Medicare HMO

## 2022-08-19 ENCOUNTER — Inpatient Hospital Stay (HOSPITAL_BASED_OUTPATIENT_CLINIC_OR_DEPARTMENT_OTHER): Payer: Medicare HMO | Admitting: Internal Medicine

## 2022-08-19 ENCOUNTER — Inpatient Hospital Stay (HOSPITAL_COMMUNITY)
Admission: EM | Admit: 2022-08-19 | Discharge: 2022-08-22 | DRG: 682 | Disposition: A | Discharge status: Home / Self Care | Payer: Medicare HMO | Source: Ambulatory Visit | Attending: Family Medicine | Admitting: Family Medicine

## 2022-08-19 ENCOUNTER — Inpatient Hospital Stay (HOSPITAL_COMMUNITY): Payer: Medicare HMO

## 2022-08-19 VITALS — BP 93/55 | HR 96 | Resp 22

## 2022-08-19 VITALS — BP 77/54 | HR 117 | Temp 97.8°F | Resp 18 | Wt 170.3 lb

## 2022-08-19 DIAGNOSIS — F17210 Nicotine dependence, cigarettes, uncomplicated: Secondary | ICD-10-CM | POA: Diagnosis present

## 2022-08-19 DIAGNOSIS — R0682 Tachypnea, not elsewhere classified: Secondary | ICD-10-CM | POA: Diagnosis not present

## 2022-08-19 DIAGNOSIS — N179 Acute kidney failure, unspecified: Secondary | ICD-10-CM

## 2022-08-19 DIAGNOSIS — A419 Sepsis, unspecified organism: Secondary | ICD-10-CM | POA: Diagnosis not present

## 2022-08-19 DIAGNOSIS — I10 Essential (primary) hypertension: Secondary | ICD-10-CM | POA: Diagnosis present

## 2022-08-19 DIAGNOSIS — I959 Hypotension, unspecified: Secondary | ICD-10-CM | POA: Insufficient documentation

## 2022-08-19 DIAGNOSIS — D696 Thrombocytopenia, unspecified: Secondary | ICD-10-CM | POA: Diagnosis not present

## 2022-08-19 DIAGNOSIS — Z23 Encounter for immunization: Secondary | ICD-10-CM | POA: Diagnosis not present

## 2022-08-19 DIAGNOSIS — E782 Mixed hyperlipidemia: Secondary | ICD-10-CM | POA: Diagnosis present

## 2022-08-19 DIAGNOSIS — D649 Anemia, unspecified: Secondary | ICD-10-CM | POA: Diagnosis present

## 2022-08-19 DIAGNOSIS — C7971 Secondary malignant neoplasm of right adrenal gland: Secondary | ICD-10-CM | POA: Diagnosis not present

## 2022-08-19 DIAGNOSIS — J189 Pneumonia, unspecified organism: Secondary | ICD-10-CM | POA: Diagnosis not present

## 2022-08-19 DIAGNOSIS — C3490 Malignant neoplasm of unspecified part of unspecified bronchus or lung: Secondary | ICD-10-CM | POA: Diagnosis present

## 2022-08-19 DIAGNOSIS — E861 Hypovolemia: Secondary | ICD-10-CM | POA: Diagnosis present

## 2022-08-19 DIAGNOSIS — Z1152 Encounter for screening for COVID-19: Secondary | ICD-10-CM | POA: Diagnosis not present

## 2022-08-19 DIAGNOSIS — R059 Cough, unspecified: Secondary | ICD-10-CM | POA: Diagnosis not present

## 2022-08-19 DIAGNOSIS — G4730 Sleep apnea, unspecified: Secondary | ICD-10-CM | POA: Diagnosis present

## 2022-08-19 DIAGNOSIS — Z79890 Hormone replacement therapy (postmenopausal): Secondary | ICD-10-CM | POA: Diagnosis not present

## 2022-08-19 DIAGNOSIS — H9190 Unspecified hearing loss, unspecified ear: Secondary | ICD-10-CM | POA: Diagnosis present

## 2022-08-19 DIAGNOSIS — C3432 Malignant neoplasm of lower lobe, left bronchus or lung: Secondary | ICD-10-CM | POA: Diagnosis present

## 2022-08-19 DIAGNOSIS — C7972 Secondary malignant neoplasm of left adrenal gland: Secondary | ICD-10-CM | POA: Diagnosis not present

## 2022-08-19 DIAGNOSIS — I951 Orthostatic hypotension: Secondary | ICD-10-CM

## 2022-08-19 DIAGNOSIS — Z880 Allergy status to penicillin: Secondary | ICD-10-CM

## 2022-08-19 DIAGNOSIS — Z79899 Other long term (current) drug therapy: Secondary | ICD-10-CM

## 2022-08-19 DIAGNOSIS — J440 Chronic obstructive pulmonary disease with acute lower respiratory infection: Secondary | ICD-10-CM | POA: Diagnosis present

## 2022-08-19 DIAGNOSIS — R627 Adult failure to thrive: Secondary | ICD-10-CM | POA: Diagnosis present

## 2022-08-19 DIAGNOSIS — E871 Hypo-osmolality and hyponatremia: Secondary | ICD-10-CM | POA: Diagnosis present

## 2022-08-19 DIAGNOSIS — E860 Dehydration: Secondary | ICD-10-CM | POA: Diagnosis present

## 2022-08-19 DIAGNOSIS — R6521 Severe sepsis with septic shock: Secondary | ICD-10-CM | POA: Diagnosis not present

## 2022-08-19 DIAGNOSIS — J439 Emphysema, unspecified: Secondary | ICD-10-CM | POA: Diagnosis present

## 2022-08-19 DIAGNOSIS — E039 Hypothyroidism, unspecified: Secondary | ICD-10-CM | POA: Diagnosis present

## 2022-08-19 DIAGNOSIS — R000 Tachycardia, unspecified: Secondary | ICD-10-CM | POA: Diagnosis not present

## 2022-08-19 DIAGNOSIS — E790 Hyperuricemia without signs of inflammatory arthritis and tophaceous disease: Secondary | ICD-10-CM | POA: Diagnosis present

## 2022-08-19 DIAGNOSIS — I9589 Other hypotension: Secondary | ICD-10-CM | POA: Diagnosis not present

## 2022-08-19 DIAGNOSIS — Z8249 Family history of ischemic heart disease and other diseases of the circulatory system: Secondary | ICD-10-CM | POA: Diagnosis not present

## 2022-08-19 DIAGNOSIS — J168 Pneumonia due to other specified infectious organisms: Secondary | ICD-10-CM | POA: Diagnosis not present

## 2022-08-19 DIAGNOSIS — K869 Disease of pancreas, unspecified: Secondary | ICD-10-CM | POA: Diagnosis present

## 2022-08-19 DIAGNOSIS — Z833 Family history of diabetes mellitus: Secondary | ICD-10-CM

## 2022-08-19 LAB — CBC WITH DIFFERENTIAL/PLATELET
Abs Immature Granulocytes: 0.05 10*3/uL (ref 0.00–0.07)
Basophils Absolute: 0 10*3/uL (ref 0.0–0.1)
Basophils Relative: 1 %
Eosinophils Absolute: 0 10*3/uL (ref 0.0–0.5)
Eosinophils Relative: 1 %
HCT: 33.2 % — ABNORMAL LOW (ref 39.0–52.0)
Hemoglobin: 11.9 g/dL — ABNORMAL LOW (ref 13.0–17.0)
Immature Granulocytes: 1 %
Lymphocytes Relative: 5 %
Lymphs Abs: 0.2 10*3/uL — ABNORMAL LOW (ref 0.7–4.0)
MCH: 29.6 pg (ref 26.0–34.0)
MCHC: 35.8 g/dL (ref 30.0–36.0)
MCV: 82.6 fL (ref 80.0–100.0)
Monocytes Absolute: 0.6 10*3/uL (ref 0.1–1.0)
Monocytes Relative: 14 %
Neutro Abs: 3.5 10*3/uL (ref 1.7–7.7)
Neutrophils Relative %: 78 %
Platelets: 100 10*3/uL — ABNORMAL LOW (ref 150–400)
RBC: 4.02 MIL/uL — ABNORMAL LOW (ref 4.22–5.81)
RDW: 12.5 % (ref 11.5–15.5)
WBC: 4.4 10*3/uL (ref 4.0–10.5)
nRBC: 0 % (ref 0.0–0.2)

## 2022-08-19 LAB — I-STAT CHEM 8, ED
BUN: 111 mg/dL — ABNORMAL HIGH (ref 6–20)
Calcium, Ion: 0.81 mmol/L — CL (ref 1.15–1.40)
Chloride: 100 mmol/L (ref 98–111)
Creatinine, Ser: 7.2 mg/dL — ABNORMAL HIGH (ref 0.61–1.24)
Glucose, Bld: 140 mg/dL — ABNORMAL HIGH (ref 70–99)
HCT: 32 % — ABNORMAL LOW (ref 39.0–52.0)
Hemoglobin: 10.9 g/dL — ABNORMAL LOW (ref 13.0–17.0)
Potassium: 5.4 mmol/L — ABNORMAL HIGH (ref 3.5–5.1)
Sodium: 127 mmol/L — ABNORMAL LOW (ref 135–145)
TCO2: 14 mmol/L — ABNORMAL LOW (ref 22–32)

## 2022-08-19 LAB — COMPREHENSIVE METABOLIC PANEL
ALT: 18 U/L (ref 0–44)
AST: 17 U/L (ref 15–41)
Albumin: 3.4 g/dL — ABNORMAL LOW (ref 3.5–5.0)
Alkaline Phosphatase: 65 U/L (ref 38–126)
Anion gap: 16 — ABNORMAL HIGH (ref 5–15)
BUN: 97 mg/dL — ABNORMAL HIGH (ref 6–20)
CO2: 16 mmol/L — ABNORMAL LOW (ref 22–32)
Calcium: 7.1 mg/dL — ABNORMAL LOW (ref 8.9–10.3)
Chloride: 94 mmol/L — ABNORMAL LOW (ref 98–111)
Creatinine, Ser: 5.66 mg/dL (ref 0.61–1.24)
GFR, Estimated: 11 mL/min — ABNORMAL LOW (ref >60)
Glucose, Bld: 164 mg/dL — ABNORMAL HIGH (ref 70–99)
Potassium: 5 mmol/L (ref 3.5–5.1)
Sodium: 126 mmol/L — ABNORMAL LOW (ref 135–145)
Total Bilirubin: 0.9 mg/dL (ref 0.3–1.2)
Total Protein: 7.4 g/dL (ref 6.5–8.1)

## 2022-08-19 LAB — LACTATE DEHYDROGENASE: LDH: 192 U/L (ref 98–192)

## 2022-08-19 LAB — RESP PANEL BY RT-PCR (RSV, FLU A&B, COVID)  RVPGX2
Influenza A by PCR: NEGATIVE
Influenza B by PCR: NEGATIVE
Resp Syncytial Virus by PCR: NEGATIVE
SARS Coronavirus 2 by RT PCR: NEGATIVE

## 2022-08-19 LAB — LACTIC ACID, PLASMA: Lactic Acid, Venous: 0.8 mmol/L (ref 0.5–1.9)

## 2022-08-19 LAB — URIC ACID: Uric Acid, Serum: 11.3 mg/dL — ABNORMAL HIGH (ref 3.7–8.6)

## 2022-08-19 LAB — PROTIME-INR
INR: 1.2 (ref 0.8–1.2)
Prothrombin Time: 15.5 seconds — ABNORMAL HIGH (ref 11.4–15.2)

## 2022-08-19 LAB — LIPASE, BLOOD: Lipase: 55 U/L — ABNORMAL HIGH (ref 11–51)

## 2022-08-19 LAB — PHOSPHORUS: Phosphorus: 6.3 mg/dL — ABNORMAL HIGH (ref 2.5–4.6)

## 2022-08-19 LAB — APTT: aPTT: 26 seconds (ref 24–36)

## 2022-08-19 MED ORDER — SODIUM CHLORIDE 0.9 % IV SOLN
2.0000 g | INTRAVENOUS | Status: DC
  Filled 2022-08-19: qty 20

## 2022-08-19 MED ORDER — LACTATED RINGERS IV SOLN: INTRAVENOUS | Status: DC

## 2022-08-19 MED ORDER — SODIUM CHLORIDE 0.9% FLUSH: 10.0000 mL | INTRAVENOUS | Status: DC | PRN

## 2022-08-19 MED ORDER — ALBUTEROL SULFATE (2.5 MG/3ML) 0.083% IN NEBU
2.5000 mg | INHALATION_SOLUTION | RESPIRATORY_TRACT | Status: DC | PRN
  Administered 2022-08-19: 2.5 mg via RESPIRATORY_TRACT
  Filled 2022-08-19: qty 3

## 2022-08-19 MED ORDER — ONDANSETRON HCL 4 MG PO TABS: 4.0000 mg | ORAL_TABLET | Freq: Four times a day (QID) | ORAL | Status: DC | PRN

## 2022-08-19 MED ORDER — SODIUM CHLORIDE 0.9 % IV SOLN: INTRAVENOUS | Status: AC

## 2022-08-19 MED ORDER — HEPARIN SOD (PORK) LOCK FLUSH 100 UNIT/ML IV SOLN: 500.0000 [IU] | Freq: Once | INTRAVENOUS | Status: DC

## 2022-08-19 MED ORDER — ACETAMINOPHEN 325 MG PO TABS
650.0000 mg | ORAL_TABLET | Freq: Four times a day (QID) | ORAL | Status: DC | PRN
  Administered 2022-08-20: 650 mg via ORAL
  Filled 2022-08-19: qty 2

## 2022-08-19 MED ORDER — INFLUENZA VAC SPLIT QUAD 0.5 ML IM SUSY
0.5000 mL | PREFILLED_SYRINGE | Freq: Once | INTRAMUSCULAR | Status: AC
  Administered 2022-08-19: 0.5 mL via INTRAMUSCULAR
  Filled 2022-08-19: qty 0.5

## 2022-08-19 MED ORDER — ACETAMINOPHEN 650 MG RE SUPP: 650.0000 mg | Freq: Four times a day (QID) | RECTAL | Status: DC | PRN

## 2022-08-19 MED ORDER — ONDANSETRON HCL 4 MG/2ML IJ SOLN
4.0000 mg | Freq: Four times a day (QID) | INTRAMUSCULAR | Status: DC | PRN
  Administered 2022-08-20: 4 mg via INTRAVENOUS
  Filled 2022-08-19: qty 2

## 2022-08-19 MED ORDER — SODIUM CHLORIDE 0.9 % IV SOLN
2.0000 g | Freq: Once | INTRAVENOUS | Status: AC
  Administered 2022-08-19: 2 g via INTRAVENOUS
  Filled 2022-08-19: qty 20

## 2022-08-19 MED ORDER — SODIUM CHLORIDE 0.9 % IV SOLN
500.0000 mg | INTRAVENOUS | Status: DC
  Filled 2022-08-19: qty 5

## 2022-08-19 MED ORDER — LACTATED RINGERS IV BOLUS
1000.0000 mL | Freq: Once | INTRAVENOUS | Status: AC
  Administered 2022-08-19: 1000 mL via INTRAVENOUS

## 2022-08-19 MED ORDER — LACTATED RINGERS IV BOLUS (SEPSIS)
1500.0000 mL | Freq: Once | INTRAVENOUS | Status: AC
  Administered 2022-08-19: 1500 mL via INTRAVENOUS

## 2022-08-19 MED ORDER — SODIUM CHLORIDE 0.9 % IV SOLN: Freq: Once | INTRAVENOUS | Status: AC

## 2022-08-19 MED ORDER — ONDANSETRON HCL 4 MG/2ML IJ SOLN
4.0000 mg | Freq: Once | INTRAMUSCULAR | Status: AC
  Administered 2022-08-19: 4 mg via INTRAVENOUS
  Filled 2022-08-19: qty 2

## 2022-08-19 MED ORDER — CHLORHEXIDINE GLUCONATE CLOTH 2 % EX PADS
6.0000 | MEDICATED_PAD | Freq: Every day | CUTANEOUS | Status: DC
  Administered 2022-08-19 – 2022-08-21 (×3): 6 via TOPICAL

## 2022-08-19 MED ORDER — LEVOTHYROXINE SODIUM 50 MCG PO TABS
150.0000 ug | ORAL_TABLET | Freq: Every day | ORAL | Status: DC
  Administered 2022-08-20 – 2022-08-22 (×3): 150 ug via ORAL
  Filled 2022-08-19 (×3): qty 1

## 2022-08-19 MED ORDER — SODIUM CHLORIDE 0.9% FLUSH
10.0000 mL | INTRAVENOUS | Status: DC | PRN
  Administered 2022-08-19: 10 mL via INTRAVENOUS

## 2022-08-19 MED ORDER — SODIUM CHLORIDE 0.9 % IV BOLUS
1000.0000 mL | Freq: Once | INTRAVENOUS | Status: AC
  Administered 2022-08-19: 1000 mL via INTRAVENOUS

## 2022-08-19 MED ORDER — SODIUM CHLORIDE 0.9% FLUSH
3.0000 mL | Freq: Two times a day (BID) | INTRAVENOUS | Status: DC
  Administered 2022-08-19 – 2022-08-22 (×5): 3 mL via INTRAVENOUS

## 2022-08-19 MED ORDER — CALCIUM GLUCONATE-NACL 1-0.675 GM/50ML-% IV SOLN
1.0000 g | Freq: Once | INTRAVENOUS | Status: AC
  Administered 2022-08-19: 1000 mg via INTRAVENOUS
  Filled 2022-08-19: qty 50

## 2022-08-19 MED ORDER — INFLUENZA VAC SPLIT QUAD 0.5 ML IM SUSY: 0.5000 mL | PREFILLED_SYRINGE | Freq: Once | INTRAMUSCULAR | Status: DC

## 2022-08-19 MED ORDER — SODIUM CHLORIDE 0.9 % IV SOLN
500.0000 mg | Freq: Once | INTRAVENOUS | Status: AC
  Administered 2022-08-19: 500 mg via INTRAVENOUS
  Filled 2022-08-19: qty 5

## 2022-08-19 MED ORDER — SODIUM CHLORIDE 0.9 % IV SOLN
6.0000 mg | Freq: Once | INTRAVENOUS | Status: AC
  Administered 2022-08-19: 6 mg via INTRAVENOUS
  Filled 2022-08-19: qty 4

## 2022-08-19 NOTE — Progress Notes (Signed)
[facility] [phone]   [phone]  OFFICE PROGRESS NOTE  [name], NP [place] [address]  DIAGNOSIS: Extensive stage (T4, N3, M1 C) small cell lung cancer presented with obstructing left lower lobe lung mass with left extrapleural lymph node, bilateral adrenal metastasis, pancreatic lesion as well as upper abdominal lymphadenopathy and soft tissue nodules in the abdominal wall as well as the abdomen diagnosed in December 2023.  PRIOR THERAPY: None  CURRENT THERAPY: Systemic chemotherapy with carboplatin for AUC of 5 on day 1, etoposide 100 Mg/M2 on days 1, 2 and 3 with Cosela 240 Mg/M2 on the days of the chemotherapy and Imfinzi 1500 Mg IV every 3 weeks.  First dose August 10, 2022  INTERVAL HISTORY: Brett Campbell 61 y.o. male returns to the clinic today for follow-up visit accompanied by his sister and his sign interpreter.  The patient is complaining of increasing fatigue and weakness as well as lack of appetite and frequent episodes of nausea and few episodes of diarrhea.  He was not eating or drinking enough over the last several days.  He also has some shortness of breath at baseline increased with exertion.  His blood pressure was very low when he presented to the clinic today with a heart rate of 117 and blood pressure of 77/54.  He has no fever or chills.  He has no headache or visual changes but he lost few pounds since his last visit.  He is here today for evaluation and repeat blood work.  MEDICAL HISTORY: Past Medical History:  Diagnosis Date   Calcium deficiency    Cataract    COPD (chronic obstructive pulmonary disease) ([place])    moderate emphysema   Deaf    Hypertension    Hypothyroidism    Personal history of colonic polyps 11/03/2020   Pneumonia    August 2023   Scarlet fever    Sleep apnea    never used a Cpap, used to use O2   Thyroid disease    Tuberculosis    [facility]  dep[facility] is contact with him due to hx.    ALLERGIES:  is allergic to penicillins.  MEDICATIONS:  Current Outpatient Medications  Medication Sig Dispense Refill   amLODipine (NORVASC) 5 MG tablet Take 1 tablet (5 mg total) by mouth daily. 90 tablet 3   atorvastatin (LIPITOR) 10 MG tablet TAKE 1 TABLET EVERY DAY 90 tablet 3   calcitRIOL (ROCALTROL) 0.25 MCG capsule Take 1 capsule (0.25 mcg total) by mouth daily. 90 capsule 3   Calcium Carbonate-Vit D-Min (CALTRATE 600+D PLUS MINERALS) 600-800 MG-UNIT CHEW Chew 1,200 mg by mouth 2 (two) times daily. (Patient taking differently: Chew 2 tablets by mouth 2 (two) times daily.) 360 tablet 3   colestipol (COLESTID) 1 g tablet Take 1 tablet (1 g total) by mouth 2 (two) times daily. (Patient taking differently: Take 1 g by mouth daily.) 60 tablet 5   guaifenesin (MUCUS RELIEF CHEST CONGESTION) 400 MG TABS tablet Take 400 mg by mouth every 4 (four) hours as needed (cough).     ibuprofen (ADVIL) 200 MG tablet Take 400-800 mg by mouth every 6 (six) hours as needed for moderate pain.     levothyroxine (SYNTHROID) 150 MCG tablet Take 1 tablet (150 mcg total) by mouth daily. 90 tablet 3   lidocaine-prilocaine (EMLA) cream Apply to affected area once 30 g 3   lisinopril (ZESTRIL) 10 MG  tablet TAKE 1 TABLET EVERY DAY 90 tablet 3   ondansetron (ZOFRAN) 8 MG tablet Take 1 tablet (8 mg total) by mouth every 8 (eight) hours as needed for nausea, vomiting or refractory nausea / vomiting. Start on the third day after carboplatin. 30 tablet 1   potassium chloride SA (KLOR-CON M) 20 MEQ tablet TAKE 1 TABLET EVERY DAY 90 tablet 3   prochlorperazine (COMPAZINE) 10 MG tablet Take 1 tablet (10 mg total) by mouth every 6 (six) hours as needed. 30 tablet 2   No current facility-administered medications for this visit.    SURGICAL HISTORY:  Past Surgical History:  Procedure Laterality Date   CHOLECYSTECTOMY     COLONOSCOPY     +5years   IR IMAGING GUIDED PORT  INSERTION  08/14/2022   LAPAROSCOPIC RIGHT HEMI COLECTOMY N/A 02/26/2021   Procedure: LAPAROSCOPIC RIGHT HEMI COLECTOMY;  Surgeon: Ileana Roup, MD;  Location: [facility];  Service: General;  Laterality: N/A;   THYROIDECTOMY     VIDEO BRONCHOSCOPY WITH ENDOBRONCHIAL ULTRASOUND N/A 07/24/2022   Procedure: VIDEO BRONCHOSCOPY WITH ENDOBRONCHIAL ULTRASOUND  WITH LEFT LOWER LOBE  MASS BIOPSIES;  Surgeon: [name], MD;  Location: [facility];  Service: Pulmonary;  Laterality: N/A;    REVIEW OF SYSTEMS:  Constitutional: positive for anorexia, fatigue, and weight loss Eyes: negative Ears, nose, mouth, throat, and face: negative Respiratory: positive for dyspnea on exertion Cardiovascular: negative Gastrointestinal: positive for diarrhea and nausea Genitourinary:negative Integument/breast: negative Hematologic/lymphatic: negative Musculoskeletal:positive for muscle weakness Neurological: negative Behavioral/Psych: negative Endocrine: negative Allergic/Immunologic: negative   PHYSICAL EXAMINATION: General appearance: alert, cooperative, and fatigued Head: Normocephalic, without obvious abnormality, atraumatic Neck: no adenopathy, no JVD, supple, symmetrical, trachea midline, and thyroid not enlarged, symmetric, no tenderness/mass/nodules Lymph nodes: Cervical, supraclavicular, and axillary nodes normal. Resp: clear to auscultation bilaterally Back: symmetric, no curvature. ROM normal. No CVA tenderness. Cardio: regular rate and rhythm, S1, S2 normal, no murmur, click, rub or gallop GI: soft, non-tender; bowel sounds normal; no masses,  no organomegaly Extremities: extremities normal, atraumatic, no cyanosis or edema Neurologic: Alert and oriented X 3, normal strength and tone. Normal symmetric reflexes. Normal coordination and gait  ECOG PERFORMANCE STATUS: 1 - Symptomatic but completely ambulatory  Blood pressure (!) 77/54, pulse (!) 117, temperature 97.8 F (36.6 C), temperature  source Oral, resp. rate 18, weight 170 lb 4.8 oz (77.2 kg), SpO2 94 %.  LABORATORY DATA: Lab Results  Component Value Date   WBC 4.4 08/19/2022   HGB 11.9 (L) 08/19/2022   HCT 33.2 (L) 08/19/2022   MCV 82.6 08/19/2022   PLT 100 (L) 08/19/2022      Chemistry      Component Value Date/Time   NA 126 (L) 08/19/2022 1001   NA 139 04/28/2022 1038   K 5.0 08/19/2022 1001   CL 94 (L) 08/19/2022 1001   CO2 16 (L) 08/19/2022 1001   BUN 97 (H) 08/19/2022 1001   BUN 14 04/28/2022 1038   CREATININE 5.66 (HH) 08/19/2022 1001   CREATININE 1.47 (H) 08/10/2022 0911      Component Value Date/Time   CALCIUM 7.1 (L) 08/19/2022 1001   ALKPHOS 65 08/19/2022 1001   AST 17 08/19/2022 1001   AST 22 08/10/2022 0911   ALT 18 08/19/2022 1001   ALT 21 08/10/2022 0911   BILITOT 0.9 08/19/2022 1001   BILITOT 0.3 08/10/2022 0911       RADIOGRAPHIC STUDIES: IR IMAGING GUIDED PORT INSERTION  Result Date: 08/14/2022 INDICATION: History of lung  cancer. In need of durable intravenous access for chemotherapy administration. EXAM: IMPLANTED PORT A CATH PLACEMENT WITH ULTRASOUND AND FLUOROSCOPIC GUIDANCE COMPARISON:  PET-CT-07/27/2022 MEDICATIONS: None ANESTHESIA/SEDATION: Moderate (conscious) sedation was employed during this procedure as administered by the Interventional Radiology RN. A total of Versed 1 mg and Fentanyl 50 mcg was administered intravenously. Moderate Sedation Time: 25 minutes. The patient's level of consciousness and vital signs were monitored continuously by radiology nursing throughout the procedure under my direct supervision. CONTRAST:  None FLUOROSCOPY TIME:  18 seconds (1 mGy) COMPLICATIONS: None immediate. PROCEDURE: The procedure, risks, benefits, and alternatives were explained to the patient. Questions regarding the procedure were encouraged and answered. The patient understands and consents to the procedure. The right neck and chest were prepped with chlorhexidine in a sterile  fashion, and a sterile drape was applied covering the operative field. Maximum barrier sterile technique with sterile gowns and gloves were used for the procedure. A timeout was performed prior to the initiation of the procedure. Local anesthesia was provided with 1% lidocaine with epinephrine. After creating a small venotomy incision, a micropuncture kit was utilized to access the internal jugular vein. Real-time ultrasound guidance was utilized for vascular access including the acquisition of a permanent ultrasound image documenting patency of the accessed vessel. The microwire was utilized to measure appropriate catheter length. A subcutaneous port pocket was then created along the upper chest wall utilizing a combination of sharp and blunt dissection. The pocket was irrigated with sterile saline. A single lumen Slim sized power injectable port was chosen for placement. The 8 Fr catheter was tunneled from the port pocket site to the venotomy incision. The port was placed in the pocket. The external catheter was trimmed to appropriate length. At the venotomy, an 8 Fr peel-away sheath was placed over a guidewire under fluoroscopic guidance. The catheter was then placed through the sheath and the sheath was removed. Final catheter positioning was confirmed and documented with a fluoroscopic spot radiograph. The port was accessed with a Huber needle, aspirated and flushed with heparinized saline. The venotomy site was closed with an interrupted 4-0 Vicryl suture. The port pocket incision was closed with interrupted 2-0 Vicryl suture. Dermabond and Steri-strips were applied to both incisions. Dressings were applied. The patient tolerated the procedure well without immediate post procedural complication. FINDINGS: After catheter placement, the tip lies within the superior cavoatrial junction. The catheter aspirates and flushes normally and is ready for immediate use. IMPRESSION: Successful placement of a right internal  jugular approach power injectable Port-A-Cath. The catheter is ready for immediate use. Electronically Signed   By: Sandi Mariscal M.D.   On: 08/14/2022 15:59   MR BRAIN W WO CONTRAST  Result Date: 08/02/2022 CLINICAL DATA:  Small cell lung cancer. EXAM: MRI HEAD WITHOUT AND WITH CONTRAST TECHNIQUE: Multiplanar, multiecho pulse sequences of the brain and surrounding structures were obtained without and with intravenous contrast. CONTRAST:  27m GADAVIST GADOBUTROL 1 MMOL/ML IV SOLN COMPARISON:  None Available. FINDINGS: Brain: Numerous enhancing brain lesions consistent with metastatic disease. Many are fairly subtle on postcontrast imaging, although marked on series 16, and best seen by diffusion. At least 22 lesions are present. The largest is along the high left frontal cortex and measures 18 mm. A notable lesion without edema is seen in the right cerebral peduncle. Limited brain edema at metastatic disease. Prominent mineralization in the deep cerebellum from uncertain insult, no excessive mineralization in the basal ganglia. There are enlarged endolymphatic sacs correlating with history of deafness.  No acute infarct, hydrocephalus, or collection. Vascular: Normal flow voids. Skull and upper cervical spine: Normal marrow signal. Sinuses/Orbits: Negative. These results will be called to the ordering clinician or representative by the Radiologist Assistant, and communication documented in the PACS or [employer]. 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 The largest measures 18 mm along the left frontal cortex. No worrisome mass effect. Electronically Signed   By: Jorje Guild M.D.   On: 08/02/2022 10:20   NM PET Image Initial (PI) Skull Base To Thigh  Result Date: 07/28/2022 CLINICAL DATA:  Initial treatment strategy for lung mass. EXAM: NUCLEAR MEDICINE PET SKULL BASE TO THIGH TECHNIQUE: 9.1 mCi F-18 FDG was injected intravenously. Full-ring PET imaging was  performed from the skull base to thigh after the radiotracer. CT data was obtained and used for attenuation correction and anatomic localization. Fasting blood glucose: 104 mg/dl COMPARISON:  CT chest 06/19/2022. FINDINGS: Mediastinal blood pool activity: SUV max 3.3 Liver activity: SUV max NA NECK: No abnormal hypermetabolism. Incidental CT findings: None. CHEST: Masslike consolidation in the left lower lobe, better measured on 06/19/2022, SUV max 13.2. Focal hypermetabolism in the mid esophagus, just below the level of the carina, SUV max 7.5. No definite CT correlate. Focal intercostal uptake between the posterior left second third ribs, SUV max 4.3, corresponding to an 8 mm extrapleural lymph node (4/51). Small left axillary lymph nodes do not show metabolism above blood pool. Incidental CT findings: Thyroidectomy. Atherosclerotic calcification of the aorta and coronary arteries. Heart is at the upper limits of normal in size. Obstructed left lower lobe bronchus with new collapse/consolidation in the left lower lobe. Centrilobular and paraseptal emphysema. No pericardial effusion. Small left pleural effusion. ABDOMEN/PELVIS: Hypermetabolic bilateral adrenal masses. Index left adrenal mass measures 3.6 x 4.2 cm, SUV max 12.2. Nodule hypermetabolism throughout the pancreas. Index nodule in the body of the pancreas, SUV max 10.9. Correlation with CT is challenging without IV or oral contrast. Hypermetabolic nodules are seen in the oblique abdominal wall musculature of the left lower quadrant and lateral to the descending colon with index 11 mm nodule (4/124), SUV max 7.2. Small upper abdominal lymph nodes are hypermetabolic with index gastrohepatic ligament lymph node measuring 7 mm (4/107), SUV max 10.9. No abnormal hypermetabolism in the liver or spleen. Incidental CT findings: Liver is unremarkable. Cholecystectomy. Bilateral adrenal masses are discussed above. Subcentimeter lesion in the right kidney, too small  to characterize. Kidneys, spleen, pancreas and stomach are otherwise unremarkable with the exception of a small hiatal hernia. Right hemicolectomy. Atherosclerotic calcification of the aorta. SKELETON: No abnormal hypermetabolism. Incidental CT findings: Degenerative changes in the spine. IMPRESSION: 1. Stage IV primary bronchogenic carcinoma as evidenced by a hypermetabolic obstructing left lower lobe mass with postobstructive collapse/consolidation in the left lower lobe with the following hypermetabolic metastases: Left extrapleural lymph node, bilateral adrenal masses, pancreatic lesions, upper abdominal lymph nodes and soft tissue nodules in the abdomen and abdominal wall. 2. Focal hypermetabolism in the mid esophagus, without a definite CT correlate. Consider esophagram in further evaluation, as clinically indicated, as malignancy cannot be excluded. 3. Small left pleural effusion. 4. Aortic atherosclerosis (ICD10-I70.0). Coronary artery calcification. 5.  Emphysema (ICD10-J43.9). Electronically Signed   By: Lorin Picket M.D.   On: 07/28/2022 14:10   DG CHEST PORT 1 VIEW  Result Date: 07/24/2022 CLINICAL DATA:  Status post bronchoscopy and biopsy EXAM: PORTABLE CHEST 1 VIEW COMPARISON:  In 06/03/2022, 06/19/2022 FINDINGS: Normal heart size. Known left hilar mass,  better seen by recent CT. No new airspace consolidation. No pleural effusion or pneumothorax. IMPRESSION: No pneumothorax following bronchoscopy. Electronically Signed   By: Davina Poke D.O.   On: 07/24/2022 12:35    ASSESSMENT AND PLAN: This is a very pleasant 61 years old white male with Extensive stage (T4, N3, M1 C) small cell lung cancer presented with obstructing left lower lobe lung mass with left extrapleural lymph node, bilateral adrenal metastasis, pancreatic lesion as well as upper abdominal lymphadenopathy and soft tissue nodules in the abdominal wall as well as the abdomen diagnosed in December 2023. The patient started the  first cycle of his systemic chemotherapy with carboplatin for AUC of 5 on day 1 and etoposide 100 Mg/M2 on days 1, 2 and 3 with Cosela before the chemotherapy and Imfinzi 1500 Mg IV on day 1 every 3 weeks.  Status post 1 cycle given on August 10, 2022. The patient had significant fatigue and weakness as well as lack of appetite and dehydration over the last several days.  He was here today for routine evaluation.  His blood pressure was very low with tachycardia.  The patient was also shortness of breath at baseline increased with exertion with productive cough.  He had no fever or chills. We started the patient on IV hydration with normal saline and Zofran 8 mg IV in the clinic.  Comprehensive metabolic panel results were available later today and that showed acute renal insufficiency with serum creatinine of 5.66. I discussed the result with the patient and his sister and recommended for him to go immediately to the emergency room for further evaluation and probably admission for management of his acute renal failure. He will need aggressive hydration during his hospitalization and also evaluation by the dietitian for his malnutrition. His next treatment scheduled to be done in around 2 weeks and hopefully he will be much better by that time. He will come back for follow-up visit by that time. The patient was advised to call immediately if he has any other concerning issues after discharge from the hospital. The patient voices understanding of current disease status and treatment options and is in agreement with the current care plan.  All questions were answered. The patient knows to call the clinic with any problems, questions or concerns. We can certainly see the patient much sooner if necessary.  The total time spent in the appointment was 40 minutes.  Disclaimer: This note was dictated with voice recognition software. Similar sounding words can inadvertently be transcribed and may not be  corrected upon review.

## 2022-08-19 NOTE — Assessment & Plan Note (Signed)
Mild without obvious bleeding.  Continue to monitor.

## 2022-08-19 NOTE — ED Notes (Signed)
Unable to find vein for labs. Pt stated he usually has ultrasound IV placed or use his port.

## 2022-08-19 NOTE — Assessment & Plan Note (Signed)
Antihypertensives on hold due to hypotension from hypovolemia.

## 2022-08-19 NOTE — Assessment & Plan Note (Signed)
Known obstructing left lower lobe lung mass with bilateral adrenal metastasis, pancreatic lesion, lymphadenopathy.  Recently started chemotherapy 12/18.  Has possible superimposed postobstructive pneumonia. -Continue empiric IV ceftriaxone and azithromycin for now

## 2022-08-19 NOTE — Patient Instructions (Signed)
Rehydration, Adult Rehydration is the replacement of fluids, salts, and minerals in the body (electrolytes) that are lost during dehydration. Dehydration is when there is not enough water or other fluids in the body. This happens when you lose more fluids than you take in. Common causes of dehydration include: Not drinking enough fluids. This can occur when you are ill or doing activities that require a lot of energy, especially in hot weather. Conditions that cause loss of water or other fluids. These include diarrhea, vomiting, sweating, and urinating a lot. Other illnesses, such as fever or infection. Certain medicines, such as those that remove excess fluid from the body (diuretics). Symptoms of mild or moderate dehydration may include thirst, dry lips and mouth, and dizziness. Symptoms of severe dehydration may include increased heart rate, confusion, fainting, and not urinating. In severe cases, you may need to get fluids through an IV at the hospital. For mild or moderate cases, you can usually rehydrate at home by drinking certain fluids as told by your health care provider. What are the risks? Your health care provider will talk with you about risks. Your health care provider will talk with you about risks. This may include taking in too much fluid (overhydration). This is rare. Overhydration can cause an imbalance of electrolytes in the body, kidney failure, or a decrease in salt (sodium) levels in the body. Supplies needed: You will need an oral rehydration solution (ORS) if your health care provider tells you to use one. This is a drink to treat dehydration. It can be found in pharmacies and retail stores. How to rehydrate Fluids Follow instructions from your health care provider about what to drink. The kind of fluid and the amount you should drink depend on your condition. In general, you should choose drinks that you prefer. If told by your health care provider, drink an ORS. Make an  ORS by following instructions on the package. Start by drinking small amounts, about  cup (120 mL) every 5-10 minutes. Slowly increase how much you drink until you have taken in the amount recommended by your health care provider. Drink enough clear fluids to keep your urine pale yellow. If you were told to drink an ORS, finish it first, then start slowly drinking other clear fluids. Drink fluids such as: Water. This includes sparkling and flavored water. Drinking only water can lead to having too little sodium in your body (hyponatremia). Follow the advice of your health care provider. Water from ice chips you suck on. Fruit juice with water added to it (diluted). Sports drinks. Hot or cold herbal teas. Broth-based soups. Milk or milk products. Food Follow instructions from your health care provider about what to eat while you rehydrate. Your health care provider may recommend that you slowly begin eating regular foods in small amounts. Eat foods that contain a healthy balance of electrolytes, such as bananas, oranges, potatoes, tomatoes, and spinach. Avoid foods that are greasy or contain a lot of sugar. In some cases, you may get nutrition through a feeding tube that is passed through your nose and into your stomach (nasogastric tube, or NG tube). This may be done if you have uncontrolled vomiting or diarrhea. Drinks to avoid  Certain drinks may make dehydration worse. While you rehydrate, avoid drinking alcohol. How to tell if you are recovering from dehydration You may be getting better if: You are urinating more often than before you started rehydrating. Your urine is pale yellow. Your energy level improves. You vomit less  often. You have diarrhea less often. Your appetite improves or returns to normal. You feel less dizzy or light-headed. Your skin tone and color start to look more normal. Follow these instructions at home: Take over-the-counter and prescription medicines only  as told by your health care provider. Do not take sodium tablets. Doing this can lead to having too much sodium in your body (hypernatremia). Contact a health care provider if: You continue to have symptoms of mild or moderate dehydration, such as: Thirst. Dry lips. Slightly dry mouth. Dizziness. Dark urine or less urine than normal. Muscle cramps. You continue to vomit or have diarrhea. Get help right away if: You have symptoms of dehydration that get worse. You have a fever. You have a severe headache. You have been vomiting and have problems, such as: Your vomiting gets worse or does not go away. Your vomit includes blood or green matter (bile). You cannot eat or drink without vomiting. You have problems with urination or bowel movements, such as: Diarrhea that gets worse or does not go away. Blood in your stool (feces). This may cause stool to look black and tarry. Not urinating, or urinating only a small amount of very dark urine, within 6-8 hours. You have trouble breathing. You have symptoms that get worse with treatment. These symptoms may be an emergency. Get help right away. Call 911. Do not wait to see if the symptoms will go away. Do not drive yourself to the hospital. This information is not intended to replace advice given to you by your health care provider. Make sure you discuss any questions you have with your health care provider. Document Revised: 12/22/2021 Document Reviewed: 12/22/2021 Elsevier Patient Education  [place].   Influenza (Flu) Vaccine (Inactivated or Recombinant): What You Need to Know 1. Why get vaccinated? Influenza vaccine can prevent influenza (flu). Flu is a contagious disease that spreads around the [place] every year, usually between October and May. Anyone can get the flu, but it is more dangerous for some people. Infants and young children, people 56 years and older, pregnant people, and people with certain health  conditions or a weakened immune system are at greatest risk of flu complications. Pneumonia, bronchitis, sinus infections, and ear infections are examples of flu-related complications. If you have a medical condition, such as heart disease, cancer, or diabetes, flu can make it worse. Flu can cause fever and chills, sore throat, muscle aches, fatigue, cough, headache, and runny or stuffy nose. Some people may have vomiting and diarrhea, though this is more common in children than adults. In an average year, thousands of people in the [place] die from flu, and many more are hospitalized. Flu vaccine prevents millions of illnesses and flu-related visits to the doctor each year. 2. Influenza vaccines CDC recommends everyone 6 months and older get vaccinated every flu season. Children 6 months through 25 years of age may need 2 doses during a single flu season. Everyone else needs only 1 dose each flu season. It takes about 2 weeks for protection to develop after vaccination. There are many flu viruses, and they are always changing. Each year a new flu vaccine is made to protect against the influenza viruses believed to be likely to cause disease in the upcoming flu season. Even when the vaccine doesn't exactly match these viruses, it may still provide some protection. Influenza vaccine does not cause flu. Influenza vaccine may be given at the same time as other vaccines. 3. Talk with your  health care provider Tell your vaccination provider if the person getting the vaccine: Has had an allergic reaction after a previous dose of influenza vaccine, or has any severe, life-threatening allergies Has ever had Guillain-Barr Syndrome (also called "GBS") In some cases, your health care provider may decide to postpone influenza vaccination until a future visit. Influenza vaccine can be administered at any time during pregnancy. People who are or will be pregnant during influenza season should receive  inactivated influenza vaccine. People with minor illnesses, such as a cold, may be vaccinated. People who are moderately or severely ill should usually wait until they recover before getting influenza vaccine. Your health care provider can give you more information. 4. Risks of a vaccine reaction Soreness, redness, and swelling where the shot is given, fever, muscle aches, and headache can happen after influenza vaccination. There may be a very small increased risk of Guillain-Barr Syndrome (GBS) after inactivated influenza vaccine (the flu shot). Young children who get the flu shot along with pneumococcal vaccine (PCV13) and/or DTaP vaccine at the same time might be slightly more likely to have a seizure caused by fever. Tell your health care provider if a child who is getting flu vaccine has ever had a seizure. People sometimes faint after medical procedures, including vaccination. Tell your provider if you feel dizzy or have vision changes or ringing in the ears. As with any medicine, there is a very remote chance of a vaccine causing a severe allergic reaction, other serious injury, or death. 5. What if there is a serious problem? An allergic reaction could occur after the vaccinated person leaves the clinic. If you see signs of a severe allergic reaction (hives, swelling of the face and throat, difficulty breathing, a fast heartbeat, dizziness, or weakness), call 9-1-1 and get the person to the nearest hospital. For other signs that concern you, call your health care provider. Adverse reactions should be reported to the Vaccine Adverse Event Reporting System (VAERS). Your health care provider will usually file this report, or you can do it yourself. Visit the VAERS website at www.vaers.SamedayNews.es or call 956 555 6953. VAERS is only for reporting reactions, and VAERS staff members do not give medical advice. 6. The National Vaccine Injury Compensation Program The [employer] Vaccine Injury  Compensation Program (VICP) is a federal program that was created to compensate people who may have been injured by certain vaccines. Claims regarding alleged injury or death due to vaccination have a time limit for filing, which may be as short as two years. Visit the VICP website at GoldCloset.com.ee or call 256-037-5571 to learn about the program and about filing a claim. 7. How can I learn more? Ask your health care provider. Call your local or state health department. Visit the website of the Food and Drug Administration (FDA) for vaccine package inserts and additional information at TraderRating.uy. Contact the Centers for Disease Control and Prevention (CDC): Call 6144480781 (1-800-CDC-INFO) or Visit CDC's website at https://gibson.com/. Source: CDC Vaccine Information Statement Inactivated Influenza Vaccine (03/29/2020) This same material is available at http://www.wolf.info/ for no charge. This information is not intended to replace advice given to you by your health care provider. Make sure you discuss any questions you have with your health care provider. Document Revised: 07/08/2021 Document Reviewed: 05/01/2021 Elsevier Patient Education  [place].

## 2022-08-19 NOTE — Assessment & Plan Note (Signed)
BUN 97 with creatinine 5.66 (previous baseline 1.2-1.4) in setting of significant hypovolemia/dehydration due to poor oral intake and failure to thrive associated with elevated uric acid after recent initiation of chemotherapy. -Continue IV normal saline hydration overnight -Obtain renal ultrasound -Obtain urine studies, strict I/Os with bladder scan prn -Hold lisinopril, NSAIDs, other nephrotoxic agents

## 2022-08-19 NOTE — Sepsis Progress Note (Signed)
Code sepsis protocol being monitored by eLink. 

## 2022-08-19 NOTE — ED Provider Notes (Signed)
[facility] DEPT Provider Note   CSN: [id number] Arrival date & time: 08/19/22  1405     History  Chief Complaint  Patient presents with   Abnormal Lab    Brett Campbell is a 61 y.o. male.  61 yo M with a cc of decreased oral intake, nausea.  Going on for about 4 days now.  Has been coughing but not worse than baseline.  Nausea no vomiting.  Very little intake over past few days.  Seen in cancer center today found to be hypotensive, tachycardic, renal failure.  Sent here for admission.    Abnormal Lab      Home Medications Prior to Admission medications   Medication Sig Start Date End Date Taking? Authorizing Provider  amLODipine (NORVASC) 5 MG tablet Take 1 tablet (5 mg total) by mouth daily. 09/23/21   [name], NP  atorvastatin (LIPITOR) 10 MG tablet TAKE 1 TABLET EVERY DAY 07/15/22   [name], NP  calcitRIOL (ROCALTROL) 0.25 MCG capsule Take 1 capsule (0.25 mcg total) by mouth daily. 12/26/21   Shamleffer, Melanie Crazier, MD  Calcium Carbonate-Vit D-Min (CALTRATE 600+D PLUS MINERALS) 600-800 MG-UNIT CHEW Chew 1,200 mg by mouth 2 (two) times daily. Patient taking differently: Chew 2 tablets by mouth 2 (two) times daily. 11/22/20   Shamleffer, Melanie Crazier, MD  colestipol (COLESTID) 1 g tablet Take 1 tablet (1 g total) by mouth 2 (two) times daily. Patient taking differently: Take 1 g by mouth daily. 06/25/22   Thornton Park, MD  guaifenesin (MUCUS RELIEF CHEST CONGESTION) 400 MG TABS tablet Take 400 mg by mouth every 4 (four) hours as needed (cough).    [provider]  ibuprofen (ADVIL) 200 MG tablet Take 400-800 mg by mouth every 6 (six) hours as needed for moderate pain.    [provider]  levothyroxine (SYNTHROID) 150 MCG tablet Take 1 tablet (150 mcg total) by mouth daily. 12/26/21   Shamleffer, Melanie Crazier, MD  lidocaine-prilocaine (EMLA) cream Apply to affected area once 08/10/22   Curt Bears, MD  lisinopril (ZESTRIL) 10 MG tablet TAKE 1 TABLET EVERY DAY 07/15/22   Boscia, Greer Ee, NP  ondansetron (ZOFRAN) 8 MG tablet Take 1 tablet (8 mg total) by mouth every 8 (eight) hours as needed for nausea, vomiting or refractory nausea / vomiting. Start on the third day after carboplatin. 08/10/22   Curt Bears, MD  potassium chloride SA (KLOR-CON M) 20 MEQ tablet TAKE 1 TABLET EVERY DAY 07/15/22   [name], NP  prochlorperazine (COMPAZINE) 10 MG tablet Take 1 tablet (10 mg total) by mouth every 6 (six) hours as needed. 07/31/22   Heilingoetter, Cassandra L, PA-C      Allergies    Penicillins    Review of Systems   Review of Systems  Physical Exam Updated Vital Signs BP (!) 107/91   Pulse 94   Temp 98 F (36.7 C) (Oral)   Resp (!) 26   Ht 5\' 10"  (1.778 m)   Wt 77.1 kg   SpO2 95%   BMI 24.39 kg/m  Physical Exam Vitals and nursing note reviewed.  Constitutional:      Appearance: He is well-developed.     Comments: Chronically ill appearing.   HENT:     Head: Normocephalic and atraumatic.  Eyes:     Pupils: Pupils are equal, round, and reactive to light.  Neck:     Vascular: No JVD.  Cardiovascular:  Rate and Rhythm: Regular rhythm. Tachycardia present.     Heart sounds: No murmur heard.    No friction rub. No gallop.  Pulmonary:     Effort: No respiratory distress.     Breath sounds: No wheezing.     Comments: tachypnea Abdominal:     General: There is no distension.     Tenderness: There is no abdominal tenderness. There is no guarding or rebound.  Musculoskeletal:        General: Normal range of motion.     Cervical back: Normal range of motion and neck supple.  Skin:    Coloration: Skin is not pale.     Findings: No rash.  Neurological:     Mental Status: He is alert and oriented to person, place, and time.  Psychiatric:        Behavior: Behavior normal.     ED Results / Procedures / Treatments   Labs (all labs ordered are  listed, but only abnormal results are displayed) Labs Reviewed  LIPASE, BLOOD - Abnormal; Notable for the following components:      Result Value   Lipase 55 (*)    All other components within normal limits  URIC ACID - Abnormal; Notable for the following components:   Uric Acid, Serum 11.3 (*)    All other components within normal limits  PHOSPHORUS - Abnormal; Notable for the following components:   Phosphorus 6.3 (*)    All other components within normal limits  PROTIME-INR - Abnormal; Notable for the following components:   Prothrombin Time 15.5 (*)    All other components within normal limits  I-STAT CHEM 8, ED - Abnormal; Notable for the following components:   Sodium 127 (*)    Potassium 5.4 (*)    BUN 111 (*)    Creatinine, Ser 7.20 (*)    Glucose, Bld 140 (*)    Calcium, Ion 0.81 (*)    TCO2 14 (*)    Hemoglobin 10.9 (*)    HCT 32.0 (*)    All other components within normal limits  RESP PANEL BY RT-PCR (RSV, FLU A&B, COVID)  RVPGX2  CULTURE, BLOOD (ROUTINE X 2)  CULTURE, BLOOD (ROUTINE X 2)  LACTATE DEHYDROGENASE  LACTIC ACID, PLASMA  APTT  LACTIC ACID, PLASMA  URINALYSIS, ROUTINE W REFLEX MICROSCOPIC    EKG None  Radiology DG Chest Port 1 View  Result Date: 08/19/2022 CLINICAL DATA:  Cough, tachypnea, history of lung cancer EXAM: PORTABLE CHEST 1 VIEW COMPARISON:  07/24/2022, 07/27/2022 FINDINGS: Single frontal view of the chest demonstrates right chest wall port via internal jugular approach tip overlying the atriocaval junction. Complete consolidation of the left lower lobe consistent with known central obstructing mass and postobstructive change. There is some increasing ground-glass consolidation within the left lateral lung base, which could reflect superimposed left upper lobe pneumonia. No acute bony abnormality. IMPRESSION: 1. Stable left lower lobe consolidation consistent with known central obstructing malignancy and postobstructive consolidation. 2. New  ground-glass consolidation at the left lateral lung base, which could reflect aspiration or infection within the left upper lobe. Electronically Signed   By: Randa Ngo M.D.   On: 08/19/2022 16:53    Procedures .Critical Care  Performed by: Deno Etienne, DO Authorized by: Deno Etienne, DO   Critical care provider statement:    Critical care time (minutes):  35   Critical care time was exclusive of:  Separately billable procedures and treating other patients   Critical care was time spent  personally by me on the following activities:  Development of treatment plan with patient or surrogate, discussions with consultants, evaluation of patient's response to treatment, examination of patient, ordering and review of laboratory studies, ordering and review of radiographic studies, ordering and performing treatments and interventions, pulse oximetry, re-evaluation of patient's condition and review of old charts   Care discussed with: admitting provider       Medications Ordered in ED Medications  lactated ringers infusion ( Intravenous New Bag/Given 08/19/22 1655)  lactated ringers infusion ( Intravenous New Bag/Given 08/19/22 1655)  sodium chloride flush (NS) 0.9 % injection 10-40 mL (has no administration in time range)  Chlorhexidine Gluconate Cloth 2 % PADS 6 each (has no administration in time range)  lactated ringers bolus 1,000 mL (1,000 mLs Intravenous New Bag/Given 08/19/22 1651)  ondansetron (ZOFRAN) injection 4 mg (4 mg Intravenous Given 08/19/22 1510)  cefTRIAXone (ROCEPHIN) 2 g in sodium chloride 0.9 % 100 mL IVPB (0 g Intravenous Stopped 08/19/22 1745)  azithromycin (ZITHROMAX) 500 mg in sodium chloride 0.9 % 250 mL IVPB (0 mg Intravenous Stopped 08/19/22 1852)  lactated ringers bolus 1,500 mL (1,500 mLs Intravenous New Bag/Given 08/19/22 1655)  calcium gluconate 1 g/ 50 mL sodium chloride IVPB (1,000 mg Intravenous New Bag/Given 08/19/22 1855)    ED Course/ Medical Decision  Making/ A&P                           Medical Decision Making Amount and/or Complexity of Data Reviewed Labs: ordered. Radiology: ordered. ECG/medicine tests: ordered.  Risk OTC drugs. Prescription drug management. Decision regarding hospitalization.   61 yo M with a cc of nausea, decreased oral intake.  Seen at cancer center and found to have new renal failure.  Likely prerenal based on history.  Will give fluids.  Tachypnea on exam, feels like baseline per family, covid flu panel, cxr.    Chest x-ray interpreted by me with a left lower lobe pneumonia.  Code sepsis was initiated with hypotension and pneumonia.  Patient's blood pressure has trended upwards with IV fluids.  Lactate normal.  Repeat i-STAT here consistent with blood work obtained earlier with acute kidney injury.  I discussed the case with Dr. Melvia Heaps, nephrology agreed with hydration therapy.  Felt reasonable to keep here at [facility] long.  Will discuss with medicine for admission.  The patients results and plan were reviewed and discussed.   Any x-rays performed were independently reviewed by myself.   Differential diagnosis were considered with the presenting HPI.  Medications  lactated ringers infusion ( Intravenous New Bag/Given 08/19/22 1655)  lactated ringers infusion ( Intravenous New Bag/Given 08/19/22 1655)  sodium chloride flush (NS) 0.9 % injection 10-40 mL (has no administration in time range)  Chlorhexidine Gluconate Cloth 2 % PADS 6 each (has no administration in time range)  lactated ringers bolus 1,000 mL (1,000 mLs Intravenous New Bag/Given 08/19/22 1651)  ondansetron (ZOFRAN) injection 4 mg (4 mg Intravenous Given 08/19/22 1510)  cefTRIAXone (ROCEPHIN) 2 g in sodium chloride 0.9 % 100 mL IVPB (0 g Intravenous Stopped 08/19/22 1745)  azithromycin (ZITHROMAX) 500 mg in sodium chloride 0.9 % 250 mL IVPB (0 mg Intravenous Stopped 08/19/22 1852)  lactated ringers bolus 1,500 mL (1,500 mLs Intravenous New  Bag/Given 08/19/22 1655)  calcium gluconate 1 g/ 50 mL sodium chloride IVPB (1,000 mg Intravenous New Bag/Given 08/19/22 1855)    Vitals:   08/19/22 1845 08/19/22 1847 08/19/22 1852 08/19/22 1900  BP: (!) 106/54 (!) 106/54  (!) 107/91  Pulse: 91 75  94  Resp: 18 16  (!) 26  Temp:   98 F (36.7 C)   TempSrc:   Oral   SpO2: 95% 96%  95%  Weight:      Height:        Final diagnoses:  Pneumonia of left lower lobe due to infectious organism  Septic shock ([place])    Admission/ observation were discussed with the admitting physician, patient and/or family and they are comfortable with the plan.          Final Clinical Impression(s) / ED Diagnoses Final diagnoses:  Pneumonia of left lower lobe due to infectious organism  Septic shock [facility])    Rx / DC Orders ED Discharge Orders     None         Deno Etienne, DO 08/19/22 1958

## 2022-08-19 NOTE — ED Notes (Signed)
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  He has not voided since arrival to the ED. Patient is agreeable to use of external catheter to prevent exertion when trying to use a urinal.  Abdomen is soft.  Bowel sounds are present.  Family reports he has had diarrhea which causes some incontinence.

## 2022-08-19 NOTE — Hospital Course (Signed)
Brett Campbell is a 61 y.o. 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

## 2022-08-19 NOTE — H&P (Signed)
History and Physical    Brett Campbell QIH:[id number] DOB: 1960/08/25 DOA: 08/19/2022  PCP: [name], NP  Patient coming from: Home  I have personally briefly reviewed patient's old medical records in [place]  Chief Complaint: Dehydration, renal dysfunction  HPI: Brett Campbell is a 61 y.o. male with medical history significant for stage IV small cell lung cancer (with obstructing left lower lobe lung mass, bilateral adrenal metastasis, pancreatic lesion, upper abdominal lymphadenopathy and soft tissue nodules in the abdominal wall) on active chemotherapy, COPD, HTN, HLD, hypothyroidism, deafness who presented to the ED from the cancer center for evaluation of AKI associated with hypovolemia and failure to thrive.  Patient has known metastatic small cell lung cancer with an obstructing left lower lobe lung mass.  He started systemic chemotherapy with carboplatin, etoposide, Durvalumab, and Cosela on August 10, 2022.  Since then he has been having increasing fatigue, generalized weakness, poor appetite, nausea without emesis, and several episodes of diarrhea.  He has had persistent shortness of breath at baseline, worse with exertion.  He has had chills.  He has been feeling dehydrated.  He was seen in oncology clinic for follow-up and was noted to be hypotensive and tachycardic.  He was found to be dehydrated.  He was given IV fluids and Zofran in the clinic.  Labs were obtained and showed acute kidney injury with serum creatinine up to 5.66 compared to 1.47 on 08/10/2022.  He was advised to go to the ED for further evaluation and management.  ED Course  Labs/Imaging on admission: I have personally reviewed following labs and imaging studies.  Initial vitals showed BP 81/46, pulse 100, RR 16, temp 97.5 F, SpO2 94% on room air.  Labs from outpatient clinic earlier today showed BUN 97, creatinine 5.66 (previously 1.47 on 08/10/2022), sodium 126, potassium  5.0, bicarb 16, serum glucose 164,, calcium 7.4, albumin 3.4, WBC 4.4, hemoglobin 11.9, platelets 100,000.  Further labs in the ED showed lipase 55, LDH 192, uric acid 11.3, phosphorus 6.3, lactic acid 0.8.  COVID, influenza, RSV PCR negative.  Blood cultures ordered and pending.  Portable chest x-ray shows stable left lower lung consolidation with new groundglass consolidation at the left lateral lung base.  Patient was given 2.5 L LR, IV calcium gluconate 1 g, IV ceftriaxone and azithromycin.  EDP discussed with neurologist, Dr. Melvia Heaps, who recommended continued hydration, can admit to [facility] long.  The hospitalist service was consulted to admit for further evaluation and management.  Review of Systems:  All systems reviewed and are negative except as documented in history of present illness above.   Past Medical History:  Diagnosis Date   Calcium deficiency    Cataract    COPD (chronic obstructive pulmonary disease) ([place])    moderate emphysema   Deaf    Hypertension    Hypothyroidism    Personal history of colonic polyps 11/03/2020   Pneumonia    August 2023   Scarlet fever    Sleep apnea    never used a Cpap, used to use O2   Thyroid disease    Tuberculosis    [place] Health department is contact with him due to hx.    Past Surgical History:  Procedure Laterality Date   CHOLECYSTECTOMY     COLONOSCOPY     +5years   IR IMAGING GUIDED PORT INSERTION  08/14/2022   LAPAROSCOPIC RIGHT HEMI COLECTOMY N/A 02/26/2021   Procedure: LAPAROSCOPIC RIGHT HEMI COLECTOMY;  Surgeon: Ileana Roup,  MD;  Location: [facility];  Service: General;  Laterality: N/A;   THYROIDECTOMY     VIDEO BRONCHOSCOPY WITH ENDOBRONCHIAL ULTRASOUND N/A 07/24/2022   Procedure: VIDEO BRONCHOSCOPY WITH ENDOBRONCHIAL ULTRASOUND  WITH LEFT LOWER LOBE  MASS BIOPSIES;  Surgeon: [name], MD;  Location: [place];  Service: Pulmonary;  Laterality: N/A;    Social History:  reports that he has been smoking  cigarettes. He has a 11.25 pack-year smoking history. He has never used smokeless tobacco. He reports current alcohol use of about 1.0 - 2.0 standard drink of alcohol per week. He reports current drug use. Drug: Marijuana.  Allergies  Allergen Reactions   Penicillins     Unknown  Did it involve swelling of the face/tongue/throat, SOB, or low BP? Unknown Did it involve sudden or severe rash/hives, skin peeling, or any reaction on the inside of your mouth or nose? Unknown Did you need to seek medical attention at a hospital or doctor's office? Unknown When did it last happen?     childhood  If all above answers are "NO", may proceed with cephalosporin use.      Family History  Problem Relation Age of Onset   CAD Mother    Diabetes Mellitus II Mother    Diabetes Mother    CAD Father    Heart attack Father    Heart disease Father    Diabetes Sister    Colon cancer Neg Hx    Esophageal cancer Neg Hx    Stomach cancer Neg Hx    Colon polyps Neg Hx      Prior to Admission medications   Medication Sig Start Date End Date Taking? Authorizing Provider  amLODipine (NORVASC) 5 MG tablet Take 1 tablet (5 mg total) by mouth daily. 09/23/21   [name], NP  atorvastatin (LIPITOR) 10 MG tablet TAKE 1 TABLET EVERY DAY 07/15/22   [name], NP  calcitRIOL (ROCALTROL) 0.25 MCG capsule Take 1 capsule (0.25 mcg total) by mouth daily. 12/26/21   Shamleffer, Melanie Crazier, MD  Calcium Carbonate-Vit D-Min (CALTRATE 600+D PLUS MINERALS) 600-800 MG-UNIT CHEW Chew 1,200 mg by mouth 2 (two) times daily. Patient taking differently: Chew 2 tablets by mouth 2 (two) times daily. 11/22/20   Shamleffer, Melanie Crazier, MD  colestipol (COLESTID) 1 g tablet Take 1 tablet (1 g total) by mouth 2 (two) times daily. Patient taking differently: Take 1 g by mouth daily. 06/25/22   Thornton Park, MD  guaifenesin (MUCUS RELIEF CHEST CONGESTION) 400 MG TABS tablet Take 400 mg by mouth every 4 (four)  hours as needed (cough).    [provider]  ibuprofen (ADVIL) 200 MG tablet Take 400-800 mg by mouth every 6 (six) hours as needed for moderate pain.    [provider]  levothyroxine (SYNTHROID) 150 MCG tablet Take 1 tablet (150 mcg total) by mouth daily. 12/26/21   Shamleffer, Melanie Crazier, MD  lidocaine-prilocaine (EMLA) cream Apply to affected area once 08/10/22   Curt Bears, MD  lisinopril (ZESTRIL) 10 MG tablet TAKE 1 TABLET EVERY DAY 07/15/22   Boscia, Greer Ee, NP  ondansetron (ZOFRAN) 8 MG tablet Take 1 tablet (8 mg total) by mouth every 8 (eight) hours as needed for nausea, vomiting or refractory nausea / vomiting. Start on the third day after carboplatin. 08/10/22   Curt Bears, MD  potassium chloride SA (KLOR-CON M) 20 MEQ tablet TAKE 1 TABLET EVERY DAY 07/15/22   [name], NP  prochlorperazine (COMPAZINE) 10 MG  tablet Take 1 tablet (10 mg total) by mouth every 6 (six) hours as needed. 07/31/22   Heilingoetter, Cassandra L, PA-C    Physical Exam: Vitals:   08/19/22 1852 08/19/22 1900 08/19/22 2015 08/19/22 2022  BP:  (!) 107/91 (!) 107/55   Pulse:  94 97   Resp:  (!) 26 (!) 26   Temp: 98 F (36.7 C)   98.2 F (36.8 C)  TempSrc: Oral     SpO2:  95% 95%   Weight:      Height:       Constitutional: Chronically ill-appearing man resting in bed with head elevated, NAD, calm, comfortable Eyes: EOMI, lids and conjunctivae normal ENMT: Mucous membranes are dry. Posterior pharynx clear of any exudate or lesions.Normal dentition.  Deaf. Neck: normal, supple, no masses. Respiratory: Diminished breath sounds left lower lung field. Normal respiratory effort. No accessory muscle use.  Cardiovascular: Regular rate and rhythm, no murmurs / rubs / gallops. No extremity edema. 2+ pedal pulses. Abdomen: no tenderness, no masses palpated. Musculoskeletal: no clubbing / cyanosis. No joint deformity upper and lower extremities. Good ROM, no contractures.  Normal muscle tone.  Skin: no rashes, lesions, ulcers. No induration Neurologic: Sensation intact. Strength 5/5 in all 4.  Psychiatric: Alert and oriented x 3.   EKG: Personally reviewed. Sinus rhythm, rate 98, low voltage, rate is faster when compared to prior.  Assessment/Plan Principal Problem:   Acute renal failure (HCC) Active Problems:   Hyponatremia   Hyperuricemia   Small cell lung cancer (HCC)   Acquired hypothyroidism   Essential hypertension   Mixed hyperlipidemia   Hypotension   Hyperphosphatemia   Thrombocytopenia ([place])   Brett Campbell is a 61 y.o. 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  Assessment and Plan: * Acute renal failure (HCC) BUN 97 with creatinine 5.66 (previous baseline 1.2-1.4) in setting of significant hypovolemia/dehydration due to poor oral intake and failure to thrive associated with elevated uric acid after recent initiation of chemotherapy. -Continue IV normal saline hydration overnight -Obtain renal ultrasound -Obtain urine studies, strict I/Os with bladder scan prn -Hold lisinopril, NSAIDs, other nephrotoxic agents  Hyponatremia Hyperphosphatemia Hyperuricemia Secondary to volume depletion with associated AKI with possible tumor lysis syndrome in setting of recent chemotherapy initiation. -Continue IV normal saline hydration overnight -Give 1 dose IV rasburicase tonight, repeat uric acid level in a.m.  Small cell lung cancer (HCC) Known obstructing left lower lobe lung mass with bilateral adrenal metastasis, pancreatic lesion, lymphadenopathy.   Recently started chemotherapy 12/18.  Has possible superimposed postobstructive pneumonia. -Continue empiric IV ceftriaxone and azithromycin for now  Thrombocytopenia (HCC) Mild without obvious bleeding.  Continue to monitor.  Essential hypertension Antihypertensives on hold due to hypotension from hypovolemia.  Acquired hypothyroidism Continue Synthroid.  Last TSH 0.642 on 08/10/2022.  DVT prophylaxis: SCDs Start: 08/19/22 2013 Code Status: Full code, confirmed on admission Family Communication: Sister at bedside Disposition Plan: From home, dispo pending clinical progress Consults called: None Severity of Illness: The appropriate patient status for this patient is INPATIENT. Inpatient status is judged to be reasonable and necessary in order to provide the required intensity of service to ensure the patient's safety. The patient's presenting symptoms,  physical exam findings, and initial radiographic and laboratory data in the context of their chronic comorbidities is felt to place them at high risk for further clinical deterioration. Furthermore, it is not anticipated that the patient will be medically stable for discharge from the hospital within 2 midnights of admission.   * I certify that at the point of admission it is my clinical judgment that the patient will require inpatient hospital care spanning beyond 2 midnights from the point of admission due to high intensity of service, high risk for further deterioration and high frequency of surveillance required.Zada Finders MD [facility]  If 7PM-7AM, please contact night-coverage [email or web address]  08/19/2022, 8:49 PM

## 2022-08-19 NOTE — ED Triage Notes (Signed)
Pt sent from cancer center for eval of renal failure per abnormal lab results today. Pt was hypotensive on arrival. He has a new port, freshly placed, accessed on arrival, and received a liter of normal saline PTA in ED. Pt is deaf but his mother is here with him and can assist with translation. Pt has poor appetite and decreased oral intake, c/o nausea treated with zofran and compazine. Lung cancer pt. BP 81/46 in triage.

## 2022-08-19 NOTE — Assessment & Plan Note (Signed)
Hyperphosphatemia Hyperuricemia Secondary to volume depletion with associated AKI with possible tumor lysis syndrome in setting of recent chemotherapy initiation. -Continue IV normal saline hydration overnight -Give 1 dose IV rasburicase tonight, repeat uric acid level in a.m.

## 2022-08-19 NOTE — ED Provider Triage Note (Addendum)
Emergency Medicine Provider Triage Evaluation Note  Brett Campbell , a 61 y.o. male  was evaluated in triage.  Pt complains of abnormal labs. Received first chemo treatment last week. Last Friday had his port put in. Was found to be hypotensive with AKI. Has had decreased PO intake recently.  Review of Systems  Positive: Sweats, cough, decreased urination, diarrhea, n/v Negative: Fevers  Physical Exam  BP (!) 81/46   Pulse 100   Temp (!) 97.5 F (36.4 C) (Oral)   Resp 16   SpO2 94%  Gen:   Awake, no distress  Resp:  Mildly increased WOB, diminished lung sounds BL, 2L [place] CV:   Tachycardia, no MRG Abd:   No abdominal pain MSK:   Moves extremities without difficulty  Medical Decision Making  Medically screening exam initiated at 2:35 PM.  Appropriate orders placed.  Brett Campbell was informed that the remainder of the evaluation will be completed by another provider, this initial triage assessment does not replace that evaluation, and the importance of remaining in the ED until their evaluation is complete.   Feel the patient likely is hypotensive from volume losses.  Discussed with charge nurse who will bring the patient back immediately to a room.  Will bring back and assess for other possible causes such as sepsis and see if imaging is warranted.  Will give fluids and also check bladder scan for obstruction given the patient's AKI.    Brett Meadow, MD 08/19/22 [phone]

## 2022-08-19 NOTE — Assessment & Plan Note (Signed)
Continue Synthroid.  Last TSH 0.642 on 08/10/2022.

## 2022-08-20 ENCOUNTER — Other Ambulatory Visit: Payer: Medicare HMO

## 2022-08-20 ENCOUNTER — Telehealth: Payer: Self-pay

## 2022-08-20 DIAGNOSIS — N179 Acute kidney failure, unspecified: Secondary | ICD-10-CM | POA: Diagnosis not present

## 2022-08-20 LAB — C DIFFICILE QUICK SCREEN W PCR REFLEX
C Diff antigen: POSITIVE — AB
C Diff toxin: NEGATIVE

## 2022-08-20 LAB — RENAL FUNCTION PANEL
Albumin: 2.4 g/dL — ABNORMAL LOW (ref 3.5–5.0)
Anion gap: 12 (ref 5–15)
BUN: 83 mg/dL — ABNORMAL HIGH (ref 6–20)
CO2: 17 mmol/L — ABNORMAL LOW (ref 22–32)
Calcium: 6.3 mg/dL — CL (ref 8.9–10.3)
Chloride: 103 mmol/L (ref 98–111)
Creatinine, Ser: 4.67 mg/dL — ABNORMAL HIGH (ref 0.61–1.24)
GFR, Estimated: 14 mL/min — ABNORMAL LOW (ref >60)
Glucose, Bld: 183 mg/dL — ABNORMAL HIGH (ref 70–99)
Phosphorus: 4.8 mg/dL — ABNORMAL HIGH (ref 2.5–4.6)
Potassium: 4.3 mmol/L (ref 3.5–5.1)
Sodium: 132 mmol/L — ABNORMAL LOW (ref 135–145)

## 2022-08-20 LAB — RASBURICASE - URIC ACID: Uric Acid, Serum: 2.5 mg/dL — ABNORMAL LOW (ref 3.7–8.6)

## 2022-08-20 LAB — CBC
HCT: 28.5 % — ABNORMAL LOW (ref 39.0–52.0)
Hemoglobin: 9.8 g/dL — ABNORMAL LOW (ref 13.0–17.0)
MCH: 29.6 pg (ref 26.0–34.0)
MCHC: 34.4 g/dL (ref 30.0–36.0)
MCV: 86.1 fL (ref 80.0–100.0)
Platelets: 66 10*3/uL — ABNORMAL LOW (ref 150–400)
RBC: 3.31 MIL/uL — ABNORMAL LOW (ref 4.22–5.81)
RDW: 12.9 % (ref 11.5–15.5)
WBC: 2.1 10*3/uL — ABNORMAL LOW (ref 4.0–10.5)
nRBC: 0 % (ref 0.0–0.2)

## 2022-08-20 LAB — CLOSTRIDIUM DIFFICILE BY PCR, REFLEXED: Toxigenic C. Difficile by PCR: NEGATIVE

## 2022-08-20 LAB — MAGNESIUM: Magnesium: 1.5 mg/dL — ABNORMAL LOW (ref 1.7–2.4)

## 2022-08-20 MED ORDER — PSYLLIUM 95 % PO PACK
1.0000 | PACK | Freq: Every day | ORAL | Status: DC
  Administered 2022-08-20 – 2022-08-22 (×3): 1 via ORAL
  Filled 2022-08-20 (×3): qty 1

## 2022-08-20 MED ORDER — GUAIFENESIN-DM 100-10 MG/5ML PO SYRP
5.0000 mL | ORAL_SOLUTION | ORAL | Status: DC | PRN
  Administered 2022-08-20: 5 mL via ORAL
  Filled 2022-08-20: qty 10

## 2022-08-20 MED ORDER — ENSURE ENLIVE PO LIQD
237.0000 mL | Freq: Two times a day (BID) | ORAL | Status: DC
  Administered 2022-08-21 – 2022-08-22 (×3): 237 mL via ORAL

## 2022-08-20 MED ORDER — CALCIUM GLUCONATE-NACL 1-0.675 GM/50ML-% IV SOLN
1.0000 g | Freq: Once | INTRAVENOUS | Status: AC
  Administered 2022-08-20: 1000 mg via INTRAVENOUS
  Filled 2022-08-20: qty 50

## 2022-08-20 MED ORDER — SODIUM CHLORIDE 0.9 % IV SOLN: INTRAVENOUS | Status: DC

## 2022-08-20 NOTE — Evaluation (Signed)
Physical Therapy Evaluation Patient Details Name: Brett Campbell MRN: [id number] DOB: [birth year] Today's Date: 08/20/2022  History of Present Illness  Pt is a 61yo male presenting to [facility] ED from cancer center where he was tachycardic and hypotensive, complaining of fatigue, weakness, poor appetite, diarrhea, SOB. Found to have AKI with labwork. PMH: stage IV small cell lung cancer on active chemotherapy starting 08/10/22, COPD, HTN, HLD, hypothyroidism, deafness, scarlet fever, TB   Clinical Impression  Pt admitted with above diagnosis. Pt currently with functional limitations due to the deficits listed below (see PT Problem List). At baseline pt lives with his sister who cares for him with iADLs but per pt report he is independent with mobility and personal ADLs and has all necessary DME at home. Pt demonstrated modified independence with bed mobility and transfers, min guard for short-distance ambulation in room limited by shortness of breath, SpO2 via personal pulse ox 95% post-ambulation. Pt will benefit from skilled PT to increase their independence and safety with mobility to allow discharge to the venue listed below.          Recommendations for follow up therapy are one component of a multi-disciplinary discharge planning process, led by the attending physician.  Recommendations may be updated based on patient status, additional functional criteria and insurance authorization.  Follow Up Recommendations Outpatient PT      Assistance Recommended at Discharge Set up Supervision/Assistance  Patient can return home with the following  A little help with walking and/or transfers;A little help with bathing/dressing/bathroom;Assist for transportation;Assistance with cooking/housework;Help with stairs or ramp for entrance    Equipment Recommendations None recommended by PT  Recommendations for Other Services       Functional Status Assessment Patient has had a recent decline in  their functional status and demonstrates the ability to make significant improvements in function in a reasonable and predictable amount of time.     Precautions / Restrictions Precautions Precautions: Fall Restrictions Weight Bearing Restrictions: No      Mobility  Bed Mobility Overal bed mobility: Modified Independent             General bed mobility comments: Increased time    Transfers Overall transfer level: Modified independent Equipment used: None               General transfer comment: Increased time, some sway in standing so provided RW, pt's stability increased.    Ambulation/Gait Ambulation/Gait assistance: Min guard, +2 safety/equipment Gait Distance (Feet): 10 Feet Assistive device: Rolling walker (2 wheels) Gait Pattern/deviations: Step-to pattern, Shuffle Gait velocity: decreased     General Gait Details: Pt ambulated with RW and min guard, +2 for equipment, no physical assist required or overt LOB noted. Pt demonstrated increased work of breathing and reported shortness of breath, SpO2 95% on 4L via [place]. Futher mobility deferred.  Stairs            Wheelchair Mobility    Modified Rankin (Stroke Patients Only)       Balance Overall balance assessment: Needs assistance Sitting-balance support: Feet supported, No upper extremity supported Sitting balance-Leahy Scale: Good     Standing balance support: Reliant on assistive device for balance, During functional activity, Bilateral upper extremity supported Standing balance-Leahy Scale: Poor                               Pertinent Vitals/Pain Pain Assessment Pain Assessment: No/denies pain    Home Living  Family/patient expects to be discharged to:: Private residence Living Arrangements: Other relatives (Sister) Available Help at Discharge: Family;Available 24 hours/day Type of Home: House Home Access: Level entry       Home Layout: Laundry or work area in basement  (25 steps with bilateral rails) Home Equipment: Conservation officer, nature (2 wheels);Cane - single point;BSC/3in1;Shower seat;Wheelchair - manual      Prior Function Prior Level of Function : Independent/Modified Independent             Mobility Comments: IND ADLs Comments: IND     Hand Dominance        Extremity/Trunk Assessment   Upper Extremity Assessment Upper Extremity Assessment: Overall WFL for tasks assessed    Lower Extremity Assessment Lower Extremity Assessment: RLE deficits/detail;LLE deficits/detail RLE Deficits / Details: MMT hip/knee/ankle 5/5, functional ROM RLE Sensation: WNL LLE Deficits / Details: MMT hip/knee/ankle 5/5, functional ROM LLE Sensation: WNL    Cervical / Trunk Assessment Cervical / Trunk Assessment: Normal  Communication   Communication: Deaf;Interpreter utilized (Joy [id number])  Cognition Arousal/Alertness: Awake/alert Behavior During Therapy: WFL for tasks assessed/performed Overall Cognitive Status: Within Functional Limits for tasks assessed                                          General Comments      Exercises     Assessment/Plan    PT Assessment Patient needs continued PT services  PT Problem List Decreased strength;Decreased range of motion;Decreased activity tolerance;Decreased balance;Decreased mobility       PT Treatment Interventions DME instruction;Gait training;Stair training;Functional mobility training;Therapeutic activities;Therapeutic exercise;Balance training;Neuromuscular re-education;Patient/family education    PT Goals (Current goals can be found in the Care Plan section)  Acute Rehab PT Goals Patient Stated Goal: To go home PT Goal Formulation: With patient Time For Goal Achievement: 09/03/22 Potential to Achieve Goals: Good    Frequency Min 3X/week     Co-evaluation               AM-PAC PT "6 Clicks" Mobility  Outcome Measure Help needed turning from your back to your side while  in a flat bed without using bedrails?: None Help needed moving from lying on your back to sitting on the side of a flat bed without using bedrails?: None Help needed moving to and from a bed to a chair (including a wheelchair)?: A Little Help needed standing up from a chair using your arms (e.g., wheelchair or bedside chair)?: A Little Help needed to walk in hospital room?: A Little Help needed climbing 3-5 steps with a railing? : A Little 6 Click Score: 20    End of Session Equipment Utilized During Treatment: Oxygen Activity Tolerance: Patient limited by fatigue Patient left: in chair;with call bell/phone within reach;with chair alarm set Nurse Communication: Mobility status PT Visit Diagnosis: Muscle weakness (generalized) (M62.81);Difficulty in walking, not elsewhere classified (R26.2)    Time: 1517-6160 PT Time Calculation (min) (ACUTE ONLY): 13 min   Charges:   PT Evaluation $PT Eval Low Complexity: 1 Low          Coolidge Breeze, PT, DPT [facility] Office: [phone] Weekend pager: [phone]  Coolidge Breeze 08/20/2022, 11:54 AM

## 2022-08-20 NOTE — Progress Notes (Signed)
Initial Nutrition Assessment  DOCUMENTATION CODES:   Not applicable  INTERVENTION:  - Regular diet.  - Ensure Enlive po BID, each supplement provides 350 kcal and 20 grams of protein. - Encourage intake at all meals and of supplements.  - Monitor weight trends closely.   NUTRITION DIAGNOSIS:   Increased nutrient needs related to chronic illness (stage IV small cell lung cancer with metastasis) as evidenced by estimated needs.  GOAL:   Patient will meet greater than or equal to 90% of their needs  MONITOR:   PO intake, Supplement acceptance, Weight trends  REASON FOR ASSESSMENT:   Consult Assessment of nutrition requirement/status  ASSESSMENT:   61 y.o. male with medical history significant for stage IV small cell lung cancer (with obstructing left lower lobe lung mass, bilateral adrenal metastasis, pancreatic lesion, upper abdominal lymphadenopathy and soft tissue nodules in the abdominal wall) on active chemotherapy, COPD, HTN, HLD, hypothyroidism, deafness who presented to the ED from the cancer center for evaluation of AKI associated with hypovolemia and failure to thrive.  Patient sleeping soundly at time of visit. No family members at bedside.  Per chart review, weight has trended down over the past year from 186# in January to 170# this admission. This is an 8.6% weight loss in the past year, not significant for the time frame. Notably, he has lost 9# in the past 1.5 months.  There has been no intake documented since admission to determine oral intake. Patient is noted to have recently been diagnosed with lung cancer. He would benefit from nutrition supplements to support intake.     Medications reviewed and include: Synthroid, Zofran prn  Labs reviewed:  Na 132 Creatinine 4.67 Phosphorus 4.8 Magnesium 2.5   NUTRITION - FOCUSED PHYSICAL EXAM: Unable to perform at this time  Diet Order:   Diet Order             Diet regular Room service appropriate? Yes;  Fluid consistency: Thin  Diet effective now                   EDUCATION NEEDS:  Not appropriate for education at this time  Skin:  Skin Assessment: Reviewed RN Assessment  Last BM:  12/28  Height:  Ht Readings from Last 1 Encounters:  08/19/22 5\' 10"  (1.778 m)   Weight:  Wt Readings from Last 1 Encounters:  08/19/22 77.1 kg    BMI:  Body mass index is 24.39 kg/m.  Estimated Nutritional Needs:  Kcal:  3612-2449 kcals Protein:  90-115 grams Fluid:  >/= 2.1L    Samson Frederic RD, LDN For contact information, refer to [facility].

## 2022-08-20 NOTE — Telephone Encounter (Signed)
Pt is in the [facility] for pneumonia again and he has recently been dx with Small cell Cancer per his sister

## 2022-08-20 NOTE — Progress Notes (Signed)
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  Postobstructive pneumonia also given empiric ceftriaxone azithromycin  Hospital-Problem based course  Hypotension tachycardia volume depletion from diarrhea - Check C. difficile plan and GI pathogen panel - Keep on precautions for now - Continue saline 125 cc/H - As is awake alert can have diet  Possible immunosuppression with thrombocytopenia, leukopenia from chemo -Get differential and check ANC - At risk for febrile neutropenia  Recent diagnosis stage IV SCLC on numerous meds including immunotherapy - Dr. Earlie Server added to treatment team - Feel that some of the symptoms may be secondary to the chemo - Will need outpatient follow-up and discussion with Dr. Earlie Server regarding the same  AKI on admission likely secondary to prior to admission, ibuprofen lisinopril as well as from diarrhea - Expect to recover but check labs in a.m., continue IV fluids as above - If not we will do urine studies and place Foley  Hypotension with a history of hypertension - Holding lisinopril 10 and amlodipine 5 at this time  Hypothyroidism - Continue Synthroid 150 mcg daily  Elevated uric acid with risk of tumor lysis - Given rasburicase x 1 - Repeat uric acid in the morning  -Replacing calcium as hypocalcemic as well   DVT prophylaxis: SCD Code Status: Full Family Communication:  Niece at bedside Disposition:  Status is: Inpatient Remains inpatient appropriate because:   Remains ill still    Subjective: Patient interviewed with sign language interpreter  Awake coherent no distress Feels fair had 4 episodes of diarrhea No nausea No abdominal pain  Objective: Vitals:   08/19/22 2115 08/19/22 2145 08/19/22 2252 08/20/22 0403  BP: 109/62 (!) 87/55 115/89 117/62  Pulse: 92 96 100 99  Resp: (!) 21 (!) 27 20 (!) 22  Temp:   98.5 F (36.9 C) 98.4 F (36.9 C)  TempSrc:   Oral Oral  SpO2: 99% 94% 97% 95%  Weight:      Height:        Intake/Output Summary (Last 24 hours) at 08/20/2022 0847 Last data filed at 08/19/2022 1852 Gross per 24 hour  Intake 350.77 ml  Output --  Net 350.77 ml   Filed Weights   08/19/22 1505  Weight: 77.1 kg    Examination:  EOMI NCAT no focal deficit thick.  Does have some strabismus Neck soft supple Chest clear S1-S2 no murmur-telemetry shows normal sinus rhythm/sinus tach Abdomen soft no rebound no guarding ROM intact no focal deficit  Data Reviewed: personally reviewed   CBC    Component Value Date/Time   WBC 2.1 (L) 08/20/2022 0012   RBC 3.31 (L) 08/20/2022 0012   HGB 9.8 (L) 08/20/2022 0012   HGB 14.1 08/10/2022 0911   HGB 14.6 04/10/2022 1014   HCT 28.5 (L) 08/20/2022 0012   HCT 44.0 04/10/2022 1014   PLT 66 (L) 08/20/2022 0012   PLT 296 08/10/2022 0911   PLT 354 04/10/2022 1014   MCV 86.1 08/20/2022 0012   MCV 91 04/10/2022 1014  MCH 29.6 08/20/2022 0012   MCHC 34.4 08/20/2022 0012   RDW 12.9 08/20/2022 0012   RDW 12.0 04/10/2022 1014   LYMPHSABS 0.2 (L) 08/19/2022 1001   LYMPHSABS 2.9 09/02/2021 0900   MONOABS 0.6 08/19/2022 1001   EOSABS 0.0 08/19/2022 1001   EOSABS 0.6 (H) 09/02/2021 0900   BASOSABS 0.0 08/19/2022 1001   BASOSABS 0.1 09/02/2021 0900      Latest Ref Rng & Units 08/20/2022    3:10 AM 08/19/2022    5:11 PM 08/19/2022   10:01 AM  CMP  Glucose 70 - 99 mg/dL 183  140   164   BUN 6 - 20 mg/dL 83  111  97   Creatinine 0.61 - 1.24 mg/dL 4.67  7.20  5.66   Sodium 135 - 145 mmol/L 132  127  126   Potassium 3.5 - 5.1 mmol/L 4.3  5.4  5.0   Chloride 98 - 111 mmol/L 103  100  94   CO2 22 - 32 mmol/L 17   16   Calcium 8.9 - 10.3 mg/dL 6.3   7.1   Total Protein 6.5 - 8.1 g/dL   7.4   Total Bilirubin 0.3 - 1.2 mg/dL   0.9   Alkaline Phos 38 - 126 U/L   65   AST 15 - 41 U/L   17   ALT 0 - 44 U/L   18      Radiology Studies: US RENAL  Result Date: 08/19/2022 CLINICAL DATA:  AKI EXAM: RENAL / URINARY TRACT ULTRASOUND COMPLETE COMPARISON:  PET/CT 07/27/2022 FINDINGS: Right Kidney: Renal measurements: 9.6 x 4.6 x 4.3 cm = volume: 99 mL. Echogenicity within normal limits. No mass or hydronephrosis visualized. Left Kidney: Renal measurements: 11.2 x 5.3 x 4.6 cm = volume: 144 mL. Echogenicity within normal limits. No mass or hydronephrosis visualized. Bladder: Appears normal for degree of bladder distention. Other: None. IMPRESSION: Unremarkable renal ultrasound. Electronically Signed   By: Placido Sou M.D.   On: 08/19/2022 21:17   DG Chest Port 1 View  Result Date: 08/19/2022 CLINICAL DATA:  Cough, tachypnea, history of lung cancer EXAM: PORTABLE CHEST 1 VIEW COMPARISON:  07/24/2022, 07/27/2022 FINDINGS: Single frontal view of the chest demonstrates right chest wall port via internal jugular approach tip overlying the atriocaval junction. Complete consolidation of the left lower lobe consistent with known central obstructing mass and postobstructive change. There is some increasing ground-glass consolidation within the left lateral lung base, which could reflect superimposed left upper lobe pneumonia. No acute bony abnormality. IMPRESSION: 1. Stable left lower lobe consolidation consistent with known central obstructing malignancy and postobstructive consolidation. 2. New ground-glass consolidation at the left lateral lung base, which could reflect aspiration or  infection within the left upper lobe. Electronically Signed   By: Randa Ngo M.D.   On: 08/19/2022 16:53     Scheduled Meds:  Chlorhexidine Gluconate Cloth  6 each Topical Daily   levothyroxine  150 mcg Oral QAC breakfast   sodium chloride flush  3 mL Intravenous Q12H   Continuous Infusions:  azithromycin (ZITHROMAX) 500 mg in sodium chloride 0.9 % 250 mL IVPB     cefTRIAXone (ROCEPHIN)  IV       LOS: 1 day   Time spent: [place], MD [facility] To contact the attending provider between 7A-7P or the covering provider during after hours 7P-7A, please log into the web site [email or web address] and access using universal [place] password for that web site. If  you do not have the password, please call the hospital operator.  08/20/2022, 8:47 AM

## 2022-08-20 NOTE — TOC Initial Note (Signed)
Transition of Care [facility]) - Initial/Assessment Note    Patient Details  Name: Brett Campbell MRN: [id number] Date of Birth: [birth year]  [facility] CM/SW Contact:    Leeroy Cha, RN Phone Number: 08/20/2022, 8:08 AM  Clinical Narrative:                 Smoking cessation information added to the dc instructions.  Expected Discharge Plan: Home/Self Care Barriers to Discharge: Continued Medical Work up   Patient Goals and CMS Choice Patient states their goals for this hospitalization and ongoing recovery are:: unable to state clearly -pt is deaf but does read lips.  wants to go home          Expected Discharge Plan and Services   Discharge Planning Services: CM Consult   Living arrangements for the past 2 months: [place]                                      Prior Living Arrangements/Services Living arrangements for the past 2 months: Single Family Home Lives with:: Self Patient language and need for interpreter reviewed:: Yes (needs sign language and reads lips) Do you feel safe going back to the place where you live?: Yes            Criminal Activity/Legal Involvement Pertinent to Current Situation/Hospitalization: No - Comment as needed  Activities of Daily Living Home Assistive Devices/Equipment: Wheelchair, Environmental consultant (specify type) ADL Screening (condition at time of admission) Patient's cognitive ability adequate to safely complete daily activities?: No Is the patient deaf or have difficulty hearing?: Yes Does the patient have difficulty seeing, even when wearing glasses/contacts?: Yes Does the patient have difficulty concentrating, remembering, or making decisions?: Yes Patient able to express need for assistance with ADLs?: Yes Does the patient have difficulty dressing or bathing?: Yes Independently performs ADLs?: Yes (appropriate for developmental age) Does the patient have difficulty walking or climbing stairs?:  No Weakness of Legs: Both Weakness of Arms/Hands: None  Permission Sought/Granted                  Emotional Assessment Appearance:: Appears stated age Attitude/Demeanor/Rapport: Engaged Affect (typically observed): Calm, Guarded Orientation: : Oriented to Self, Oriented to Place, Oriented to  Time, Oriented to Situation Alcohol / Substance Use: Tobacco Use, Alcohol Use, Illicit Drugs (current smoker, current occasional etoh intake and marijuana) Psych Involvement: No (comment)  Admission diagnosis:  Acute renal failure ([place]) [N17.9] Septic shock ([place]) [A41.9, R65.21] Pneumonia of left lower lobe due to infectious organism [J18.9] Patient Active Problem List   Diagnosis Date Noted   Hypotension 08/19/2022   Acute renal failure ([place]) 08/19/2022   Hyponatremia 08/19/2022   Hyperuricemia 08/19/2022   Hyperphosphatemia 08/19/2022   Thrombocytopenia ([place]) 08/19/2022   Small cell lung cancer ([place]) 07/31/2022   Sensorineural hearing loss (SNHL) of both ears 04/07/2022   Pneumonia of left lower lobe due to infectious organism 03/28/2022   Hypokalemia 09/28/2021   Hypocalcemia 09/28/2021   Need for shingles vaccine 09/28/2021   Tobacco use disorder, continuous 09/28/2021   Essential hypertension 08/03/2021   Mixed hyperlipidemia 08/03/2021   Calcium deficiency 08/03/2021   Body mass index 26.0-26.9, adult 08/03/2021   S/P right hemicolectomy 02/26/2021   Goals of care, counseling/discussion 02/14/2021   Other hypoparathyroidism 11/22/2020   Acquired hypothyroidism 11/22/2020   Personal history of colonic polyps 11/03/2020   PCP:  Junius Creamer,  Greer Ee, NP Pharmacy:   [facility] - [place], [place] [address] Phone: [phone] Fax: [phone]  [facility] [address] [place]), [place] - [address] [place] ([place]) [place] [address] Phone: [phone] Fax:  [phone]     Social Determinants of Health (SDOH) Social History: [place]: No Food Insecurity (08/19/2022)  Housing: Low Risk  (08/19/2022)  Transportation Needs: Unmet Transportation Needs (08/19/2022)  Utilities: Not At Risk (08/19/2022)  Depression (PHQ2-9): Low Risk  (04/07/2022)  Financial Resource Strain: High Risk (07/31/2022)  Tobacco Use: High Risk (08/14/2022)   SDOH Interventions:     Readmission Risk Interventions   No data to display

## 2022-08-20 NOTE — Telephone Encounter (Signed)
Thank you for letting me know.  I did read about the cancer diagnosis. I had referred him based on CT scan results.

## 2022-08-21 ENCOUNTER — Inpatient Hospital Stay: Payer: Medicare HMO

## 2022-08-21 DIAGNOSIS — C3490 Malignant neoplasm of unspecified part of unspecified bronchus or lung: Secondary | ICD-10-CM

## 2022-08-21 DIAGNOSIS — N179 Acute kidney failure, unspecified: Secondary | ICD-10-CM | POA: Diagnosis not present

## 2022-08-21 LAB — GASTROINTESTINAL PANEL BY PCR, STOOL (REPLACES STOOL CULTURE)

## 2022-08-21 LAB — COMPREHENSIVE METABOLIC PANEL
ALT: 23 U/L (ref 0–44)
AST: 25 U/L (ref 15–41)
Albumin: 2 g/dL — ABNORMAL LOW (ref 3.5–5.0)
Alkaline Phosphatase: 55 U/L (ref 38–126)
Anion gap: 8 (ref 5–15)
BUN: 63 mg/dL — ABNORMAL HIGH (ref 6–20)
CO2: 18 mmol/L — ABNORMAL LOW (ref 22–32)
Calcium: 6 mg/dL — CL (ref 8.9–10.3)
Chloride: 109 mmol/L (ref 98–111)
Creatinine, Ser: 2.72 mg/dL — ABNORMAL HIGH (ref 0.61–1.24)
GFR, Estimated: 26 mL/min — ABNORMAL LOW (ref >60)
Glucose, Bld: 91 mg/dL (ref 70–99)
Potassium: 4 mmol/L (ref 3.5–5.1)
Sodium: 135 mmol/L (ref 135–145)
Total Bilirubin: 0.3 mg/dL (ref 0.3–1.2)
Total Protein: 6.1 g/dL — ABNORMAL LOW (ref 6.5–8.1)

## 2022-08-21 LAB — CBC
HCT: 27.5 % — ABNORMAL LOW (ref 39.0–52.0)
Hemoglobin: 9.4 g/dL — ABNORMAL LOW (ref 13.0–17.0)
MCH: 29.8 pg (ref 26.0–34.0)
MCHC: 34.2 g/dL (ref 30.0–36.0)
MCV: 87.3 fL (ref 80.0–100.0)
Platelets: 84 10*3/uL — ABNORMAL LOW (ref 150–400)
RBC: 3.15 MIL/uL — ABNORMAL LOW (ref 4.22–5.81)
RDW: 13.4 % (ref 11.5–15.5)
WBC: 2.8 10*3/uL — ABNORMAL LOW (ref 4.0–10.5)
nRBC: 0 % (ref 0.0–0.2)

## 2022-08-21 LAB — GLUCOSE, CAPILLARY: Glucose-Capillary: 97 mg/dL (ref 70–99)

## 2022-08-21 LAB — URIC ACID: Uric Acid, Serum: <1.5 mg/dL — ABNORMAL LOW (ref 3.7–8.6)

## 2022-08-21 MED ORDER — ALUM & MAG HYDROXIDE-SIMETH 200-200-20 MG/5ML PO SUSP
15.0000 mL | Freq: Four times a day (QID) | ORAL | Status: DC | PRN
  Administered 2022-08-21: 15 mL via ORAL
  Filled 2022-08-21: qty 30

## 2022-08-21 MED ORDER — LEVOFLOXACIN 500 MG PO TABS
500.0000 mg | ORAL_TABLET | ORAL | Status: DC
  Administered 2022-08-21: 500 mg via ORAL
  Filled 2022-08-21: qty 1

## 2022-08-21 MED ORDER — CALCIUM GLUCONATE-NACL 2-0.675 GM/100ML-% IV SOLN
2.0000 g | Freq: Once | INTRAVENOUS | Status: AC
  Administered 2022-08-21: 2000 mg via INTRAVENOUS
  Filled 2022-08-21: qty 100

## 2022-08-21 NOTE — Plan of Care (Signed)
  Problem: Activity: Goal: Ability to tolerate increased activity will improve Outcome: Progressing   Problem: Clinical Measurements: Goal: Ability to maintain a body temperature in the normal range will improve Outcome: Progressing   Problem: Respiratory: Goal: Ability to maintain adequate ventilation will improve Outcome: Progressing Goal: Ability to maintain a clear airway will improve Outcome: Progressing   Problem: Education: Goal: Knowledge of General Education information will improve Description: Including pain rating scale, medication(s)/side effects and non-pharmacologic comfort measures Outcome: Progressing   Problem: Health Behavior/Discharge Planning: Goal: Ability to manage health-related needs will improve Outcome: Progressing   Problem: Clinical Measurements: Goal: Ability to maintain clinical measurements within normal limits will improve Outcome: Progressing Goal: Will remain free from infection Outcome: Progressing Goal: Diagnostic test results will improve Outcome: Progressing Goal: Respiratory complications will improve Outcome: Progressing Goal: Cardiovascular complication will be avoided Outcome: Progressing   Problem: Activity: Goal: Risk for activity intolerance will decrease Outcome: Progressing   Problem: Nutrition: Goal: Adequate nutrition will be maintained Outcome: Progressing   Problem: Coping: Goal: Level of anxiety will decrease Outcome: Progressing   Problem: Pain Managment: Goal: General experience of comfort will improve Outcome: Progressing   Problem: Safety: Goal: Ability to remain free from injury will improve Outcome: Progressing   Problem: Skin Integrity: Goal: Risk for impaired skin integrity will decrease Outcome: Progressing   Patient is resting in bed at time of entry, communicates via interpreter, needs met. Call bell and personal belongings are within the patients reach.

## 2022-08-21 NOTE — Progress Notes (Signed)
DIAGNOSIS: Extensive stage (T4, N3, M1 C) small cell lung cancer presented with obstructing left lower lobe lung mass with left extrapleural lymph node, bilateral adrenal metastasis, pancreatic lesion as well as upper abdominal lymphadenopathy and soft tissue nodules in the abdominal wall as well as the abdomen diagnosed in December 2023.   PRIOR THERAPY: None   CURRENT THERAPY: Systemic chemotherapy with carboplatin for AUC of 5 on day 1, etoposide 100 Mg/M2 on days 1, 2 and 3 with Cosela 240 Mg/M2 on the days of the chemotherapy and Imfinzi 1500 Mg IV every 3 weeks.  First dose August 10, 2022  Subjective: The patient is seen and examined today.  He is feeling much better today with no concerning complaints.  His nausea and diarrhea has significantly improved.  He was admitted with acute renal failure after several days of lack of appetite and poor p.o. intake and dehydration.  His serum creatinine on the day of admission was 5.66.  He received aggressive hydration.  His C. difficile was negative.  Repeat renal function yesterday showed some improvement of his serum creatinine down to 4.67 and today 2.72.  The patient tolerated the first cycle of his treatment well except for the fatigue as well as the lack of appetite and dehydration.  He is expected to be discharged home soon.  Objective: Vital signs in last 24 hours: Temp:  [97.6 F (36.4 C)-98.2 F (36.8 C)] 97.6 F (36.4 C) (12/29 0435) Pulse Rate:  [91-96] 96 (12/28 2146) Resp:  [20-24] 20 (12/28 2146) BP: (100-118)/(49-63) 103/50 (12/29 0435) SpO2:  [94 %-95 %] 95 % (12/29 0435) Weight:  [175 lb 0.7 oz (79.4 kg)] 175 lb 0.7 oz (79.4 kg) (12/29 0557)  Intake/Output from previous day: 12/28 0701 - 12/29 0700 In: 832.9 [I.V.:832.9] Out: 2200 [Urine:2200] Intake/Output this shift: Total I/O In: -  Out: 300 [Urine:300]  General appearance: alert, cooperative, fatigued, and no distress Resp: clear to auscultation  bilaterally Cardio: regular rate and rhythm, S1, S2 normal, no murmur, click, rub or gallop GI: soft, non-tender; bowel sounds normal; no masses,  no organomegaly Extremities: extremities normal, atraumatic, no cyanosis or edema  Lab Results:  Recent Labs    08/20/22 0012 08/21/22 0500  WBC 2.1* 2.8*  HGB 9.8* 9.4*  HCT 28.5* 27.5*  PLT 66* 84*   BMET Recent Labs    08/19/22 1001 08/19/22 1711 08/20/22 0310  NA 126* 127* 132*  K 5.0 5.4* 4.3  CL 94* 100 103  CO2 16*  --  17*  GLUCOSE 164* 140* 183*  BUN 97* 111* 83*  CREATININE 5.66* 7.20* 4.67*  CALCIUM 7.1*  --  6.3*    Studies/Results: US RENAL  Result Date: 08/19/2022 CLINICAL DATA:  AKI EXAM: RENAL / URINARY TRACT ULTRASOUND COMPLETE COMPARISON:  PET/CT 07/27/2022 FINDINGS: Right Kidney: Renal measurements: 9.6 x 4.6 x 4.3 cm = volume: 99 mL. Echogenicity within normal limits. No mass or hydronephrosis visualized. Left Kidney: Renal measurements: 11.2 x 5.3 x 4.6 cm = volume: 144 mL. Echogenicity within normal limits. No mass or hydronephrosis visualized. Bladder: Appears normal for degree of bladder distention. Other: None. IMPRESSION: Unremarkable renal ultrasound. Electronically Signed   By: Placido Sou M.D.   On: 08/19/2022 21:17   DG Chest Port 1 View  Result Date: 08/19/2022 CLINICAL DATA:  Cough, tachypnea, history of lung cancer EXAM: PORTABLE CHEST 1 VIEW COMPARISON:  07/24/2022, 07/27/2022 FINDINGS: Single frontal view of the chest demonstrates right chest wall port via internal jugular approach  tip overlying the atriocaval junction. Complete consolidation of the left lower lobe consistent with known central obstructing mass and postobstructive change. There is some increasing ground-glass consolidation within the left lateral lung base, which could reflect superimposed left upper lobe pneumonia. No acute bony abnormality. IMPRESSION: 1. Stable left lower lobe consolidation consistent with known central  obstructing malignancy and postobstructive consolidation. 2. New ground-glass consolidation at the left lateral lung base, which could reflect aspiration or infection within the left upper lobe. Electronically Signed   By: Randa Ngo M.D.   On: 08/19/2022 16:53    Medications: I have reviewed the patient's current medications.   Assessment/Plan: This is a very pleasant 61 years old white male diagnosed with extensive stage small cell lung cancer in December 2023 and started the first cycle of his treatment with carboplatin, etoposide and Imfinzi on August 10, 2022.  He has tolerated the first cycle well except for the fatigue, lack of appetite and dehydration.  He was seen in the office few days ago and his serum creatinine was significantly elevated consistent with acute renal failure secondary to dehydration. The patient was admitted to the hospital and with aggressive hydration and treatment for nausea and diarrhea he is feeling much better. For the acute renal failure, will continue with the IV hydration and close monitoring for now. I will arrange for the patient a follow-up appointment with me after discharge for reevaluation and close monitoring of his condition.  I may adjust his future chemotherapy doses to avoid a similar episode in the future. Thank you for taking good care of Mr. Asche.  Please call if you have any questions.   LOS: 2 days    Eilleen Kempf 08/21/2022

## 2022-08-21 NOTE — Progress Notes (Signed)
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  Postobstructive pneumonia also given empiric ceftriaxone azithromycin  Hospital-Problem based course  Hypotension tachycardia volume depletion from diarrhea - C. difficile is negative -DC IV saline in a.m. allow diet  Possible postobstructive pneumonia Possible immunosuppression with thrombocytopenia, leukopenia from chemo -Get differential and check ANC - At risk for febrile neutropenia -Not convinced that this is pneumonia or may be post obstruction-transition to oral Levaquin and complete as outpatient  Recent diagnosis stage IV SCLC on numerous meds including immunotherapy - Dr. Earlie Server added to treatment team - symptoms may be secondary to the chemo - Will need outpatient follow-up and discussion with Dr. Earlie Server regarding the same  AKI on admission likely secondary to prior to admission, ibuprofen lisinopril as well as from diarrhea - Continue fluids overnight - If not we will do urine studies and place Foley  Severe hypocalcemia - Repeat calcium gluconate give 2 g monitor   Hypotension with a history of hypertension - Holding lisinopril 10 and amlodipine 5 at this time  Hypothyroidism - Continue Synthroid 150 mcg daily  Elevated uric acid with risk of tumor lysis - Given rasburicase x 1 - Uric acid is now less than 1.5 -Replacing calcium as hypocalcemic  as well   DVT prophylaxis: SCD Code Status: Full Family Communication: Discussed with sister Disposition:  Status is: Inpatient Remains inpatient appropriate because:   Remains ill still    Subjective: Patient interviewed with sign language interpreter  Awake coherent no distress Comfortable in no distress less diarrhea no fever no chills sitting up looks better  Objective: Vitals:   08/20/22 2146 08/21/22 0435 08/21/22 0557 08/21/22 1234  BP: (!) 100/49 (!) 103/50  119/63  Pulse: 96   88  Resp: 20   19  Temp: 98.2 F (36.8 C) 97.6 F (36.4 C)  98.4 F (36.9 C)  TempSrc: Oral Oral  Oral  SpO2: 95% 95%  93%  Weight:   79.4 kg   Height:        Intake/Output Summary (Last 24 hours) at 08/21/2022 1333 Last data filed at 08/21/2022 1234 Gross per 24 hour  Intake 2051.23 ml  Output 1500 ml  Net 551.23 ml    Filed Weights   08/19/22 1505 08/21/22 0557  Weight: 77.1 kg 79.4 kg    Examination:  EOMI NCAT no focal deficit ROM intact Neck soft supple Chest clear S1-S2 no murmur-telemetry shows normal sinus rhythm/sinus tach Abdomen soft no rebound no guarding ROM intact no focal deficit  Data Reviewed: personally reviewed   CBC    Component Value Date/Time   WBC 2.8 (L) 08/21/2022 0500   RBC 3.15 (L) 08/21/2022 0500   HGB 9.4 (L) 08/21/2022 0500   HGB 14.1 08/10/2022 0911   HGB 14.6 04/10/2022 1014   HCT 27.5 (L) 08/21/2022 0500   HCT 44.0 04/10/2022 1014   PLT 84 (L) 08/21/2022 0500   PLT 296 08/10/2022 0911   PLT 354 04/10/2022 1014   MCV 87.3 08/21/2022  0500   MCV 91 04/10/2022 1014   MCH 29.8 08/21/2022 0500   MCHC 34.2 08/21/2022 0500   RDW 13.4 08/21/2022 0500   RDW 12.0 04/10/2022 1014   LYMPHSABS 0.2 (L) 08/19/2022 1001   LYMPHSABS 2.9 09/02/2021 0900   MONOABS 0.6 08/19/2022 1001   EOSABS 0.0 08/19/2022 1001   EOSABS 0.6 (H) 09/02/2021 0900   BASOSABS 0.0 08/19/2022 1001   BASOSABS 0.1 09/02/2021 0900      Latest Ref Rng & Units  08/21/2022    5:10 AM 08/20/2022    3:10 AM 08/19/2022    5:11 PM  CMP  Glucose 70 - 99 mg/dL 91  183  140   BUN 6 - 20 mg/dL 63  83  111   Creatinine 0.61 - 1.24 mg/dL 2.72  4.67  7.20   Sodium 135 - 145 mmol/L 135  132  127   Potassium 3.5 - 5.1 mmol/L 4.0  4.3  5.4   Chloride 98 - 111 mmol/L 109  103  100   CO2 22 - 32 mmol/L 18  17    Calcium 8.9 - 10.3 mg/dL 6.0  6.3    Total Protein 6.5 - 8.1 g/dL 6.1     Total Bilirubin 0.3 - 1.2 mg/dL 0.3     Alkaline Phos 38 - 126 U/L 55     AST 15 - 41 U/L 25     ALT 0 - 44 U/L 23        Radiology Studies: US RENAL  Result Date: 08/19/2022 CLINICAL DATA:  AKI EXAM: RENAL / URINARY TRACT ULTRASOUND COMPLETE COMPARISON:  PET/CT 07/27/2022 FINDINGS: Right Kidney: Renal measurements: 9.6 x 4.6 x 4.3 cm = volume: 99 mL. Echogenicity within normal limits. No mass or hydronephrosis visualized. Left Kidney: Renal measurements: 11.2 x 5.3 x 4.6 cm = volume: 144 mL. Echogenicity within normal limits. No mass or hydronephrosis visualized. Bladder: Appears normal for degree of bladder distention. Other: None. IMPRESSION: Unremarkable renal ultrasound. Electronically Signed   By: Placido Sou M.D.   On: 08/19/2022 21:17   DG Chest Port 1 View  Result Date: 08/19/2022 CLINICAL DATA:  Cough, tachypnea, history of lung cancer EXAM: PORTABLE CHEST 1 VIEW COMPARISON:  07/24/2022, 07/27/2022 FINDINGS: Single frontal view of the chest demonstrates right chest wall port via internal jugular approach tip overlying the atriocaval junction. Complete consolidation of the left lower lobe consistent with known central obstructing mass and postobstructive change. There is some increasing ground-glass consolidation within the left lateral lung base, which could reflect superimposed left upper lobe pneumonia. No acute bony abnormality. IMPRESSION: 1. Stable left lower lobe consolidation consistent with known central obstructing malignancy and postobstructive  consolidation. 2. New ground-glass consolidation at the left lateral lung base, which could reflect aspiration or infection within the left upper lobe. Electronically Signed   By: Randa Ngo M.D.   On: 08/19/2022 16:53     Scheduled Meds:  Chlorhexidine Gluconate Cloth  6 each Topical Daily   feeding supplement  237 mL Oral BID BM   levofloxacin  500 mg Oral Q48H   levothyroxine  150 mcg Oral QAC breakfast   psyllium  1 packet Oral Daily   sodium chloride flush  3 mL Intravenous Q12H   Continuous Infusions:  sodium chloride Stopped (08/21/22 1224)     LOS: 2 days   Time spent: 24  Nita Sells, MD [facility] To contact the attending provider between 7A-7P or the covering provider during after hours 7P-7A,  please log into the web site [email or web address] and access using universal [place] password for that web site. If you do not have the password, please call the hospital operator.  08/21/2022, 1:33 PM

## 2022-08-22 ENCOUNTER — Encounter (HOSPITAL_COMMUNITY): Payer: Self-pay | Admitting: Internal Medicine

## 2022-08-22 DIAGNOSIS — N179 Acute kidney failure, unspecified: Secondary | ICD-10-CM | POA: Diagnosis not present

## 2022-08-22 LAB — CBC WITH DIFFERENTIAL/PLATELET
Abs Immature Granulocytes: 0.03 10*3/uL (ref 0.00–0.07)
Basophils Absolute: 0 10*3/uL (ref 0.0–0.1)
Basophils Relative: 1 %
Eosinophils Absolute: 0.1 10*3/uL (ref 0.0–0.5)
Eosinophils Relative: 1 %
HCT: 28.4 % — ABNORMAL LOW (ref 39.0–52.0)
Hemoglobin: 9.5 g/dL — ABNORMAL LOW (ref 13.0–17.0)
Immature Granulocytes: 1 %
Lymphocytes Relative: 21 %
Lymphs Abs: 0.8 10*3/uL (ref 0.7–4.0)
MCH: 29.2 pg (ref 26.0–34.0)
MCHC: 33.5 g/dL (ref 30.0–36.0)
MCV: 87.4 fL (ref 80.0–100.0)
Monocytes Absolute: 0.5 10*3/uL (ref 0.1–1.0)
Monocytes Relative: 15 %
Neutro Abs: 2.2 10*3/uL (ref 1.7–7.7)
Neutrophils Relative %: 61 %
Platelets: 110 10*3/uL — ABNORMAL LOW (ref 150–400)
RBC: 3.25 MIL/uL — ABNORMAL LOW (ref 4.22–5.81)
RDW: 13.6 % (ref 11.5–15.5)
WBC: 3.6 10*3/uL — ABNORMAL LOW (ref 4.0–10.5)
nRBC: 0 % (ref 0.0–0.2)

## 2022-08-22 LAB — BASIC METABOLIC PANEL
Anion gap: 8 (ref 5–15)
BUN: 40 mg/dL — ABNORMAL HIGH (ref 6–20)
CO2: 18 mmol/L — ABNORMAL LOW (ref 22–32)
Calcium: 5.9 mg/dL — CL (ref 8.9–10.3)
Chloride: 112 mmol/L — ABNORMAL HIGH (ref 98–111)
Creatinine, Ser: 1.72 mg/dL — ABNORMAL HIGH (ref 0.61–1.24)
GFR, Estimated: 45 mL/min — ABNORMAL LOW (ref >60)
Glucose, Bld: 98 mg/dL (ref 70–99)
Potassium: 3.8 mmol/L (ref 3.5–5.1)
Sodium: 138 mmol/L (ref 135–145)

## 2022-08-22 MED ORDER — LOPERAMIDE HCL 2 MG PO TABS: 2.0000 mg | ORAL_TABLET | Freq: Four times a day (QID) | ORAL | 0 refills | Status: DC | PRN

## 2022-08-22 MED ORDER — HEPARIN SOD (PORK) LOCK FLUSH 100 UNIT/ML IV SOLN
500.0000 [IU] | INTRAVENOUS | Status: AC | PRN
  Administered 2022-08-22: 500 [IU]

## 2022-08-22 MED ORDER — CALCIUM CARBONATE ANTACID 500 MG PO CHEW: 1.0000 | CHEWABLE_TABLET | Freq: Three times a day (TID) | ORAL | 3 refills | Status: AC

## 2022-08-22 MED ORDER — LEVOFLOXACIN 500 MG PO TABS: 500.0000 mg | ORAL_TABLET | ORAL | 0 refills | Status: DC

## 2022-08-22 MED ORDER — SODIUM CHLORIDE 0.9 % IV SOLN
4.0000 g | Freq: Once | INTRAVENOUS | Status: AC
  Administered 2022-08-22: 4 g via INTRAVENOUS
  Filled 2022-08-22: qty 40

## 2022-08-22 NOTE — Plan of Care (Signed)

## 2022-08-22 NOTE — Progress Notes (Signed)
OT Cancellation Note  Patient Details Name: Million Maharaj MRN: [id number] DOB: 05/28/1961   Cancelled Treatment:    Reason Eval/Treat Not Completed: OT screened, no needs identified, will sign off. Patient doing well per nursing reports - able to toilet himself, feed himself, groom himself. Is generally weak. Patient also did well with PT. Patient being discharged today. Patient has family assist at discharge if needed.  Joseph Johns L Daire Okimoto 08/22/2022, 11:12 AM

## 2022-08-22 NOTE — Discharge Summary (Signed)
Physician Discharge Summary  Brett Campbell IWO:[id number] DOB: [birth year] DOA: 08/19/2022  PCP: [name], NP  Admit date: 08/19/2022 Discharge date: 08/22/2022  Time spent: 27 minutes  Recommendations for Outpatient Follow-up:  Recommend outpatient follow-up of Chem-12 and calcium levels in about 1 week Recommend amlodipine for 2 days scheduled and then as needed for diarrhea Complete Levaquin for presumed pneumonia although this was less likely  Discharge Diagnoses:   Hypotension secondary to volume depletion from chemotherapy Possible postobstructive pneumonia with stage IV SCLC AKI on admission Hypocalcemia anemia Hypothyroid ?  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  Postobstructive pneumonia also given empiric ceftriaxone azithromycin  Hospital Course:  Hypotension tachycardia volume depletion from diarrhea - C. difficile is negative -DC IV saline in a.m. allow diet -Volume depletion resolved on discharge   Possible postobstructive pneumonia Possible immunosuppression with thrombocytopenia, leukopenia from chemo -Get differential and check ANC - At risk  for febrile neutropenia -Not convinced that this is pneumonia or may be post obstruction-transition to oral Levaquin and complete as outpatient   Recent diagnosis stage IV SCLC on numerous meds including immunotherapy - Dr. Earlie Server added to treatment team - symptoms may be secondary to the chemo - Will need outpatient follow-up and discussion with Dr. Earlie Server regarding the same   AKI on admission likely secondary to prior to admission, ibuprofen lisinopril as well as from diarrhea - Fluids were repleted and he looked better   Resolving Hypocalcemia -Giving calcium once more IV -Replace p.o. as well   Hypotension with a history of hypertension - Holding lisinopril 10 at time of discharge given Foley depletion - Only use amlodipine 5 at this time   Hypothyroidism - Continue Synthroid 150 mcg daily   Elevated uric acid with risk of tumor lysis - Given rasburicase x 1 - Uric acid is now less than 1.5 -Replacing calcium as hypocalcemic as well   Discharge Exam: Vitals:   08/21/22 2030 08/22/22 0451  BP: 133/69 (!) 141/77  Pulse: 99 91  Resp: (!) 22 20  Temp: 98.9 F (37.2 C) 97.7 F (36.5 C)  SpO2: 94% 95%    Subj on day of d/c   Awake coherent no distress eating drinking still having some stool but somewhat decreased  General Exam on discharge  EOMI NCAT no focal deficit no icterus no pallor no rales no rhonchi no wheeze CTA B no added sound ROM intact Neuro intact  Discharge Instructions   Discharge Instructions     Diet - low sodium heart healthy   Complete by: As directed    Discharge instructions   Complete by: As directed    Complete antibiotic course for the question that we had for pneumonia although I do not think this  was the cause for your admission and it was seen only on x-ray We have called in Imodium which she should take 4 times a day for the next 2 days and this will help slow down your diarrhea Your calcium was slightly low and we gave you  IV replacement and you will continue calcium carbonate at home Do not take your lisinopril only take amlodipine-your dehydration was probably in part caused by lisinopril and this can be resumed once you follow-up with your primary care physician in the outpatient setting   Increase activity slowly   Complete by: As directed       Allergies as of 08/22/2022       Reactions   Penicillins Other (See Comments)   Did it involve swelling of the face/tongue/throat, SOB, or low BP? Unknown Did it involve sudden or severe rash/hives, skin peeling, or any reaction on the inside of your mouth or nose? Unknown Did you need to seek medical attention at a hospital or doctor's office? Unknown When did it last happen?     childhood  If all above answers are "NO", may proceed with cephalosporin use.        Medication List     STOP taking these medications    CALCIUM 600 + MINERALS PO   Caltrate 600+D Plus Minerals 600-800 MG-UNIT Chew   lisinopril 10 MG tablet Commonly known as: ZESTRIL       TAKE these medications    amLODipine 5 MG tablet Commonly known as: NORVASC Take 1 tablet (5 mg total) by mouth daily.   atorvastatin 10 MG tablet Commonly known as: LIPITOR TAKE 1 TABLET EVERY DAY   calcitRIOL 0.25 MCG capsule Commonly known as: ROCALTROL Take 1 capsule (0.25 mcg total) by mouth daily.   calcium carbonate 500 MG chewable tablet Commonly known as: Tums Chew 1 tablet (200 mg of elemental calcium total) by mouth 3 (three) times daily.   colestipol 1 g tablet Commonly known as: COLESTID Take 1 tablet (1 g total) by mouth 2 (two) times daily. What changed: when to take this   ibuprofen 200 MG tablet Commonly known as: ADVIL Take 400-800 mg by mouth every 6 (six) hours as needed for moderate pain.   levofloxacin 500 MG tablet Commonly known as: LEVAQUIN Take 1 tablet (500 mg total) by mouth every other day. Start taking on: August 23, 2022   levothyroxine 150 MCG  tablet Commonly known as: SYNTHROID Take 1 tablet (150 mcg total) by mouth daily. What changed: when to take this   lidocaine-prilocaine cream Commonly known as: EMLA Apply to affected area once   loperamide 2 MG tablet Commonly known as: Imodium A-D Take 1 tablet (2 mg total) by mouth 4 (four) times daily as needed for diarrhea or loose stools.   Mucus Relief Chest Congestion 400 MG Tabs tablet Generic drug: guaifenesin Take 400 mg by mouth every 4 (four) hours as needed (cough).   ondansetron 8 MG tablet Commonly known as: Zofran Take 1 tablet (8 mg total) by mouth every 8 (eight) hours as needed for nausea, vomiting or refractory nausea / vomiting. Start on the third day after carboplatin. What changed: when to take this   potassium chloride SA 20 MEQ tablet Commonly known as: KLOR-CON M TAKE 1 TABLET EVERY DAY   prochlorperazine 10 MG tablet Commonly known as: COMPAZINE Take 1 tablet (10 mg total) by mouth every 6 (six) hours as needed. What changed: reasons to take this  Allergies  Allergen Reactions   Penicillins Other (See Comments)    Did it involve swelling of the face/tongue/throat, SOB, or low BP? Unknown Did it involve sudden or severe rash/hives, skin peeling, or any reaction on the inside of your mouth or nose? Unknown Did you need to seek medical attention at a hospital or doctor's office? Unknown When did it last happen?     childhood  If all above answers are "NO", may proceed with cephalosporin use.        The results of significant diagnostics from this hospitalization (including imaging, microbiology, ancillary and laboratory) are listed below for reference.    Significant Diagnostic Studies: US RENAL  Result Date: 08/19/2022 CLINICAL DATA:  AKI EXAM: RENAL / URINARY TRACT ULTRASOUND COMPLETE COMPARISON:  PET/CT 07/27/2022 FINDINGS: Right Kidney: Renal measurements: 9.6 x 4.6 x 4.3 cm = volume: 99 mL. Echogenicity within normal limits. No  mass or hydronephrosis visualized. Left Kidney: Renal measurements: 11.2 x 5.3 x 4.6 cm = volume: 144 mL. Echogenicity within normal limits. No mass or hydronephrosis visualized. Bladder: Appears normal for degree of bladder distention. Other: None. IMPRESSION: Unremarkable renal ultrasound. Electronically Signed   By: Placido Sou M.D.   On: 08/19/2022 21:17   DG Chest Port 1 View  Result Date: 08/19/2022 CLINICAL DATA:  Cough, tachypnea, history of lung cancer EXAM: PORTABLE CHEST 1 VIEW COMPARISON:  07/24/2022, 07/27/2022 FINDINGS: Single frontal view of the chest demonstrates right chest wall port via internal jugular approach tip overlying the atriocaval junction. Complete consolidation of the left lower lobe consistent with known central obstructing mass and postobstructive change. There is some increasing ground-glass consolidation within the left lateral lung base, which could reflect superimposed left upper lobe pneumonia. No acute bony abnormality. IMPRESSION: 1. Stable left lower lobe consolidation consistent with known central obstructing malignancy and postobstructive consolidation. 2. New ground-glass consolidation at the left lateral lung base, which could reflect aspiration or infection within the left upper lobe. Electronically Signed   By: Randa Ngo M.D.   On: 08/19/2022 16:53   IR IMAGING GUIDED PORT INSERTION  Result Date: 08/14/2022 INDICATION: History of lung cancer. In need of durable intravenous access for chemotherapy administration. EXAM: IMPLANTED PORT A CATH PLACEMENT WITH ULTRASOUND AND FLUOROSCOPIC GUIDANCE COMPARISON:  PET-CT-07/27/2022 MEDICATIONS: None ANESTHESIA/SEDATION: Moderate (conscious) sedation was employed during this procedure as administered by the Interventional Radiology RN. A total of Versed 1 mg and Fentanyl 50 mcg was administered intravenously. Moderate Sedation Time: 25 minutes. The patient's level of consciousness and vital signs were monitored  continuously by radiology nursing throughout the procedure under my direct supervision. CONTRAST:  None FLUOROSCOPY TIME:  18 seconds (1 mGy) COMPLICATIONS: None immediate. PROCEDURE: The procedure, risks, benefits, and alternatives were explained to the patient. Questions regarding the procedure were encouraged and answered. The patient understands and consents to the procedure. The right neck and chest were prepped with chlorhexidine in a sterile fashion, and a sterile drape was applied covering the operative field. Maximum barrier sterile technique with sterile gowns and gloves were used for the procedure. A timeout was performed prior to the initiation of the procedure. Local anesthesia was provided with 1% lidocaine with epinephrine. After creating a small venotomy incision, a micropuncture kit was utilized to access the internal jugular vein. Real-time ultrasound guidance was utilized for vascular access including the acquisition of a permanent ultrasound image documenting patency of the accessed vessel. The microwire was utilized to measure appropriate catheter length. A subcutaneous port pocket was  then created along the upper chest wall utilizing a combination of sharp and blunt dissection. The pocket was irrigated with sterile saline. A single lumen Slim sized power injectable port was chosen for placement. The 8 Fr catheter was tunneled from the port pocket site to the venotomy incision. The port was placed in the pocket. The external catheter was trimmed to appropriate length. At the venotomy, an 8 Fr peel-away sheath was placed over a guidewire under fluoroscopic guidance. The catheter was then placed through the sheath and the sheath was removed. Final catheter positioning was confirmed and documented with a fluoroscopic spot radiograph. The port was accessed with a Huber needle, aspirated and flushed with heparinized saline. The venotomy site was closed with an interrupted 4-0 Vicryl suture. The port  pocket incision was closed with interrupted 2-0 Vicryl suture. Dermabond and Steri-strips were applied to both incisions. Dressings were applied. The patient tolerated the procedure well without immediate post procedural complication. FINDINGS: After catheter placement, the tip lies within the superior cavoatrial junction. The catheter aspirates and flushes normally and is ready for immediate use. IMPRESSION: Successful placement of a right internal jugular approach power injectable Port-A-Cath. The catheter is ready for immediate use. Electronically Signed   By: Sandi Mariscal M.D.   On: 08/14/2022 15:59   MR BRAIN W WO CONTRAST  Result Date: 08/02/2022 CLINICAL DATA:  Small cell lung cancer. EXAM: MRI HEAD WITHOUT AND WITH CONTRAST TECHNIQUE: Multiplanar, multiecho pulse sequences of the brain and surrounding structures were obtained without and with intravenous contrast. CONTRAST:  57m GADAVIST GADOBUTROL 1 MMOL/ML IV SOLN COMPARISON:  None Available. FINDINGS: Brain: Numerous enhancing brain lesions consistent with metastatic disease. Many are fairly subtle on postcontrast imaging, although marked on series 16, and best seen by diffusion. At least 22 lesions are present. The largest is along the high left frontal cortex and measures 18 mm. A notable lesion without edema is seen in the right cerebral peduncle. Limited brain edema at metastatic disease. Prominent mineralization in the deep cerebellum from uncertain insult, no excessive mineralization in the basal ganglia. There are enlarged endolymphatic sacs correlating with history of deafness. No acute infarct, hydrocephalus, or collection. Vascular: Normal flow voids. Skull and upper cervical spine: Normal marrow signal. Sinuses/Orbits: Negative. These results will be called to the ordering clinician or representative by the Radiologist Assistant, and communication documented in the PACS or [employer] IMPRESSION: Extensive cerebral metastatic disease  with at least 22 lesions. The largest measures 18 mm along the left frontal cortex. No worrisome mass effect. Electronically Signed   By: JJorje GuildM.D.   On: 08/02/2022 10:20   NM PET Image Initial (PI) Skull Base To Thigh  Result Date: 07/28/2022 CLINICAL DATA:  Initial treatment strategy for lung mass. EXAM: NUCLEAR MEDICINE PET SKULL BASE TO THIGH TECHNIQUE: 9.1 mCi F-18 FDG was injected intravenously. Full-ring PET imaging was performed from the skull base to thigh after the radiotracer. CT data was obtained and used for attenuation correction and anatomic localization. Fasting blood glucose: 104 mg/dl COMPARISON:  CT chest 06/19/2022. FINDINGS: Mediastinal blood pool activity: SUV max 3.3 Liver activity: SUV max NA NECK: No abnormal hypermetabolism. Incidental CT findings: None. CHEST: Masslike consolidation in the left lower lobe, better measured on 06/19/2022, SUV max 13.2. Focal hypermetabolism in the mid esophagus, just below the level of the carina, SUV max 7.5. No definite CT correlate. Focal intercostal uptake between the posterior left second third ribs, SUV max 4.3, corresponding to an 8 mm  extrapleural lymph node (4/51). Small left axillary lymph nodes do not show metabolism above blood pool. Incidental CT findings: Thyroidectomy. Atherosclerotic calcification of the aorta and coronary arteries. Heart is at the upper limits of normal in size. Obstructed left lower lobe bronchus with new collapse/consolidation in the left lower lobe. Centrilobular and paraseptal emphysema. No pericardial effusion. Small left pleural effusion. ABDOMEN/PELVIS: Hypermetabolic bilateral adrenal masses. Index left adrenal mass measures 3.6 x 4.2 cm, SUV max 12.2. Nodule hypermetabolism throughout the pancreas. Index nodule in the body of the pancreas, SUV max 10.9. Correlation with CT is challenging without IV or oral contrast. Hypermetabolic nodules are seen in the oblique abdominal wall musculature of the left  lower quadrant and lateral to the descending colon with index 11 mm nodule (4/124), SUV max 7.2. Small upper abdominal lymph nodes are hypermetabolic with index gastrohepatic ligament lymph node measuring 7 mm (4/107), SUV max 10.9. No abnormal hypermetabolism in the liver or spleen. Incidental CT findings: Liver is unremarkable. Cholecystectomy. Bilateral adrenal masses are discussed above. Subcentimeter lesion in the right kidney, too small to characterize. Kidneys, spleen, pancreas and stomach are otherwise unremarkable with the exception of a small hiatal hernia. Right hemicolectomy. Atherosclerotic calcification of the aorta. SKELETON: No abnormal hypermetabolism. Incidental CT findings: Degenerative changes in the spine. IMPRESSION: 1. Stage IV primary bronchogenic carcinoma as evidenced by a hypermetabolic obstructing left lower lobe mass with postobstructive collapse/consolidation in the left lower lobe with the following hypermetabolic metastases: Left extrapleural lymph node, bilateral adrenal masses, pancreatic lesions, upper abdominal lymph nodes and soft tissue nodules in the abdomen and abdominal wall. 2. Focal hypermetabolism in the mid esophagus, without a definite CT correlate. Consider esophagram in further evaluation, as clinically indicated, as malignancy cannot be excluded. 3. Small left pleural effusion. 4. Aortic atherosclerosis (ICD10-I70.0). Coronary artery calcification. 5.  Emphysema (ICD10-J43.9). Electronically Signed   By: Lorin Picket M.D.   On: 07/28/2022 14:10   DG CHEST PORT 1 VIEW  Result Date: 07/24/2022 CLINICAL DATA:  Status post bronchoscopy and biopsy EXAM: PORTABLE CHEST 1 VIEW COMPARISON:  In 06/03/2022, 06/19/2022 FINDINGS: Normal heart size. Known left hilar mass, better seen by recent CT. No new airspace consolidation. No pleural effusion or pneumothorax. IMPRESSION: No pneumothorax following bronchoscopy. Electronically Signed   By: Davina Poke D.O.   On:  07/24/2022 12:35    Microbiology: Recent Results (from the past 240 hour(s))  Resp panel by RT-PCR (RSV, Flu A&B, Covid) Anterior Nasal Swab     Status: None   Collection Time: 08/19/22  2:40 PM   Specimen: Anterior Nasal Swab  Result Value Ref Range Status   SARS Coronavirus 2 by RT PCR NEGATIVE NEGATIVE Final    Comment: (NOTE) SARS-CoV-2 target nucleic acids are NOT DETECTED.  The SARS-CoV-2 RNA is generally detectable in upper respiratory specimens during the acute phase of infection. The lowest concentration of SARS-CoV-2 viral copies this assay can detect is 138 copies/mL. A negative result does not preclude SARS-Cov-2 infection and should not be used as the sole basis for treatment or other patient management decisions. A negative result may occur with  improper specimen collection/handling, submission of specimen other than nasopharyngeal swab, presence of viral mutation(s) within the areas targeted by this assay, and inadequate number of viral copies(<138 copies/mL). A negative result must be combined with clinical observations, patient history, and epidemiological information. The expected result is Negative.  Fact Sheet for Patients:  EntrepreneurPulse.com.au  Fact Sheet for Healthcare Providers:  IncredibleEmployment.be  This test  is no t yet approved or cleared by the [place] and  has been authorized for detection and/or diagnosis of SARS-CoV-2 by FDA under an Emergency Use Authorization (EUA). This EUA will remain  in effect (meaning this test can be used) for the duration of the COVID-19 declaration under Section 564(b)(1) of the Act, 21 U.S.C.section 360bbb-3(b)(1), unless the authorization is terminated  or revoked sooner.       Influenza A by PCR NEGATIVE NEGATIVE Final   Influenza B by PCR NEGATIVE NEGATIVE Final    Comment: (NOTE) The Xpert Xpress SARS-CoV-2/FLU/RSV plus assay is intended as an aid in the  diagnosis of influenza from Nasopharyngeal swab specimens and should not be used as a sole basis for treatment. Nasal washings and aspirates are unacceptable for Xpert Xpress SARS-CoV-2/FLU/RSV testing.  Fact Sheet for Patients: EntrepreneurPulse.com.au  Fact Sheet for Healthcare Providers: IncredibleEmployment.be  This test is not yet approved or cleared by the [place] FDA and has been authorized for detection and/or diagnosis of SARS-CoV-2 by FDA under an Emergency Use Authorization (EUA). This EUA will remain in effect (meaning this test can be used) for the duration of the COVID-19 declaration under Section 564(b)(1) of the Act, 21 U.S.C. section 360bbb-3(b)(1), unless the authorization is terminated or revoked.     Resp Syncytial Virus by PCR NEGATIVE NEGATIVE Final    Comment: (NOTE) Fact Sheet for Patients: EntrepreneurPulse.com.au  Fact Sheet for Healthcare Providers: IncredibleEmployment.be  This test is not yet approved or cleared by the [place] FDA and has been authorized for detection and/or diagnosis of SARS-CoV-2 by FDA under an Emergency Use Authorization (EUA). This EUA will remain in effect (meaning this test can be used) for the duration of the COVID-19 declaration under Section 564(b)(1) of the Act, 21 U.S.C. section 360bbb-3(b)(1), unless the authorization is terminated or revoked.  Performed at [facility], [place] [address], [place], [place] [address]   Blood culture (routine x 2)     Status: None (Preliminary result)   Collection Time: 08/19/22  4:57 PM   Specimen: BLOOD  Result Value Ref Range Status   Specimen Description   Final    BLOOD SITE NOT SPECIFIED Performed at [facility] [address] [place], [place] [address]    Special Requests   Final    BOTTLES DRAWN AEROBIC AND ANAEROBIC Blood Culture results may not be  optimal due to an excessive volume of blood received in culture bottles Performed at [facility] [address], [place], [place] [address]    Culture   Final    NO GROWTH 3 DAYS Performed at [facility], [place] [address] [place], [place] [address]    Report Status PENDING  Incomplete  Blood culture (routine x 2)     Status: None (Preliminary result)   Collection Time: 08/19/22  6:42 PM   Specimen: BLOOD LEFT FOREARM  Result Value Ref Range Status   Specimen Description   Final    BLOOD LEFT FOREARM Performed at [facility], [place] [address] [place], [place] [address]    Special Requests   Final    BOTTLES DRAWN AEROBIC AND ANAEROBIC Blood Culture adequate volume Performed at [facility] [address] [place], [place] [address]    Culture   Final    NO GROWTH 3 DAYS Performed at [facility], [place] [address], [place], [place] [address]    Report Status PENDING  Incomplete  C Difficile Quick Screen w PCR reflex     Status: Abnormal   Collection Time: 08/20/22  3:50 PM   Specimen: STOOL  Result Value Ref Range Status   C Diff antigen POSITIVE (A) NEGATIVE Final   C Diff toxin NEGATIVE NEGATIVE Final   C Diff interpretation Results are indeterminate. See PCR results.  Final    Comment: Performed at [facility], [place] [address], [place], [place] [address]  Gastrointestinal Panel by PCR , Stool     Status: None   Collection Time: 08/20/22  3:50 PM   Specimen: Stool  Result Value Ref Range Status   Campylobacter species NOT DETECTED NOT DETECTED Final   Plesimonas shigelloides NOT DETECTED NOT DETECTED Final   Salmonella species NOT DETECTED NOT DETECTED Final   Yersinia enterocolitica NOT DETECTED NOT DETECTED Final   Vibrio species NOT DETECTED NOT DETECTED Final   Vibrio cholerae NOT DETECTED NOT DETECTED Final   Enteroaggregative E coli (EAEC) NOT DETECTED NOT DETECTED Final   Enteropathogenic E  coli (EPEC) NOT DETECTED NOT DETECTED Final   Enterotoxigenic E coli (ETEC) NOT DETECTED NOT DETECTED Final   Shiga like toxin producing E coli (STEC) NOT DETECTED NOT DETECTED Final   Shigella/Enteroinvasive E coli (EIEC) NOT DETECTED NOT DETECTED Final   Cryptosporidium NOT DETECTED NOT DETECTED Final   Cyclospora cayetanensis NOT DETECTED NOT DETECTED Final   Entamoeba histolytica NOT DETECTED NOT DETECTED Final   Giardia lamblia NOT DETECTED NOT DETECTED Final   Adenovirus F40/41 NOT DETECTED NOT DETECTED Final   Astrovirus NOT DETECTED NOT DETECTED Final   Norovirus GI/GII NOT DETECTED NOT DETECTED Final   Rotavirus A NOT DETECTED NOT DETECTED Final   Sapovirus (I, II, IV, and V) NOT DETECTED NOT DETECTED Final    Comment: Performed at [facility], [place] [place], [place] [address]  C. Diff by PCR, Reflexed     Status: None   Collection Time: 08/20/22  3:50 PM  Result Value Ref Range Status   Toxigenic C. Difficile by PCR NEGATIVE NEGATIVE Final    Comment: Patient is colonized with non toxigenic C. difficile. May not need treatment unless significant symptoms are present. Performed at [facility], [place] [address] [place],  [address]      Labs: Basic Metabolic Panel: Recent Labs  Lab 08/19/22 1001 08/19/22 1657 08/19/22 1711 08/20/22 0012 08/20/22 0310 08/21/22 0510 08/22/22 0257  NA 126*  --  127*  --  132* 135 138  K 5.0  --  5.4*  --  4.3 4.0 3.8  CL 94*  --  100  --  103 109 112*  CO2 16*  --   --   --  17* 18* 18*  GLUCOSE 164*  --  140*  --  183* 91 98  BUN 97*  --  111*  --  83* 63* 40*  CREATININE 5.66*  --  7.20*  --  4.67* 2.72* 1.72*  CALCIUM 7.1*  --   --   --  6.3* 6.0* 5.9*  MG  --   --   --  1.5*  --   --   --   PHOS  --  6.3*  --   --  4.8*  --   --    Liver Function Tests: Recent Labs  Lab 08/19/22 1001 08/20/22 0310 08/21/22 0510  AST 17  --  25  ALT 18  --  23  ALKPHOS 65  --  55  BILITOT 0.9  --  0.3   PROT 7.4  --  6.1*  ALBUMIN 3.4* 2.4* 2.0*   Recent Labs  Lab 08/19/22 1657  LIPASE 55*   No results for input(s): "AMMONIA" in the last 168 hours. CBC: Recent Labs  Lab 08/19/22 1001 08/19/22 1711 08/20/22 0012 08/21/22 0500 08/22/22 0257  WBC 4.4  --  2.1* 2.8* 3.6*  NEUTROABS 3.5  --   --   --  2.2  HGB 11.9* 10.9* 9.8* 9.4* 9.5*  HCT 33.2* 32.0* 28.5* 27.5* 28.4*  MCV 82.6  --  86.1 87.3 87.4  PLT 100*  --  66* 84* 110*   Cardiac Enzymes: No results for input(s): "CKTOTAL", "CKMB", "CKMBINDEX", "TROPONINI" in the last 168 hours. BNP: BNP (last 3 results) Recent Labs    03/30/22 0633  BNP 301.3*    ProBNP (last 3 results) No results for input(s): "PROBNP" in the last 8760 hours.  CBG: Recent Labs  Lab 08/21/22 0741  GLUCAP 97       Signed:  Nita Sells MD   [facility] 08/22/2022, 10:19 AM

## 2022-08-23 LAB — FUNGUS CULTURE WITH STAIN

## 2022-08-23 LAB — FUNGAL ORGANISM REFLEX

## 2022-08-23 LAB — FUNGUS CULTURE RESULT

## 2022-08-24 LAB — CULTURE, BLOOD (ROUTINE X 2)
Culture: NO GROWTH
Culture: NO GROWTH
Special Requests: ADEQUATE

## 2022-08-25 ENCOUNTER — Telehealth: Payer: Self-pay | Admitting: Pulmonary Disease

## 2022-08-25 ENCOUNTER — Inpatient Hospital Stay: Payer: Medicare HMO

## 2022-08-25 ENCOUNTER — Telehealth: Payer: Self-pay

## 2022-08-25 DIAGNOSIS — C3490 Malignant neoplasm of unspecified part of unspecified bronchus or lung: Secondary | ICD-10-CM

## 2022-08-25 MED ORDER — HYDROCODONE BIT-HOMATROP MBR 5-1.5 MG/5ML PO SOLN: 5.0000 mL | Freq: Four times a day (QID) | ORAL | 0 refills | Status: DC | PRN

## 2022-08-25 NOTE — Telephone Encounter (Signed)
Pt sister states brother is coughing and it's hindering his sleep. Sister is giving him OTC medicine but nothing is helping. Ask if we could sent a prescription in for pt. I informed pt's sister I will send message to a provider and someone will call her back. Pt's sister verbalized understanding.

## 2022-08-25 NOTE — Patient Outreach (Signed)
  Care Coordination TOC Note Transition Care Management Unsuccessful Follow-up Telephone Call  Date of discharge and from where:  [facility] 08/19/22-08/22/22  Attempts:  1st Attempt  Reason for unsuccessful TCM follow-up call:  Left voice message  Johnney Killian, RN, BSN, CCM Care Management Coordinator [facility] Phone: [phone]: [phone]

## 2022-08-25 NOTE — Telephone Encounter (Signed)
Called and spoke with pt's sister letting her know recs per JD and she verbalized understanding. Nothing further needed.

## 2022-08-25 NOTE — Telephone Encounter (Signed)
I sent in hycodane cough syrup that he can take as needed, mainly at night to help him sleep. This cough syrup has a narcotic medication which helps reduce the cough reflux but can cause drowsiness.  Brett Campbell

## 2022-08-25 NOTE — Telephone Encounter (Signed)
PT sister calling. PT has bad cough and can not sleep. Can we call in something for him? Her # is [phone]  Pharm: [facility] on [facility] [address] [facility]

## 2022-08-26 ENCOUNTER — Telehealth: Payer: Self-pay

## 2022-08-26 ENCOUNTER — Other Ambulatory Visit (HOSPITAL_COMMUNITY): Payer: Self-pay

## 2022-08-26 ENCOUNTER — Encounter: Payer: Self-pay | Admitting: Internal Medicine

## 2022-08-26 MED ORDER — HYDROCODONE BIT-HOMATROP MBR 5-1.5 MG/5ML PO SOLN
5.0000 mL | Freq: Four times a day (QID) | ORAL | 0 refills | Status: DC | PRN
  Filled 2022-08-26: qty 240, 12d supply, fill #0

## 2022-08-26 NOTE — Patient Outreach (Signed)
  Care Coordination TOC Note Transition Care Management Follow-up Telephone Call Date of discharge and from where: [facility] 08/22/22 How have you been since you were released from the hospital? Per patients sister, Brett Campbell, patient is doing okay, he is sleeping now". Any questions or concerns? Yes- Patient had a prescription called in for cough syrup and [place] did not have medication.  This Probation officer called [employer] and they do have the medication in stock.  Requested Dr. Erin Fulling send to [facility] as it could not be transferred because it is a narcotic.  Dr. Erin Fulling agreed to resend to [facility].  Items Reviewed: Did the pt receive and understand the discharge instructions provided? Yes  Medications obtained and verified?  Yes, except Hycodan which will be picked up later Other? No  Any new allergies since your discharge? No  Dietary orders reviewed? No Do you have support at home? Yes   Home Care and Equipment/Supplies: Were home health services ordered? no If so, what is the name of the agency? N/a  Has the agency set up a time to come to the patient's home? not applicable Were any new equipment or medical supplies ordered?  No What is the name of the medical supply agency? N/a Were you able to get the supplies/equipment? not applicable Do you have any questions related to the use of the equipment or supplies? No  Functional Questionnaire: (I = Independent and D = Dependent) ADLs: I  Bathing/Dressing- I  Meal Prep- I  Eating- I  Maintaining continence- I  Transferring/Ambulation- I  Managing Meds- I  Follow up appointments reviewed:  PCP Hospital f/u appt confirmed? No   Specialist Hospital f/u appt confirmed? Yes  Scheduled to see Natalia Leatherwood (oncology) on 08/31/22 @ 11:00. Are transportation arrangements needed? No  If their condition worsens, is the pt aware to call PCP or go to the Emergency Dept.? Yes Was the  patient provided with contact information for the PCP's office or ED? Yes Was to pt encouraged to call back with questions or concerns? Yes  SDOH assessments and interventions completed:   Yes SDOH Interventions Today    Flowsheet Row Most Recent Value  SDOH Interventions   Food Insecurity Interventions Intervention Not Indicated  Housing Interventions Intervention Not Indicated       Care Coordination Interventions:  No Care Coordination interventions needed at this time.   Encounter Outcome:  Pt. Visit Completed

## 2022-08-26 NOTE — Addendum Note (Signed)
Addended by: Freda Jackson on: 08/26/2022 01:58 PM   Modules accepted: Orders

## 2022-08-28 MED FILL — Dexamethasone Sodium Phosphate Inj 100 MG/10ML: INTRAMUSCULAR | Qty: 1 | Status: AC

## 2022-08-28 MED FILL — Fosaprepitant Dimeglumine For IV Infusion 150 MG (Base Eq): INTRAVENOUS | Qty: 5 | Status: AC

## 2022-08-30 NOTE — Progress Notes (Unsigned)
[facility] OFFICE PROGRESS NOTE  [name], NP [place] [address]  DIAGNOSIS: Extensive stage (T4, N3, M1 C) small cell lung cancer presented with obstructing left lower lobe lung mass with left extrapleural lymph node, bilateral adrenal metastasis, pancreatic lesion as well as upper abdominal lymphadenopathy and soft tissue nodules in the abdominal wall as well as the abdomen diagnosed in December 2023.   PRIOR THERAPY: None   CURRENT THERAPY: Systemic chemotherapy with carboplatin for AUC of 5 on day 1, etoposide 100 Mg/M2 on days 1, 2 and 3 with Cosela 240 Mg/M2 on the days of the chemotherapy and Imfinzi 1500 Mg IV every 3 weeks. First dose August 10, 2022. Status post 1 cycle. Starting from cycle #2, dose of carboplatin reduced to AUC of 4 and etoposide 90 mg/m2.   INTERVAL HISTORY: Brett Campbell 62 y.o. male returns to the clinic today for a follow-up visit accompanied by his sister and his sign interpreter.  The patient was recently diagnosed with extensive stage small cell lung cancer.  The patient underwent his first cycle of treatment and presented to the clinic for 1 week follow-up with increasing fatigue, weakness, decreased appetite, nausea, and vomiting.  His labs showed significant acute kidney injury, hypotension, and tachycardia and he was subsequently sent to the emergency room where he was admitted from 12/27-12/30/2023.  While in the hospita,l he received 3 L of fluid.  There was also questionable tumor lysis syndrome for which the patient had elevated uric acid and phosphate.  He received 1 dose of rasburicase.  His C. difficile testing for his diarrhea was negative.  There is also questionable postobstructive pneumonia for which the patient received ceftriaxone and azithromycin.  He was discharged with Levaquin.  It was also noted that the patient was on lisinopril and have been taking ibuprofen.  Since being discharged,  the patient is feeling a lot better today.  He reports his energy is "good" although some days are better than others and he sometimes is still tired.  The diarrhea has resolved.  Patient was drinking protein supplemental drinks but believes this may have exacerbated his diarrhea and stopped taking them.  Despite completing antibiotics, the patient has a persistent dry cough which may be secondary to his malignancy.  He is Hycodan which helps.  Associated fevers.  He denies any fever, chills, or night sweats.  He lost approximately 4 pounds.   He denies any shortness of breath hemoptysis, or chest pain.  He has some mild nausea without any recent vomiting except he may have emesis secondary to a coughing fit.  Denies any constipation.  Denies any headache or visual changes.  Denies any rashes or skin changes.  He is here today for evaluation repeat blood work before undergoing cycle #2.   MEDICAL HISTORY: Past Medical History:  Diagnosis Date   Calcium deficiency    Cataract    COPD (chronic obstructive pulmonary disease) ([facility])    moderate emphysema   Deaf    Hypertension    Hypothyroidism    Personal history of colonic polyps 11/03/2020   Pneumonia    August 2023   Scarlet fever    Sleep apnea    never used a Cpap, used to use O2   Thyroid disease    Tuberculosis    [place]. Health department is contact with him due to hx.    ALLERGIES:  is allergic to penicillins.  MEDICATIONS:  Current Outpatient  Medications  Medication Sig Dispense Refill   amLODipine (NORVASC) 5 MG tablet Take 1 tablet (5 mg total) by mouth daily. 90 tablet 3   atorvastatin (LIPITOR) 10 MG tablet TAKE 1 TABLET EVERY DAY 90 tablet 3   calcitRIOL (ROCALTROL) 0.25 MCG capsule Take 1 capsule (0.25 mcg total) by mouth daily. 90 capsule 3   calcium carbonate (TUMS) 500 MG chewable tablet Chew 1 tablet (200 mg of elemental calcium total) by mouth 3 (three) times daily. 90 tablet 3   colestipol (COLESTID) 1 g tablet  Take 1 tablet (1 g total) by mouth 2 (two) times daily. (Patient taking differently: Take 1 g by mouth daily.) 60 tablet 5   guaifenesin (MUCUS RELIEF CHEST CONGESTION) 400 MG TABS tablet Take 400 mg by mouth every 4 (four) hours as needed (cough).     HYDROcodone bit-homatropine (HYCODAN) 5-1.5 MG/5ML syrup Take 5 mLs by mouth every 6 hours as needed for cough. 240 mL 0   ibuprofen (ADVIL) 200 MG tablet Take 400-800 mg by mouth every 6 (six) hours as needed for moderate pain.     levofloxacin (LEVAQUIN) 500 MG tablet Take 1 tablet (500 mg total) by mouth every other day. 2 tablet 0   levothyroxine (SYNTHROID) 150 MCG tablet Take 1 tablet (150 mcg total) by mouth daily. (Patient taking differently: Take 150 mcg by mouth daily before breakfast.) 90 tablet 3   lidocaine-prilocaine (EMLA) cream Apply to affected area once 30 g 3   loperamide (IMODIUM A-D) 2 MG tablet Take 1 tablet (2 mg total) by mouth 4 (four) times daily as needed for diarrhea or loose stools. 30 tablet 0   ondansetron (ZOFRAN) 8 MG tablet Take 1 tablet (8 mg total) by mouth every 8 (eight) hours as needed for nausea, vomiting or refractory nausea / vomiting. Start on the third day after carboplatin. (Patient taking differently: Take 8 mg by mouth every 6 (six) hours as needed for nausea, vomiting or refractory nausea / vomiting. Start on the third day after carboplatin.) 30 tablet 1   potassium chloride SA (KLOR-CON M) 20 MEQ tablet TAKE 1 TABLET EVERY DAY 90 tablet 3   prochlorperazine (COMPAZINE) 10 MG tablet Take 1 tablet (10 mg total) by mouth every 6 (six) hours as needed. (Patient taking differently: Take 10 mg by mouth every 6 (six) hours as needed for nausea or vomiting.) 30 tablet 2   No current facility-administered medications for this visit.    SURGICAL HISTORY:  Past Surgical History:  Procedure Laterality Date   CHOLECYSTECTOMY     COLONOSCOPY     +5years   IR IMAGING GUIDED PORT INSERTION  08/14/2022    LAPAROSCOPIC RIGHT HEMI COLECTOMY N/A 02/26/2021   Procedure: LAPAROSCOPIC RIGHT HEMI COLECTOMY;  Surgeon: Ileana Roup, MD;  Location: [facility];  Service: General;  Laterality: N/A;   THYROIDECTOMY     VIDEO BRONCHOSCOPY WITH ENDOBRONCHIAL ULTRASOUND N/A 07/24/2022   Procedure: VIDEO BRONCHOSCOPY WITH ENDOBRONCHIAL ULTRASOUND  WITH LEFT LOWER LOBE  MASS BIOPSIES;  Surgeon: [name], MD;  Location: [facility];  Service: Pulmonary;  Laterality: N/A;    REVIEW OF SYSTEMS:   Review of Systems  Constitutional: Positive for improving fatigue compared to last visit.  Positive for weight loss.  Negative for appetite change, chills, and fever.  HENT: Negative for mouth sores, nosebleeds, sore throat and trouble swallowing.   Eyes: Negative for eye problems and icterus.  Respiratory: Positive for cough.  Negative for hemoptysis, shortness of breath  and wheezing.   Cardiovascular: Negative for chest pain and leg swelling.  Gastrointestinal: Positive for nausea.  Diarrhea and vomiting improved.  Negative for abdominal pain and constipation.  Genitourinary: Negative for bladder incontinence, difficulty urinating, dysuria, frequency and hematuria.   Musculoskeletal: Negative for back pain, gait problem, neck pain and neck stiffness.  Skin: Negative for itching and rash.  Neurological: Negative for dizziness, extremity weakness, gait problem, headaches, light-headedness and seizures.  Hematological: Negative for adenopathy. Does not bruise/bleed easily.  Psychiatric/Behavioral: Negative for confusion, depression and sleep disturbance. The patient is not nervous/anxious.     PHYSICAL EXAMINATION:  Blood pressure (!) 124/91, pulse 90, temperature 97.6 F (36.4 C), temperature source Oral, resp. rate 17, weight 166 lb 1.6 oz (75.3 kg), SpO2 95 %.  ECOG PERFORMANCE STATUS: 1  Physical Exam  Constitutional: Oriented to person, place, and time and well-developed, well-nourished, and in no distress.   HENT:  Head: Normocephalic and atraumatic.  Mouth/Throat: Oropharynx is clear and moist. No oropharyngeal exudate.  Eyes: Conjunctivae are normal. Right eye exhibits no discharge. Left eye exhibits no discharge. No scleral icterus.  Neck: Normal range of motion. Neck supple.  Cardiovascular: Normal rate, regular rhythm, normal heart sounds and intact distal pulses.   Pulmonary/Chest: Effort normal and breath sounds normal. No respiratory distress. No wheezes. No rales.  Abdominal: Soft. Bowel sounds are normal. Exhibits no distension and no mass. There is no tenderness.  Musculoskeletal: Normal range of motion. Exhibits no edema.  Lymphadenopathy:    No cervical adenopathy.  Neurological: Alert and oriented to person, place, and time. Exhibits normal muscle tone. Gait normal. Coordination normal.  Skin: Skin is warm and dry. No rash noted. Not diaphoretic. No erythema. No pallor.  Psychiatric: Mood, memory and judgment normal.  Vitals reviewed.  LABORATORY DATA: Lab Results  Component Value Date   WBC 13.0 (H) 08/31/2022   HGB 11.8 (L) 08/31/2022   HCT 34.3 (L) 08/31/2022   MCV 86.4 08/31/2022   PLT 455 (H) 08/31/2022      Chemistry      Component Value Date/Time   NA 133 (L) 08/31/2022 1019   NA 139 04/28/2022 1038   K 4.9 08/31/2022 1019   CL 95 (L) 08/31/2022 1019   CO2 31 08/31/2022 1019   BUN 23 (H) 08/31/2022 1019   BUN 14 04/28/2022 1038   CREATININE 1.52 (H) 08/31/2022 1019      Component Value Date/Time   CALCIUM 8.7 (L) 08/31/2022 1019   ALKPHOS 65 08/31/2022 1019   AST 21 08/31/2022 1019   ALT 18 08/31/2022 1019   BILITOT 0.4 08/31/2022 1019       RADIOGRAPHIC STUDIES:  US RENAL  Result Date: September 01, 2022 CLINICAL DATA:  AKI EXAM: RENAL / URINARY TRACT ULTRASOUND COMPLETE COMPARISON:  PET/CT 07/27/2022 FINDINGS: Right Kidney: Renal measurements: 9.6 x 4.6 x 4.3 cm = volume: 99 mL. Echogenicity within normal limits. No mass or hydronephrosis visualized.  Left Kidney: Renal measurements: 11.2 x 5.3 x 4.6 cm = volume: 144 mL. Echogenicity within normal limits. No mass or hydronephrosis visualized. Bladder: Appears normal for degree of bladder distention. Other: None. IMPRESSION: Unremarkable renal ultrasound. Electronically Signed   By: Placido Sou M.D.   On: 09/01/22 21:17   DG Chest Port 1 View  Result Date: 2022/09/01 CLINICAL DATA:  Cough, tachypnea, history of lung cancer EXAM: PORTABLE CHEST 1 VIEW COMPARISON:  07/24/2022, 07/27/2022 FINDINGS: Single frontal view of the chest demonstrates right chest wall port via internal jugular approach  tip overlying the atriocaval junction. Complete consolidation of the left lower lobe consistent with known central obstructing mass and postobstructive change. There is some increasing ground-glass consolidation within the left lateral lung base, which could reflect superimposed left upper lobe pneumonia. No acute bony abnormality. IMPRESSION: 1. Stable left lower lobe consolidation consistent with known central obstructing malignancy and postobstructive consolidation. 2. New ground-glass consolidation at the left lateral lung base, which could reflect aspiration or infection within the left upper lobe. Electronically Signed   By: Randa Ngo M.D.   On: 08/19/2022 16:53   IR IMAGING GUIDED PORT INSERTION  Result Date: 08/14/2022 INDICATION: History of lung cancer. In need of durable intravenous access for chemotherapy administration. EXAM: IMPLANTED PORT A CATH PLACEMENT WITH ULTRASOUND AND FLUOROSCOPIC GUIDANCE COMPARISON:  PET-CT-07/27/2022 MEDICATIONS: None ANESTHESIA/SEDATION: Moderate (conscious) sedation was employed during this procedure as administered by the Interventional Radiology RN. A total of Versed 1 mg and Fentanyl 50 mcg was administered intravenously. Moderate Sedation Time: 25 minutes. The patient's level of consciousness and vital signs were monitored continuously by radiology nursing  throughout the procedure under my direct supervision. CONTRAST:  None FLUOROSCOPY TIME:  18 seconds (1 mGy) COMPLICATIONS: None immediate. PROCEDURE: The procedure, risks, benefits, and alternatives were explained to the patient. Questions regarding the procedure were encouraged and answered. The patient understands and consents to the procedure. The right neck and chest were prepped with chlorhexidine in a sterile fashion, and a sterile drape was applied covering the operative field. Maximum barrier sterile technique with sterile gowns and gloves were used for the procedure. A timeout was performed prior to the initiation of the procedure. Local anesthesia was provided with 1% lidocaine with epinephrine. After creating a small venotomy incision, a micropuncture kit was utilized to access the internal jugular vein. Real-time ultrasound guidance was utilized for vascular access including the acquisition of a permanent ultrasound image documenting patency of the accessed vessel. The microwire was utilized to measure appropriate catheter length. A subcutaneous port pocket was then created along the upper chest wall utilizing a combination of sharp and blunt dissection. The pocket was irrigated with sterile saline. A single lumen Slim sized power injectable port was chosen for placement. The 8 Fr catheter was tunneled from the port pocket site to the venotomy incision. The port was placed in the pocket. The external catheter was trimmed to appropriate length. At the venotomy, an 8 Fr peel-away sheath was placed over a guidewire under fluoroscopic guidance. The catheter was then placed through the sheath and the sheath was removed. Final catheter positioning was confirmed and documented with a fluoroscopic spot radiograph. The port was accessed with a Huber needle, aspirated and flushed with heparinized saline. The venotomy site was closed with an interrupted 4-0 Vicryl suture. The port pocket incision was closed with  interrupted 2-0 Vicryl suture. Dermabond and Steri-strips were applied to both incisions. Dressings were applied. The patient tolerated the procedure well without immediate post procedural complication. FINDINGS: After catheter placement, the tip lies within the superior cavoatrial junction. The catheter aspirates and flushes normally and is ready for immediate use. IMPRESSION: Successful placement of a right internal jugular approach power injectable Port-A-Cath. The catheter is ready for immediate use. Electronically Signed   By: Sandi Mariscal M.D.   On: 08/14/2022 15:59   MR BRAIN W WO CONTRAST  Result Date: 08/02/2022 CLINICAL DATA:  Small cell lung cancer. EXAM: MRI HEAD WITHOUT AND WITH CONTRAST TECHNIQUE: Multiplanar, multiecho pulse sequences of the brain and surrounding  structures were obtained without and with intravenous contrast. CONTRAST:  73mL GADAVIST GADOBUTROL 1 MMOL/ML IV SOLN COMPARISON:  None Available. FINDINGS: Brain: Numerous enhancing brain lesions consistent with metastatic disease. Many are fairly subtle on postcontrast imaging, although marked on series 16, and best seen by diffusion. At least 22 lesions are present. The largest is along the high left frontal cortex and measures 18 mm. A notable lesion without edema is seen in the right cerebral peduncle. Limited brain edema at metastatic disease. Prominent mineralization in the deep cerebellum from uncertain insult, no excessive mineralization in the basal ganglia. There are enlarged endolymphatic sacs correlating with history of deafness. No acute infarct, hydrocephalus, or collection. Vascular: Normal flow voids. Skull and upper cervical spine: Normal marrow signal. Sinuses/Orbits: Negative. These results will be called to the ordering clinician or representative by the Radiologist Assistant, and communication documented in the PACS or [employer]. 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 The  largest measures 18 mm along the left frontal cortex. No worrisome mass effect. Electronically Signed   By: Jorje Guild M.D.   On: 08/02/2022 10:20     ASSESSMENT/PLAN:  This is a very pleasant 62 year old Caucasian male with Extensive stage (T4, N3, M1 C) small cell lung cancer presented with obstructing left lower lobe lung mass with left extrapleural lymph node, bilateral adrenal metastasis, pancreatic lesion as well as upper abdominal lymphadenopathy and soft tissue nodules in the abdominal wall as well as the abdomen diagnosed in December 2023.   The patient started the first cycle of his systemic chemotherapy with carboplatin for AUC of 5 on day 1 and etoposide 100 Mg/M2 on days 1, 2 and 3 with Cosela before the chemotherapy and Imfinzi 1500 Mg IV on day 1 every 3 weeks.  Status post 1 cycle given on August 10, 2022.  He had significant acute kidney injury following his first cycle of treatment for which the patient was hospitalized for few days.  The patient was seen with Dr. Julien Nordmann today.  Dr. Julien Nordmann had a lengthy discussion with the patient and his family today.  Dr. Julien Nordmann recommends reducing the dose of his chemotherapy to carboplatin for an AUC of 4 and etoposide 90 mg/m, and Imfinzi.  We will continue to monitor labs closely on a weekly basis.  He was advised to hydrate well avoid ibuprofen.  We will arrange for IV fluids to be added on day 3 of cycle 2.  I have placed orders undersign on hold.  Discussed that he should take Imodium if needed for diarrhea.  We reviewed the dosing with the patient and his sister.   He has a prescription for Compazine if needed for nausea and vomiting.  He also has Zofran.  We discussed alternating this if needed for better control.  We will see him back for follow-up visit in 3 weeks for evaluation repeat blood work before undergoing cycle #3.  I will arrange for restaging CT scan of the chest, abdomen, pelvis prior to starting his next cycle  of treatment to restage his disease.  I will order this without contrast given his recent AKI.  Patient was advised to call the clinic to be evaluated sooner if he develops any nausea, vomiting, or diarrhea in the interval to avoid dehydration and hospital visits.  Patient likes the taste of the protein supplemental drinks but it exacerbated his diarrhea.  Discussed that he may try protein supplements with lower protein content or make his  own protein drinks.  Of course if this causes diarrhea, he will stop.  The patient was advised to call immediately if he has any concerning symptoms in the interval. The patient voices understanding of current disease status and treatment options and is in agreement with the current care plan. All questions were answered. The patient knows to call the clinic with any problems, questions or concerns. We can certainly see the patient much sooner if necessary          Orders Placed This Encounter  Procedures   CT Chest Wo Contrast    Standing Status:   Future    Standing Expiration Date:   08/31/2023    Order Specific Question:   Preferred imaging location?    Answer:   [facility]   CT Abdomen Pelvis Wo Contrast    Standing Status:   Future    Standing Expiration Date:   08/31/2023    Order Specific Question:   Preferred imaging location?    Answer:   [facility]    Order Specific Question:   Is Oral Contrast requested for this exam?    Answer:   Yes, Per Radiology protocol    Order Specific Question:   Does the patient have a contrast media/X-ray dye allergy?    Answer:   No      Brigham Cobbins L Wilbern Pennypacker, PA-C 08/31/22  ADDENDUM: Hematology/Oncology Attending:  I had a face-to-face encounter with the patient today.  I reviewed his record, lab and recommended his care plan.  This is a very pleasant 62 years old white male diagnosed with extensive stage small cell lung cancer in December 2023 status post 1 cycle of systemic  chemotherapy with carboplatin and etoposide on August 10, 2022.  The patient was admitted to the hospital 2 weeks after the first cycle with significant fatigue and dehydration as well as lack of appetite and acute renal failure.  He was also have signs of tumor lysis syndrome with hypocalcemia and hyperuricemia. He was treated with aggressive hydration and rasburicase.  He was also treated for suspicious pneumonia with ceftriaxone and azithromycin.  The patient is feeling much better now and he is here for evaluation before starting cycle #2 of his treatment. I recommended for him to proceed with the second cycle of his chemotherapy but I will reduce the dose of carboplatin to AUC of 4 on day 1 and 2 etoposide 90 Mg/M2 on days 1, 2 and 3 in addition to Cosela before the chemotherapy and Imfinzi on day 1. Will continue to monitor the patient closely with weekly blood work. Will arrange for him to receive IV hydration on day 3 of cycle #2. He will come back for follow-up visit in 3 weeks for evaluation with repeat CT scan of the chest, abdomen and pelvis for restaging of his disease. The patient was advised to call immediately if he has any other concerning symptoms in the interval. The total time spent in the appointment was 30 minutes. Disclaimer: This note was dictated with voice recognition software. Similar sounding words can inadvertently be transcribed and may be missed upon review. Eilleen Kempf, MD

## 2022-08-31 ENCOUNTER — Inpatient Hospital Stay (HOSPITAL_BASED_OUTPATIENT_CLINIC_OR_DEPARTMENT_OTHER): Payer: Medicare HMO | Admitting: Physician Assistant

## 2022-08-31 ENCOUNTER — Inpatient Hospital Stay: Payer: Medicare HMO

## 2022-08-31 ENCOUNTER — Other Ambulatory Visit: Payer: Medicare HMO

## 2022-08-31 VITALS — BP 116/64 | HR 78 | Resp 18

## 2022-08-31 VITALS — BP 124/91 | HR 90 | Temp 97.6°F | Resp 17 | Wt 166.1 lb

## 2022-08-31 DIAGNOSIS — C3490 Malignant neoplasm of unspecified part of unspecified bronchus or lung: Secondary | ICD-10-CM

## 2022-08-31 DIAGNOSIS — N179 Acute kidney failure, unspecified: Secondary | ICD-10-CM | POA: Insufficient documentation

## 2022-08-31 DIAGNOSIS — Z510 Encounter for antineoplastic radiation therapy: Secondary | ICD-10-CM | POA: Insufficient documentation

## 2022-08-31 DIAGNOSIS — C7972 Secondary malignant neoplasm of left adrenal gland: Secondary | ICD-10-CM | POA: Insufficient documentation

## 2022-08-31 DIAGNOSIS — C7931 Secondary malignant neoplasm of brain: Secondary | ICD-10-CM | POA: Insufficient documentation

## 2022-08-31 DIAGNOSIS — C7889 Secondary malignant neoplasm of other digestive organs: Secondary | ICD-10-CM | POA: Insufficient documentation

## 2022-08-31 DIAGNOSIS — C779 Secondary and unspecified malignant neoplasm of lymph node, unspecified: Secondary | ICD-10-CM | POA: Diagnosis not present

## 2022-08-31 DIAGNOSIS — I700 Atherosclerosis of aorta: Secondary | ICD-10-CM | POA: Insufficient documentation

## 2022-08-31 DIAGNOSIS — C7971 Secondary malignant neoplasm of right adrenal gland: Secondary | ICD-10-CM | POA: Insufficient documentation

## 2022-08-31 DIAGNOSIS — Z5111 Encounter for antineoplastic chemotherapy: Secondary | ICD-10-CM | POA: Insufficient documentation

## 2022-08-31 DIAGNOSIS — C3432 Malignant neoplasm of lower lobe, left bronchus or lung: Secondary | ICD-10-CM | POA: Insufficient documentation

## 2022-08-31 DIAGNOSIS — R197 Diarrhea, unspecified: Secondary | ICD-10-CM | POA: Insufficient documentation

## 2022-08-31 DIAGNOSIS — I10 Essential (primary) hypertension: Secondary | ICD-10-CM | POA: Insufficient documentation

## 2022-08-31 DIAGNOSIS — M479 Spondylosis, unspecified: Secondary | ICD-10-CM | POA: Diagnosis not present

## 2022-08-31 DIAGNOSIS — Z95828 Presence of other vascular implants and grafts: Secondary | ICD-10-CM

## 2022-08-31 DIAGNOSIS — Z5112 Encounter for antineoplastic immunotherapy: Secondary | ICD-10-CM | POA: Insufficient documentation

## 2022-08-31 LAB — CMP (CANCER CENTER ONLY)
ALT: 18 U/L (ref 0–44)
AST: 21 U/L (ref 15–41)
Albumin: 3.3 g/dL — ABNORMAL LOW (ref 3.5–5.0)
Alkaline Phosphatase: 65 U/L (ref 38–126)
Anion gap: 7 (ref 5–15)
BUN: 23 mg/dL — ABNORMAL HIGH (ref 6–20)
CO2: 31 mmol/L (ref 22–32)
Calcium: 8.7 mg/dL — ABNORMAL LOW (ref 8.9–10.3)
Chloride: 95 mmol/L — ABNORMAL LOW (ref 98–111)
Creatinine: 1.52 mg/dL — ABNORMAL HIGH (ref 0.61–1.24)
GFR, Estimated: 52 mL/min — ABNORMAL LOW (ref >60)
Glucose, Bld: 97 mg/dL (ref 70–99)
Potassium: 4.9 mmol/L (ref 3.5–5.1)
Sodium: 133 mmol/L — ABNORMAL LOW (ref 135–145)
Total Bilirubin: 0.4 mg/dL (ref 0.3–1.2)
Total Protein: 8 g/dL (ref 6.5–8.1)

## 2022-08-31 LAB — CBC WITH DIFFERENTIAL (CANCER CENTER ONLY)
Abs Immature Granulocytes: 0.21 10*3/uL — ABNORMAL HIGH (ref 0.00–0.07)
Basophils Absolute: 0.1 10*3/uL (ref 0.0–0.1)
Basophils Relative: 1 %
Eosinophils Absolute: 0 10*3/uL (ref 0.0–0.5)
Eosinophils Relative: 0 %
HCT: 34.3 % — ABNORMAL LOW (ref 39.0–52.0)
Hemoglobin: 11.8 g/dL — ABNORMAL LOW (ref 13.0–17.0)
Immature Granulocytes: 2 %
Lymphocytes Relative: 12 %
Lymphs Abs: 1.5 10*3/uL (ref 0.7–4.0)
MCH: 29.7 pg (ref 26.0–34.0)
MCHC: 34.4 g/dL (ref 30.0–36.0)
MCV: 86.4 fL (ref 80.0–100.0)
Monocytes Absolute: 1.2 10*3/uL — ABNORMAL HIGH (ref 0.1–1.0)
Monocytes Relative: 9 %
Neutro Abs: 9.9 10*3/uL — ABNORMAL HIGH (ref 1.7–7.7)
Neutrophils Relative %: 76 %
Platelet Count: 455 10*3/uL — ABNORMAL HIGH (ref 150–400)
RBC: 3.97 MIL/uL — ABNORMAL LOW (ref 4.22–5.81)
RDW: 12.6 % (ref 11.5–15.5)
WBC Count: 13 10*3/uL — ABNORMAL HIGH (ref 4.0–10.5)
nRBC: 0 % (ref 0.0–0.2)

## 2022-08-31 MED ORDER — SODIUM CHLORIDE 0.9 % IV SOLN
1500.0000 mg | Freq: Once | INTRAVENOUS | Status: AC
  Administered 2022-08-31: 1500 mg via INTRAVENOUS
  Filled 2022-08-31: qty 30

## 2022-08-31 MED ORDER — TRILACICLIB DIHYDROCHLORIDE INJECTION 300 MG
240.0000 mg/m2 | Freq: Once | INTRAVENOUS | Status: AC
  Administered 2022-08-31: 480 mg via INTRAVENOUS
  Filled 2022-08-31: qty 32

## 2022-08-31 MED ORDER — SODIUM CHLORIDE 0.9% FLUSH
10.0000 mL | INTRAVENOUS | Status: AC | PRN
  Administered 2022-08-31: 10 mL

## 2022-08-31 MED ORDER — PALONOSETRON HCL INJECTION 0.25 MG/5ML
0.2500 mg | Freq: Once | INTRAVENOUS | Status: AC
  Administered 2022-08-31: 0.25 mg via INTRAVENOUS
  Filled 2022-08-31: qty 5

## 2022-08-31 MED ORDER — SODIUM CHLORIDE 0.9 % IV SOLN
10.0000 mg | Freq: Once | INTRAVENOUS | Status: AC
  Administered 2022-08-31: 10 mg via INTRAVENOUS
  Filled 2022-08-31: qty 10

## 2022-08-31 MED ORDER — SODIUM CHLORIDE 0.9 % IV SOLN: Freq: Once | INTRAVENOUS | Status: AC

## 2022-08-31 MED ORDER — SODIUM CHLORIDE 0.9 % IV SOLN
90.0000 mg/m2 | Freq: Once | INTRAVENOUS | Status: AC
  Administered 2022-08-31: 180 mg via INTRAVENOUS
  Filled 2022-08-31: qty 9

## 2022-08-31 MED ORDER — HEPARIN SOD (PORK) LOCK FLUSH 100 UNIT/ML IV SOLN
500.0000 [IU] | Freq: Once | INTRAVENOUS | Status: AC | PRN
  Administered 2022-08-31: 500 [IU]

## 2022-08-31 MED ORDER — SODIUM CHLORIDE 0.9% FLUSH
10.0000 mL | INTRAVENOUS | Status: DC | PRN
  Administered 2022-08-31: 10 mL

## 2022-08-31 MED ORDER — SODIUM CHLORIDE 0.9 % IV SOLN
150.0000 mg | Freq: Once | INTRAVENOUS | Status: AC
  Administered 2022-08-31: 150 mg via INTRAVENOUS
  Filled 2022-08-31: qty 150

## 2022-08-31 MED ORDER — SODIUM CHLORIDE 0.9 % IV SOLN
332.4000 mg | Freq: Once | INTRAVENOUS | Status: AC
  Administered 2022-08-31: 350 mg via INTRAVENOUS
  Filled 2022-08-31: qty 35

## 2022-08-31 MED FILL — Dexamethasone Sodium Phosphate Inj 100 MG/10ML: INTRAMUSCULAR | Qty: 1 | Status: AC

## 2022-08-31 NOTE — Progress Notes (Signed)
Per Cassie PA-C OK to proceed with tx with elevated SCR 1.52 mg/dL today

## 2022-08-31 NOTE — Patient Instructions (Signed)
[facility] ONCOL[facility]  Discharge Instructions: Thank you for choosing [facility] to provide your oncology and hematology care.   If you have a lab appointment with the [facility], please go directly to the [facility] and check in at the registration area.   Wear comfortable clothing and clothing appropriate for easy access to any Portacath or PICC line.   We strive to give you quality time with your provider. You may need to reschedule your appointment if you arrive late (15 or more minutes).  Arriving late affects you and other patients whose appointments are after yours.  Also, if you miss three or more appointments without notifying the office, you may be dismissed from the clinic at the provider's discretion.      For prescription refill requests, have your pharmacy contact our office and allow 72 hours for refills to be completed.    Today you received the following chemotherapy and/or immunotherapy agents: Cosela/Imfinzi/Carboplatin/Etoposide    To help prevent nausea and vomiting after your treatment, we encourage you to take your nausea medication as directed.  BELOW ARE SYMPTOMS THAT SHOULD BE REPORTED IMMEDIATELY: *FEVER GREATER THAN 100.4 F (38 C) OR HIGHER *CHILLS OR SWEATING *NAUSEA AND VOMITING THAT IS NOT CONTROLLED WITH YOUR NAUSEA MEDICATION *UNUSUAL SHORTNESS OF BREATH *UNUSUAL BRUISING OR BLEEDING *URINARY PROBLEMS (pain or burning when urinating, or frequent urination) *BOWEL PROBLEMS (unusual diarrhea, constipation, pain near the anus) TENDERNESS IN MOUTH AND THROAT WITH OR WITHOUT PRESENCE OF ULCERS (sore throat, sores in mouth, or a toothache) UNUSUAL RASH, SWELLING OR PAIN  UNUSUAL VAGINAL DISCHARGE OR ITCHING   Items with * indicate a potential emergency and should be followed up as soon as possible or go to the Emergency Department if any problems should occur.  Please show the CHEMOTHERAPY ALERT CARD or IMMUNOTHERAPY  ALERT CARD at check-in to the Emergency Department and triage nurse.  Should you have questions after your visit or need to cancel or reschedule your appointment, please contact [facility]  Dept: [phone]  and follow the prompts.  Office hours are 8:00 a.m. to 4:30 p.m. Monday - Friday. Please note that voicemails left after 4:00 p.m. may not be returned until the following business day.  We are closed weekends and major holidays. You have access to a nurse at all times for urgent questions. Please call the main number to the clinic Dept: [phone] and follow the prompts.   For any non-urgent questions, you may also contact your provider using MyChart. We now offer e-Visits for anyone 33 and older to request care online for non-urgent symptoms. For details visit [email or web address].   Also download the MyChart app! Go to the app store, search "MyChart", open the app, select [facility], and log in with your MyChart username and password.

## 2022-09-01 ENCOUNTER — Inpatient Hospital Stay: Payer: Medicare HMO

## 2022-09-01 VITALS — BP 112/61 | HR 72 | Temp 97.7°F | Resp 20

## 2022-09-01 DIAGNOSIS — N179 Acute kidney failure, unspecified: Secondary | ICD-10-CM | POA: Diagnosis not present

## 2022-09-01 DIAGNOSIS — C3490 Malignant neoplasm of unspecified part of unspecified bronchus or lung: Secondary | ICD-10-CM

## 2022-09-01 DIAGNOSIS — C7931 Secondary malignant neoplasm of brain: Secondary | ICD-10-CM | POA: Diagnosis not present

## 2022-09-01 DIAGNOSIS — C7971 Secondary malignant neoplasm of right adrenal gland: Secondary | ICD-10-CM | POA: Diagnosis not present

## 2022-09-01 DIAGNOSIS — Z510 Encounter for antineoplastic radiation therapy: Secondary | ICD-10-CM | POA: Diagnosis not present

## 2022-09-01 DIAGNOSIS — C779 Secondary and unspecified malignant neoplasm of lymph node, unspecified: Secondary | ICD-10-CM | POA: Diagnosis not present

## 2022-09-01 DIAGNOSIS — C3432 Malignant neoplasm of lower lobe, left bronchus or lung: Secondary | ICD-10-CM | POA: Diagnosis not present

## 2022-09-01 DIAGNOSIS — C7972 Secondary malignant neoplasm of left adrenal gland: Secondary | ICD-10-CM | POA: Diagnosis not present

## 2022-09-01 DIAGNOSIS — C7889 Secondary malignant neoplasm of other digestive organs: Secondary | ICD-10-CM | POA: Diagnosis not present

## 2022-09-01 DIAGNOSIS — Z5111 Encounter for antineoplastic chemotherapy: Secondary | ICD-10-CM | POA: Diagnosis not present

## 2022-09-01 MED ORDER — HEPARIN SOD (PORK) LOCK FLUSH 100 UNIT/ML IV SOLN
500.0000 [IU] | Freq: Once | INTRAVENOUS | Status: AC | PRN
  Administered 2022-09-01: 500 [IU]

## 2022-09-01 MED ORDER — SODIUM CHLORIDE 0.9 % IV SOLN
10.0000 mg | Freq: Once | INTRAVENOUS | Status: AC
  Administered 2022-09-01: 10 mg via INTRAVENOUS
  Filled 2022-09-01: qty 10

## 2022-09-01 MED ORDER — SODIUM CHLORIDE 0.9 % IV SOLN: Freq: Once | INTRAVENOUS | Status: AC

## 2022-09-01 MED ORDER — SODIUM CHLORIDE 0.9% FLUSH
10.0000 mL | INTRAVENOUS | Status: DC | PRN
  Administered 2022-09-01: 10 mL

## 2022-09-01 MED ORDER — SODIUM CHLORIDE 0.9 % IV SOLN
90.0000 mg/m2 | Freq: Once | INTRAVENOUS | Status: AC
  Administered 2022-09-01: 180 mg via INTRAVENOUS
  Filled 2022-09-01: qty 9

## 2022-09-01 MED ORDER — TRILACICLIB DIHYDROCHLORIDE INJECTION 300 MG
240.0000 mg/m2 | Freq: Once | INTRAVENOUS | Status: AC
  Administered 2022-09-01: 480 mg via INTRAVENOUS
  Filled 2022-09-01: qty 32

## 2022-09-01 MED FILL — Dexamethasone Sodium Phosphate Inj 100 MG/10ML: INTRAMUSCULAR | Qty: 1 | Status: AC

## 2022-09-01 NOTE — Patient Instructions (Signed)
[facility]  Discharge Instructions: Thank you for choosing [facility] to provide your oncology and hematology care.   If you have a lab appointment with the Cancer Center, please go directly to the Cancer Center and check in at the registration area.   Wear comfortable clothing and clothing appropriate for easy access to any Portacath or PICC line.   We strive to give you quality time with your provider. You may need to reschedule your appointment if you arrive late (15 or more minutes).  Arriving late affects you and other patients whose appointments are after yours.  Also, if you miss three or more appointments without notifying the office, you may be dismissed from the clinic at the provider's discretion.      For prescription refill requests, have your pharmacy contact our office and allow 72 hours for refills to be completed.    Today you received the following chemotherapy and/or immunotherapy agents; Cosela & Etoposide      To help prevent nausea and vomiting after your treatment, we encourage you to take your nausea medication as directed.  BELOW ARE SYMPTOMS THAT SHOULD BE REPORTED IMMEDIATELY: *FEVER GREATER THAN 100.4 F (38 C) OR HIGHER *CHILLS OR SWEATING *NAUSEA AND VOMITING THAT IS NOT CONTROLLED WITH YOUR NAUSEA MEDICATION *UNUSUAL SHORTNESS OF BREATH *UNUSUAL BRUISING OR BLEEDING *URINARY PROBLEMS (pain or burning when urinating, or frequent urination) *BOWEL PROBLEMS (unusual diarrhea, constipation, pain near the anus) TENDERNESS IN MOUTH AND THROAT WITH OR WITHOUT PRESENCE OF ULCERS (sore throat, sores in mouth, or a toothache) UNUSUAL RASH, SWELLING OR PAIN  UNUSUAL VAGINAL DISCHARGE OR ITCHING   Items with * indicate a potential emergency and should be followed up as soon as possible or go to the Emergency Department if any problems should occur.  Please show the CHEMOTHERAPY ALERT CARD or IMMUNOTHERAPY ALERT CARD at  check-in to the Emergency Department and triage nurse.  Should you have questions after your visit or need to cancel or reschedule your appointment, please contact [facility]  Dept: [phone]  and follow the prompts.  Office hours are 8:00 a.m. to 4:30 p.m. Monday - Friday. Please note that voicemails left after 4:00 p.m. may not be returned until the following business day.  We are closed weekends and major holidays. You have access to a nurse at all times for urgent questions. Please call the main number to the clinic Dept: [phone] and follow the prompts.   For any non-urgent questions, you may also contact your provider using MyChart. We now offer e-Visits for anyone 18 and older to request care online for non-urgent symptoms. For details visit [email or web address].   Also download the MyChart app! Go to the app store, search "MyChart", open the app, select , and log in with your MyChart username and password.  Masks are optional in the cancer centers. If you would like for your care team to wear a mask while they are taking care of you, please let them know. You may have one support person who is at least 61 years old accompany you for your appointments. 

## 2022-09-02 ENCOUNTER — Other Ambulatory Visit: Payer: Self-pay

## 2022-09-02 ENCOUNTER — Inpatient Hospital Stay: Payer: Medicare HMO

## 2022-09-02 VITALS — BP 98/60 | HR 71 | Temp 97.6°F | Resp 18

## 2022-09-02 DIAGNOSIS — C3432 Malignant neoplasm of lower lobe, left bronchus or lung: Secondary | ICD-10-CM | POA: Diagnosis not present

## 2022-09-02 DIAGNOSIS — C3490 Malignant neoplasm of unspecified part of unspecified bronchus or lung: Secondary | ICD-10-CM

## 2022-09-02 DIAGNOSIS — C7972 Secondary malignant neoplasm of left adrenal gland: Secondary | ICD-10-CM | POA: Diagnosis not present

## 2022-09-02 DIAGNOSIS — C7889 Secondary malignant neoplasm of other digestive organs: Secondary | ICD-10-CM | POA: Diagnosis not present

## 2022-09-02 DIAGNOSIS — C7971 Secondary malignant neoplasm of right adrenal gland: Secondary | ICD-10-CM | POA: Diagnosis not present

## 2022-09-02 DIAGNOSIS — Z510 Encounter for antineoplastic radiation therapy: Secondary | ICD-10-CM | POA: Diagnosis not present

## 2022-09-02 DIAGNOSIS — N179 Acute kidney failure, unspecified: Secondary | ICD-10-CM | POA: Diagnosis not present

## 2022-09-02 DIAGNOSIS — Z5111 Encounter for antineoplastic chemotherapy: Secondary | ICD-10-CM | POA: Diagnosis not present

## 2022-09-02 DIAGNOSIS — C7931 Secondary malignant neoplasm of brain: Secondary | ICD-10-CM | POA: Diagnosis not present

## 2022-09-02 DIAGNOSIS — C779 Secondary and unspecified malignant neoplasm of lymph node, unspecified: Secondary | ICD-10-CM | POA: Diagnosis not present

## 2022-09-02 MED ORDER — HEPARIN SOD (PORK) LOCK FLUSH 100 UNIT/ML IV SOLN
500.0000 [IU] | Freq: Once | INTRAVENOUS | Status: AC | PRN
  Administered 2022-09-02: 500 [IU]

## 2022-09-02 MED ORDER — SODIUM CHLORIDE 0.9 % IV SOLN
10.0000 mg | Freq: Once | INTRAVENOUS | Status: AC
  Administered 2022-09-02: 10 mg via INTRAVENOUS
  Filled 2022-09-02: qty 10

## 2022-09-02 MED ORDER — TRILACICLIB DIHYDROCHLORIDE INJECTION 300 MG
240.0000 mg/m2 | Freq: Once | INTRAVENOUS | Status: AC
  Administered 2022-09-02: 480 mg via INTRAVENOUS
  Filled 2022-09-02: qty 32

## 2022-09-02 MED ORDER — SODIUM CHLORIDE 0.9 % IV SOLN: Freq: Every day | INTRAVENOUS | Status: DC

## 2022-09-02 MED ORDER — SODIUM CHLORIDE 0.9% FLUSH
10.0000 mL | INTRAVENOUS | Status: DC | PRN
  Administered 2022-09-02: 10 mL

## 2022-09-02 MED ORDER — SODIUM CHLORIDE 0.9 % IV SOLN: Freq: Once | INTRAVENOUS | Status: AC

## 2022-09-02 MED ORDER — SODIUM CHLORIDE 0.9 % IV SOLN
90.0000 mg/m2 | Freq: Once | INTRAVENOUS | Status: AC
  Administered 2022-09-02: 180 mg via INTRAVENOUS
  Filled 2022-09-02: qty 9

## 2022-09-02 NOTE — Progress Notes (Signed)
Pt d/c @ 1531.

## 2022-09-06 LAB — ACID FAST CULTURE WITH REFLEXED SENSITIVITIES (MYCOBACTERIA): Acid Fast Culture: NEGATIVE

## 2022-09-07 ENCOUNTER — Inpatient Hospital Stay: Payer: Medicare HMO | Admitting: Licensed Clinical Social Worker

## 2022-09-07 ENCOUNTER — Encounter: Payer: Medicare HMO | Admitting: Nurse Practitioner

## 2022-09-07 ENCOUNTER — Other Ambulatory Visit: Payer: Self-pay

## 2022-09-07 ENCOUNTER — Telehealth: Payer: Self-pay | Admitting: *Deleted

## 2022-09-07 DIAGNOSIS — C3490 Malignant neoplasm of unspecified part of unspecified bronchus or lung: Secondary | ICD-10-CM

## 2022-09-07 NOTE — Telephone Encounter (Signed)
CALLED PATIENT'S SISTER- JANET MCGUIRE TO INFORM OF MRI FOR 09-14-22- ARRIVAL TIME- 6:30 PM , PATIENT TO CHECK IN @ ED, NO RESTRICTIONS TO TEST, SCAN TO BE @ [facility], LVM FOR A RETURN CALL

## 2022-09-07 NOTE — Progress Notes (Signed)
[facility] CSW Progress Note  Visual merchandiser  received a call from pt's sister.  Per sister she did not hear from transportation to set up rides to and from oncology appointments.  CSW sent a message to transportation reminding of request.  Email received from transportation apologizing for the over site, stating they will contact pt today.  Pt's sister also states she dropped of the requested income verification for pt to apply for the [employer]; however, she has yet to hear back from anyone.  CSW sent a message to Orbie Hurst who will leave grant application to be signed at pt's appointment tomorrow and will follow up from there.  CSW to remain available to provide support as appropriate throughout duration of treatment.        Rachel Moulds, LCSW

## 2022-09-07 NOTE — Telephone Encounter (Signed)
CORRECTION SCAN TO BE ON 09-10-22 @ [facility], LVM FOR A RETURN CALL

## 2022-09-08 ENCOUNTER — Other Ambulatory Visit: Payer: Self-pay

## 2022-09-08 ENCOUNTER — Encounter: Payer: Self-pay | Admitting: Internal Medicine

## 2022-09-08 ENCOUNTER — Inpatient Hospital Stay: Payer: Medicare HMO

## 2022-09-08 DIAGNOSIS — C7971 Secondary malignant neoplasm of right adrenal gland: Secondary | ICD-10-CM | POA: Diagnosis not present

## 2022-09-08 DIAGNOSIS — C7972 Secondary malignant neoplasm of left adrenal gland: Secondary | ICD-10-CM | POA: Diagnosis not present

## 2022-09-08 DIAGNOSIS — Z5111 Encounter for antineoplastic chemotherapy: Secondary | ICD-10-CM | POA: Diagnosis not present

## 2022-09-08 DIAGNOSIS — Z95828 Presence of other vascular implants and grafts: Secondary | ICD-10-CM

## 2022-09-08 DIAGNOSIS — C3432 Malignant neoplasm of lower lobe, left bronchus or lung: Secondary | ICD-10-CM | POA: Diagnosis not present

## 2022-09-08 DIAGNOSIS — Z510 Encounter for antineoplastic radiation therapy: Secondary | ICD-10-CM | POA: Diagnosis not present

## 2022-09-08 DIAGNOSIS — C3490 Malignant neoplasm of unspecified part of unspecified bronchus or lung: Secondary | ICD-10-CM

## 2022-09-08 DIAGNOSIS — N179 Acute kidney failure, unspecified: Secondary | ICD-10-CM | POA: Diagnosis not present

## 2022-09-08 DIAGNOSIS — C7889 Secondary malignant neoplasm of other digestive organs: Secondary | ICD-10-CM | POA: Diagnosis not present

## 2022-09-08 DIAGNOSIS — C7931 Secondary malignant neoplasm of brain: Secondary | ICD-10-CM | POA: Diagnosis not present

## 2022-09-08 DIAGNOSIS — C779 Secondary and unspecified malignant neoplasm of lymph node, unspecified: Secondary | ICD-10-CM | POA: Diagnosis not present

## 2022-09-08 LAB — CMP (CANCER CENTER ONLY)
ALT: 20 U/L (ref 0–44)
AST: 19 U/L (ref 15–41)
Albumin: 3.4 g/dL — ABNORMAL LOW (ref 3.5–5.0)
Alkaline Phosphatase: 59 U/L (ref 38–126)
Anion gap: 9 (ref 5–15)
BUN: 28 mg/dL — ABNORMAL HIGH (ref 6–20)
CO2: 28 mmol/L (ref 22–32)
Calcium: 8 mg/dL — ABNORMAL LOW (ref 8.9–10.3)
Chloride: 99 mmol/L (ref 98–111)
Creatinine: 1.27 mg/dL — ABNORMAL HIGH (ref 0.61–1.24)
GFR, Estimated: >60 mL/min (ref >60)
Glucose, Bld: 91 mg/dL (ref 70–99)
Potassium: 4.2 mmol/L (ref 3.5–5.1)
Sodium: 136 mmol/L (ref 135–145)
Total Bilirubin: 0.3 mg/dL (ref 0.3–1.2)
Total Protein: 7.6 g/dL (ref 6.5–8.1)

## 2022-09-08 LAB — CBC WITH DIFFERENTIAL (CANCER CENTER ONLY)
Abs Immature Granulocytes: 0.1 10*3/uL — ABNORMAL HIGH (ref 0.00–0.07)
Basophils Absolute: 0.1 10*3/uL (ref 0.0–0.1)
Basophils Relative: 1 %
Eosinophils Absolute: 0.1 10*3/uL (ref 0.0–0.5)
Eosinophils Relative: 2 %
HCT: 30.4 % — ABNORMAL LOW (ref 39.0–52.0)
Hemoglobin: 10.6 g/dL — ABNORMAL LOW (ref 13.0–17.0)
Immature Granulocytes: 2 %
Lymphocytes Relative: 15 %
Lymphs Abs: 0.9 10*3/uL (ref 0.7–4.0)
MCH: 29.7 pg (ref 26.0–34.0)
MCHC: 34.9 g/dL (ref 30.0–36.0)
MCV: 85.2 fL (ref 80.0–100.0)
Monocytes Absolute: 0.2 10*3/uL (ref 0.1–1.0)
Monocytes Relative: 4 %
Neutro Abs: 5 10*3/uL (ref 1.7–7.7)
Neutrophils Relative %: 76 %
Platelet Count: 185 10*3/uL (ref 150–400)
RBC: 3.57 MIL/uL — ABNORMAL LOW (ref 4.22–5.81)
RDW: 12.2 % (ref 11.5–15.5)
WBC Count: 6.4 10*3/uL (ref 4.0–10.5)
nRBC: 0 % (ref 0.0–0.2)

## 2022-09-08 MED ORDER — SODIUM CHLORIDE 0.9% FLUSH
10.0000 mL | INTRAVENOUS | Status: AC | PRN
  Administered 2022-09-08: 10 mL

## 2022-09-08 MED ORDER — HEPARIN SOD (PORK) LOCK FLUSH 100 UNIT/ML IV SOLN
500.0000 [IU] | INTRAVENOUS | Status: AC | PRN
  Administered 2022-09-08: 500 [IU]

## 2022-09-08 NOTE — Progress Notes (Signed)
He received a gift card today from the grant.

## 2022-09-08 NOTE — Progress Notes (Signed)
Patient signed [employer] grant paperwork at registration today.  Patient approved for one-time $1000 [employer] grant to assist with personal expenses while going through treatment. A copy of approval letter and expense sheet were given and accompanying adult was advised to give me a call to discuss expenses and how the grant works.  They have my card to do so and for any additional financial questions or concerns.

## 2022-09-10 ENCOUNTER — Ambulatory Visit (HOSPITAL_COMMUNITY)
Admission: RE | Admit: 2022-09-10 | Discharge: 2022-09-10 | Disposition: A | Discharge status: Home / Self Care | Payer: Medicare HMO | Source: Ambulatory Visit | Attending: Radiation Oncology | Admitting: Radiation Oncology

## 2022-09-10 DIAGNOSIS — G9389 Other specified disorders of brain: Secondary | ICD-10-CM | POA: Diagnosis not present

## 2022-09-10 DIAGNOSIS — F17210 Nicotine dependence, cigarettes, uncomplicated: Secondary | ICD-10-CM | POA: Diagnosis not present

## 2022-09-10 DIAGNOSIS — C7931 Secondary malignant neoplasm of brain: Secondary | ICD-10-CM | POA: Diagnosis not present

## 2022-09-10 DIAGNOSIS — C3490 Malignant neoplasm of unspecified part of unspecified bronchus or lung: Secondary | ICD-10-CM | POA: Insufficient documentation

## 2022-09-10 DIAGNOSIS — C3432 Malignant neoplasm of lower lobe, left bronchus or lung: Secondary | ICD-10-CM | POA: Diagnosis not present

## 2022-09-10 MED ORDER — GADOBUTROL 1 MMOL/ML IV SOLN
7.5000 mL | Freq: Once | INTRAVENOUS | Status: AC | PRN
  Administered 2022-09-10: 7.5 mL via INTRAVENOUS

## 2022-09-10 MED ORDER — HEPARIN SOD (PORK) LOCK FLUSH 100 UNIT/ML IV SOLN
INTRAVENOUS | Status: AC
  Filled 2022-09-10: qty 5

## 2022-09-10 NOTE — Progress Notes (Signed)
Pt is a difficult stick. IV team order placed to access port. MRI exam delayed start by approx. 1 hour 15 min.

## 2022-09-11 ENCOUNTER — Ambulatory Visit
Admission: RE | Admit: 2022-09-11 | Discharge: 2022-09-11 | Disposition: A | Discharge status: Home / Self Care | Payer: Medicare HMO | Source: Ambulatory Visit | Attending: Radiation Oncology | Admitting: Radiation Oncology

## 2022-09-11 ENCOUNTER — Other Ambulatory Visit: Payer: Self-pay

## 2022-09-11 DIAGNOSIS — C7971 Secondary malignant neoplasm of right adrenal gland: Secondary | ICD-10-CM | POA: Diagnosis not present

## 2022-09-11 DIAGNOSIS — C779 Secondary and unspecified malignant neoplasm of lymph node, unspecified: Secondary | ICD-10-CM | POA: Diagnosis not present

## 2022-09-11 DIAGNOSIS — C7931 Secondary malignant neoplasm of brain: Secondary | ICD-10-CM | POA: Diagnosis not present

## 2022-09-11 DIAGNOSIS — N179 Acute kidney failure, unspecified: Secondary | ICD-10-CM | POA: Diagnosis not present

## 2022-09-11 DIAGNOSIS — Z5111 Encounter for antineoplastic chemotherapy: Secondary | ICD-10-CM | POA: Diagnosis not present

## 2022-09-11 DIAGNOSIS — C3432 Malignant neoplasm of lower lobe, left bronchus or lung: Secondary | ICD-10-CM | POA: Diagnosis not present

## 2022-09-11 DIAGNOSIS — F17210 Nicotine dependence, cigarettes, uncomplicated: Secondary | ICD-10-CM | POA: Diagnosis not present

## 2022-09-11 DIAGNOSIS — C7972 Secondary malignant neoplasm of left adrenal gland: Secondary | ICD-10-CM | POA: Diagnosis not present

## 2022-09-11 DIAGNOSIS — C7889 Secondary malignant neoplasm of other digestive organs: Secondary | ICD-10-CM | POA: Diagnosis not present

## 2022-09-11 DIAGNOSIS — Z510 Encounter for antineoplastic radiation therapy: Secondary | ICD-10-CM | POA: Diagnosis not present

## 2022-09-14 ENCOUNTER — Telehealth: Payer: Self-pay | Admitting: Radiation Oncology

## 2022-09-14 ENCOUNTER — Other Ambulatory Visit: Payer: Self-pay | Admitting: Physician Assistant

## 2022-09-14 DIAGNOSIS — C3490 Malignant neoplasm of unspecified part of unspecified bronchus or lung: Secondary | ICD-10-CM

## 2022-09-14 NOTE — Telephone Encounter (Signed)
I called and left a voicemail for the patient's sister [name] to review his MRI and plans for treatment to begin next week. He was able to come in and simulate for his radiation already last week.

## 2022-09-15 ENCOUNTER — Other Ambulatory Visit: Payer: Self-pay

## 2022-09-15 ENCOUNTER — Inpatient Hospital Stay: Payer: Medicare HMO

## 2022-09-15 DIAGNOSIS — C779 Secondary and unspecified malignant neoplasm of lymph node, unspecified: Secondary | ICD-10-CM | POA: Diagnosis not present

## 2022-09-15 DIAGNOSIS — C7931 Secondary malignant neoplasm of brain: Secondary | ICD-10-CM | POA: Diagnosis not present

## 2022-09-15 DIAGNOSIS — F17210 Nicotine dependence, cigarettes, uncomplicated: Secondary | ICD-10-CM | POA: Diagnosis not present

## 2022-09-15 DIAGNOSIS — Z510 Encounter for antineoplastic radiation therapy: Secondary | ICD-10-CM | POA: Diagnosis not present

## 2022-09-15 DIAGNOSIS — Z95828 Presence of other vascular implants and grafts: Secondary | ICD-10-CM

## 2022-09-15 DIAGNOSIS — N179 Acute kidney failure, unspecified: Secondary | ICD-10-CM | POA: Diagnosis not present

## 2022-09-15 DIAGNOSIS — C3490 Malignant neoplasm of unspecified part of unspecified bronchus or lung: Secondary | ICD-10-CM

## 2022-09-15 DIAGNOSIS — C7889 Secondary malignant neoplasm of other digestive organs: Secondary | ICD-10-CM | POA: Diagnosis not present

## 2022-09-15 DIAGNOSIS — C7971 Secondary malignant neoplasm of right adrenal gland: Secondary | ICD-10-CM | POA: Diagnosis not present

## 2022-09-15 DIAGNOSIS — C7972 Secondary malignant neoplasm of left adrenal gland: Secondary | ICD-10-CM | POA: Diagnosis not present

## 2022-09-15 DIAGNOSIS — Z5111 Encounter for antineoplastic chemotherapy: Secondary | ICD-10-CM | POA: Diagnosis not present

## 2022-09-15 DIAGNOSIS — C3432 Malignant neoplasm of lower lobe, left bronchus or lung: Secondary | ICD-10-CM | POA: Diagnosis not present

## 2022-09-15 LAB — CBC WITH DIFFERENTIAL (CANCER CENTER ONLY)
Abs Immature Granulocytes: 0.03 10*3/uL (ref 0.00–0.07)
Basophils Absolute: 0.1 10*3/uL (ref 0.0–0.1)
Basophils Relative: 1 %
Eosinophils Absolute: 0.4 10*3/uL (ref 0.0–0.5)
Eosinophils Relative: 7 %
HCT: 27.4 % — ABNORMAL LOW (ref 39.0–52.0)
Hemoglobin: 9.7 g/dL — ABNORMAL LOW (ref 13.0–17.0)
Immature Granulocytes: 1 %
Lymphocytes Relative: 21 %
Lymphs Abs: 1.2 10*3/uL (ref 0.7–4.0)
MCH: 30.4 pg (ref 26.0–34.0)
MCHC: 35.4 g/dL (ref 30.0–36.0)
MCV: 85.9 fL (ref 80.0–100.0)
Monocytes Absolute: 0.9 10*3/uL (ref 0.1–1.0)
Monocytes Relative: 16 %
Neutro Abs: 3 10*3/uL (ref 1.7–7.7)
Neutrophils Relative %: 54 %
Platelet Count: 216 10*3/uL (ref 150–400)
RBC: 3.19 MIL/uL — ABNORMAL LOW (ref 4.22–5.81)
RDW: 13.3 % (ref 11.5–15.5)
WBC Count: 5.6 10*3/uL (ref 4.0–10.5)
nRBC: 0 % (ref 0.0–0.2)

## 2022-09-15 LAB — CMP (CANCER CENTER ONLY)
ALT: 25 U/L (ref 0–44)
AST: 21 U/L (ref 15–41)
Albumin: 3.5 g/dL (ref 3.5–5.0)
Alkaline Phosphatase: 64 U/L (ref 38–126)
Anion gap: 8 (ref 5–15)
BUN: 14 mg/dL (ref 6–20)
CO2: 28 mmol/L (ref 22–32)
Calcium: 8 mg/dL — ABNORMAL LOW (ref 8.9–10.3)
Chloride: 101 mmol/L (ref 98–111)
Creatinine: 1.11 mg/dL (ref 0.61–1.24)
GFR, Estimated: >60 mL/min (ref >60)
Glucose, Bld: 83 mg/dL (ref 70–99)
Potassium: 3.7 mmol/L (ref 3.5–5.1)
Sodium: 137 mmol/L (ref 135–145)
Total Bilirubin: 0.3 mg/dL (ref 0.3–1.2)
Total Protein: 7.3 g/dL (ref 6.5–8.1)

## 2022-09-15 MED ORDER — HEPARIN SOD (PORK) LOCK FLUSH 100 UNIT/ML IV SOLN
500.0000 [IU] | INTRAVENOUS | Status: AC | PRN
  Administered 2022-09-15: 500 [IU]

## 2022-09-15 MED ORDER — HEPARIN SOD (PORK) LOCK FLUSH 100 UNIT/ML IV SOLN: 500.0000 [IU] | INTRAVENOUS | Status: DC | PRN

## 2022-09-15 MED ORDER — SODIUM CHLORIDE 0.9% FLUSH
10.0000 mL | INTRAVENOUS | Status: AC | PRN
  Administered 2022-09-15: 10 mL

## 2022-09-16 ENCOUNTER — Inpatient Hospital Stay: Payer: Medicare HMO

## 2022-09-16 ENCOUNTER — Encounter (HOSPITAL_COMMUNITY): Payer: Self-pay

## 2022-09-16 ENCOUNTER — Encounter: Payer: Self-pay | Admitting: Internal Medicine

## 2022-09-16 ENCOUNTER — Ambulatory Visit (HOSPITAL_COMMUNITY)
Admission: RE | Admit: 2022-09-16 | Discharge: 2022-09-16 | Disposition: A | Discharge status: Home / Self Care | Payer: Medicare HMO | Source: Ambulatory Visit | Attending: Physician Assistant | Admitting: Physician Assistant

## 2022-09-16 DIAGNOSIS — N179 Acute kidney failure, unspecified: Secondary | ICD-10-CM | POA: Diagnosis not present

## 2022-09-16 DIAGNOSIS — C779 Secondary and unspecified malignant neoplasm of lymph node, unspecified: Secondary | ICD-10-CM | POA: Diagnosis not present

## 2022-09-16 DIAGNOSIS — C3490 Malignant neoplasm of unspecified part of unspecified bronchus or lung: Secondary | ICD-10-CM | POA: Insufficient documentation

## 2022-09-16 DIAGNOSIS — C3432 Malignant neoplasm of lower lobe, left bronchus or lung: Secondary | ICD-10-CM | POA: Diagnosis not present

## 2022-09-16 DIAGNOSIS — C7970 Secondary malignant neoplasm of unspecified adrenal gland: Secondary | ICD-10-CM | POA: Diagnosis not present

## 2022-09-16 DIAGNOSIS — C7931 Secondary malignant neoplasm of brain: Secondary | ICD-10-CM | POA: Diagnosis not present

## 2022-09-16 DIAGNOSIS — Z95828 Presence of other vascular implants and grafts: Secondary | ICD-10-CM | POA: Insufficient documentation

## 2022-09-16 DIAGNOSIS — Z5111 Encounter for antineoplastic chemotherapy: Secondary | ICD-10-CM | POA: Diagnosis not present

## 2022-09-16 DIAGNOSIS — C7889 Secondary malignant neoplasm of other digestive organs: Secondary | ICD-10-CM | POA: Diagnosis not present

## 2022-09-16 DIAGNOSIS — Z510 Encounter for antineoplastic radiation therapy: Secondary | ICD-10-CM | POA: Diagnosis not present

## 2022-09-16 DIAGNOSIS — C7971 Secondary malignant neoplasm of right adrenal gland: Secondary | ICD-10-CM | POA: Diagnosis not present

## 2022-09-16 DIAGNOSIS — C7972 Secondary malignant neoplasm of left adrenal gland: Secondary | ICD-10-CM | POA: Diagnosis not present

## 2022-09-16 DIAGNOSIS — J439 Emphysema, unspecified: Secondary | ICD-10-CM | POA: Diagnosis not present

## 2022-09-16 MED ORDER — SODIUM CHLORIDE 0.9% FLUSH
10.0000 mL | Freq: Once | INTRAVENOUS | Status: AC
  Administered 2022-09-16: 10 mL

## 2022-09-16 MED ORDER — HEPARIN SOD (PORK) LOCK FLUSH 100 UNIT/ML IV SOLN
INTRAVENOUS | Status: AC
  Filled 2022-09-16: qty 5

## 2022-09-16 MED ORDER — HEPARIN SOD (PORK) LOCK FLUSH 100 UNIT/ML IV SOLN
500.0000 [IU] | Freq: Once | INTRAVENOUS | Status: AC
  Administered 2022-09-16: 500 [IU] via INTRAVENOUS

## 2022-09-19 ENCOUNTER — Other Ambulatory Visit: Payer: Self-pay | Admitting: Nurse Practitioner

## 2022-09-19 DIAGNOSIS — I10 Essential (primary) hypertension: Secondary | ICD-10-CM

## 2022-09-21 ENCOUNTER — Ambulatory Visit: Payer: Medicare HMO | Admitting: Internal Medicine

## 2022-09-21 ENCOUNTER — Other Ambulatory Visit: Payer: Self-pay

## 2022-09-21 ENCOUNTER — Ambulatory Visit: Payer: Medicare HMO

## 2022-09-21 ENCOUNTER — Inpatient Hospital Stay: Payer: Medicare HMO

## 2022-09-21 ENCOUNTER — Ambulatory Visit
Admission: RE | Admit: 2022-09-21 | Discharge: 2022-09-21 | Disposition: A | Discharge status: Home / Self Care | Payer: Medicare HMO | Source: Ambulatory Visit | Attending: Radiation Oncology | Admitting: Radiation Oncology

## 2022-09-21 ENCOUNTER — Other Ambulatory Visit: Payer: Medicare HMO

## 2022-09-21 ENCOUNTER — Ambulatory Visit: Payer: Self-pay | Admitting: Radiation Oncology

## 2022-09-21 DIAGNOSIS — C7971 Secondary malignant neoplasm of right adrenal gland: Secondary | ICD-10-CM | POA: Diagnosis not present

## 2022-09-21 DIAGNOSIS — Z5111 Encounter for antineoplastic chemotherapy: Secondary | ICD-10-CM | POA: Diagnosis not present

## 2022-09-21 DIAGNOSIS — C7931 Secondary malignant neoplasm of brain: Secondary | ICD-10-CM | POA: Diagnosis not present

## 2022-09-21 DIAGNOSIS — C7889 Secondary malignant neoplasm of other digestive organs: Secondary | ICD-10-CM | POA: Diagnosis not present

## 2022-09-21 DIAGNOSIS — N179 Acute kidney failure, unspecified: Secondary | ICD-10-CM | POA: Diagnosis not present

## 2022-09-21 DIAGNOSIS — F17210 Nicotine dependence, cigarettes, uncomplicated: Secondary | ICD-10-CM | POA: Diagnosis not present

## 2022-09-21 DIAGNOSIS — C779 Secondary and unspecified malignant neoplasm of lymph node, unspecified: Secondary | ICD-10-CM | POA: Diagnosis not present

## 2022-09-21 DIAGNOSIS — Z510 Encounter for antineoplastic radiation therapy: Secondary | ICD-10-CM | POA: Diagnosis not present

## 2022-09-21 DIAGNOSIS — C7972 Secondary malignant neoplasm of left adrenal gland: Secondary | ICD-10-CM | POA: Diagnosis not present

## 2022-09-21 DIAGNOSIS — C3432 Malignant neoplasm of lower lobe, left bronchus or lung: Secondary | ICD-10-CM | POA: Diagnosis not present

## 2022-09-21 DIAGNOSIS — C3490 Malignant neoplasm of unspecified part of unspecified bronchus or lung: Secondary | ICD-10-CM

## 2022-09-21 DIAGNOSIS — Z95828 Presence of other vascular implants and grafts: Secondary | ICD-10-CM

## 2022-09-21 LAB — CMP (CANCER CENTER ONLY)
ALT: 23 U/L (ref 0–44)
AST: 21 U/L (ref 15–41)
Albumin: 3.4 g/dL — ABNORMAL LOW (ref 3.5–5.0)
Alkaline Phosphatase: 63 U/L (ref 38–126)
Anion gap: 8 (ref 5–15)
BUN: 18 mg/dL (ref 6–20)
CO2: 28 mmol/L (ref 22–32)
Calcium: 8.5 mg/dL — ABNORMAL LOW (ref 8.9–10.3)
Chloride: 102 mmol/L (ref 98–111)
Creatinine: 1.13 mg/dL (ref 0.61–1.24)
GFR, Estimated: >60 mL/min (ref >60)
Glucose, Bld: 110 mg/dL — ABNORMAL HIGH (ref 70–99)
Potassium: 4.1 mmol/L (ref 3.5–5.1)
Sodium: 138 mmol/L (ref 135–145)
Total Bilirubin: 0.3 mg/dL (ref 0.3–1.2)
Total Protein: 7.8 g/dL (ref 6.5–8.1)

## 2022-09-21 LAB — CBC WITH DIFFERENTIAL (CANCER CENTER ONLY)
Abs Immature Granulocytes: 0.34 10*3/uL — ABNORMAL HIGH (ref 0.00–0.07)
Basophils Absolute: 0.2 10*3/uL — ABNORMAL HIGH (ref 0.0–0.1)
Basophils Relative: 2 %
Eosinophils Absolute: 0.4 10*3/uL (ref 0.0–0.5)
Eosinophils Relative: 4 %
HCT: 29.4 % — ABNORMAL LOW (ref 39.0–52.0)
Hemoglobin: 10.1 g/dL — ABNORMAL LOW (ref 13.0–17.0)
Immature Granulocytes: 4 %
Lymphocytes Relative: 20 %
Lymphs Abs: 1.7 10*3/uL (ref 0.7–4.0)
MCH: 30 pg (ref 26.0–34.0)
MCHC: 34.4 g/dL (ref 30.0–36.0)
MCV: 87.2 fL (ref 80.0–100.0)
Monocytes Absolute: 1.3 10*3/uL — ABNORMAL HIGH (ref 0.1–1.0)
Monocytes Relative: 15 %
Neutro Abs: 4.7 10*3/uL (ref 1.7–7.7)
Neutrophils Relative %: 55 %
Platelet Count: 379 10*3/uL (ref 150–400)
RBC: 3.37 MIL/uL — ABNORMAL LOW (ref 4.22–5.81)
RDW: 14.3 % (ref 11.5–15.5)
WBC Count: 8.6 10*3/uL (ref 4.0–10.5)
nRBC: 0 % (ref 0.0–0.2)

## 2022-09-21 LAB — RAD ONC ARIA SESSION SUMMARY
Course Elapsed Days: 0
Plan Fractions Treated to Date: 1
Plan Prescribed Dose Per Fraction: 3 Gy
Plan Total Fractions Prescribed: 10
Plan Total Prescribed Dose: 30 Gy
Reference Point Dosage Given to Date: 3 Gy
Reference Point Session Dosage Given: 3 Gy
Session Number: 1

## 2022-09-21 MED ORDER — HEPARIN SOD (PORK) LOCK FLUSH 100 UNIT/ML IV SOLN
500.0000 [IU] | Freq: Once | INTRAVENOUS | Status: AC
  Administered 2022-09-21: 500 [IU]

## 2022-09-21 MED ORDER — SODIUM CHLORIDE 0.9% FLUSH
10.0000 mL | Freq: Once | INTRAVENOUS | Status: AC
  Administered 2022-09-21: 10 mL

## 2022-09-22 ENCOUNTER — Other Ambulatory Visit: Payer: Self-pay

## 2022-09-22 ENCOUNTER — Ambulatory Visit: Payer: Medicare HMO

## 2022-09-22 ENCOUNTER — Ambulatory Visit
Admission: RE | Admit: 2022-09-22 | Discharge: 2022-09-22 | Disposition: A | Discharge status: Home / Self Care | Payer: Medicare HMO | Source: Ambulatory Visit | Attending: Radiation Oncology | Admitting: Radiation Oncology

## 2022-09-22 DIAGNOSIS — N179 Acute kidney failure, unspecified: Secondary | ICD-10-CM | POA: Diagnosis not present

## 2022-09-22 DIAGNOSIS — Z5111 Encounter for antineoplastic chemotherapy: Secondary | ICD-10-CM | POA: Diagnosis not present

## 2022-09-22 DIAGNOSIS — Z510 Encounter for antineoplastic radiation therapy: Secondary | ICD-10-CM | POA: Diagnosis not present

## 2022-09-22 DIAGNOSIS — C7889 Secondary malignant neoplasm of other digestive organs: Secondary | ICD-10-CM | POA: Diagnosis not present

## 2022-09-22 DIAGNOSIS — C779 Secondary and unspecified malignant neoplasm of lymph node, unspecified: Secondary | ICD-10-CM | POA: Diagnosis not present

## 2022-09-22 DIAGNOSIS — C7972 Secondary malignant neoplasm of left adrenal gland: Secondary | ICD-10-CM | POA: Diagnosis not present

## 2022-09-22 DIAGNOSIS — C3432 Malignant neoplasm of lower lobe, left bronchus or lung: Secondary | ICD-10-CM | POA: Diagnosis not present

## 2022-09-22 DIAGNOSIS — C7931 Secondary malignant neoplasm of brain: Secondary | ICD-10-CM | POA: Diagnosis not present

## 2022-09-22 DIAGNOSIS — C7971 Secondary malignant neoplasm of right adrenal gland: Secondary | ICD-10-CM | POA: Diagnosis not present

## 2022-09-22 LAB — RAD ONC ARIA SESSION SUMMARY
Course Elapsed Days: 1
Plan Fractions Treated to Date: 2
Plan Prescribed Dose Per Fraction: 3 Gy
Plan Total Fractions Prescribed: 10
Plan Total Prescribed Dose: 30 Gy
Reference Point Dosage Given to Date: 6 Gy
Reference Point Session Dosage Given: 3 Gy
Session Number: 2

## 2022-09-23 ENCOUNTER — Ambulatory Visit
Admission: RE | Admit: 2022-09-23 | Discharge: 2022-09-23 | Disposition: A | Discharge status: Home / Self Care | Payer: Medicare HMO | Source: Ambulatory Visit | Attending: Radiation Oncology | Admitting: Radiation Oncology

## 2022-09-23 ENCOUNTER — Ambulatory Visit: Payer: Medicare HMO

## 2022-09-23 ENCOUNTER — Other Ambulatory Visit: Payer: Self-pay

## 2022-09-23 DIAGNOSIS — Z510 Encounter for antineoplastic radiation therapy: Secondary | ICD-10-CM | POA: Diagnosis not present

## 2022-09-23 DIAGNOSIS — C7889 Secondary malignant neoplasm of other digestive organs: Secondary | ICD-10-CM | POA: Diagnosis not present

## 2022-09-23 DIAGNOSIS — C3432 Malignant neoplasm of lower lobe, left bronchus or lung: Secondary | ICD-10-CM | POA: Diagnosis not present

## 2022-09-23 DIAGNOSIS — C7972 Secondary malignant neoplasm of left adrenal gland: Secondary | ICD-10-CM | POA: Diagnosis not present

## 2022-09-23 DIAGNOSIS — N179 Acute kidney failure, unspecified: Secondary | ICD-10-CM | POA: Diagnosis not present

## 2022-09-23 DIAGNOSIS — C7971 Secondary malignant neoplasm of right adrenal gland: Secondary | ICD-10-CM | POA: Diagnosis not present

## 2022-09-23 DIAGNOSIS — C7931 Secondary malignant neoplasm of brain: Secondary | ICD-10-CM | POA: Diagnosis not present

## 2022-09-23 DIAGNOSIS — Z5111 Encounter for antineoplastic chemotherapy: Secondary | ICD-10-CM | POA: Diagnosis not present

## 2022-09-23 DIAGNOSIS — C779 Secondary and unspecified malignant neoplasm of lymph node, unspecified: Secondary | ICD-10-CM | POA: Diagnosis not present

## 2022-09-23 LAB — RAD ONC ARIA SESSION SUMMARY
Course Elapsed Days: 2
Plan Fractions Treated to Date: 3
Plan Prescribed Dose Per Fraction: 3 Gy
Plan Total Fractions Prescribed: 10
Plan Total Prescribed Dose: 30 Gy
Reference Point Dosage Given to Date: 9 Gy
Reference Point Session Dosage Given: 3 Gy
Session Number: 3

## 2022-09-24 ENCOUNTER — Ambulatory Visit
Admission: RE | Admit: 2022-09-24 | Discharge: 2022-09-24 | Disposition: A | Discharge status: Home / Self Care | Payer: Medicare HMO | Source: Ambulatory Visit | Attending: Radiation Oncology | Admitting: Radiation Oncology

## 2022-09-24 ENCOUNTER — Other Ambulatory Visit: Payer: Self-pay

## 2022-09-24 DIAGNOSIS — I700 Atherosclerosis of aorta: Secondary | ICD-10-CM | POA: Diagnosis not present

## 2022-09-24 DIAGNOSIS — I10 Essential (primary) hypertension: Secondary | ICD-10-CM | POA: Insufficient documentation

## 2022-09-24 DIAGNOSIS — C3432 Malignant neoplasm of lower lobe, left bronchus or lung: Secondary | ICD-10-CM | POA: Diagnosis not present

## 2022-09-24 DIAGNOSIS — C7971 Secondary malignant neoplasm of right adrenal gland: Secondary | ICD-10-CM | POA: Insufficient documentation

## 2022-09-24 DIAGNOSIS — Z510 Encounter for antineoplastic radiation therapy: Secondary | ICD-10-CM | POA: Diagnosis not present

## 2022-09-24 DIAGNOSIS — C779 Secondary and unspecified malignant neoplasm of lymph node, unspecified: Secondary | ICD-10-CM | POA: Diagnosis not present

## 2022-09-24 DIAGNOSIS — C7889 Secondary malignant neoplasm of other digestive organs: Secondary | ICD-10-CM | POA: Insufficient documentation

## 2022-09-24 DIAGNOSIS — Z5111 Encounter for antineoplastic chemotherapy: Secondary | ICD-10-CM | POA: Diagnosis not present

## 2022-09-24 DIAGNOSIS — R197 Diarrhea, unspecified: Secondary | ICD-10-CM | POA: Insufficient documentation

## 2022-09-24 DIAGNOSIS — C7931 Secondary malignant neoplasm of brain: Secondary | ICD-10-CM | POA: Insufficient documentation

## 2022-09-24 DIAGNOSIS — C7972 Secondary malignant neoplasm of left adrenal gland: Secondary | ICD-10-CM | POA: Insufficient documentation

## 2022-09-24 DIAGNOSIS — M479 Spondylosis, unspecified: Secondary | ICD-10-CM | POA: Insufficient documentation

## 2022-09-24 DIAGNOSIS — N179 Acute kidney failure, unspecified: Secondary | ICD-10-CM | POA: Diagnosis not present

## 2022-09-24 LAB — RAD ONC ARIA SESSION SUMMARY
Course Elapsed Days: 3
Plan Fractions Treated to Date: 4
Plan Prescribed Dose Per Fraction: 3 Gy
Plan Total Fractions Prescribed: 10
Plan Total Prescribed Dose: 30 Gy
Reference Point Dosage Given to Date: 12 Gy
Reference Point Session Dosage Given: 3 Gy
Session Number: 4

## 2022-09-25 ENCOUNTER — Other Ambulatory Visit: Payer: Self-pay

## 2022-09-25 ENCOUNTER — Ambulatory Visit
Admission: RE | Admit: 2022-09-25 | Discharge: 2022-09-25 | Disposition: A | Discharge status: Home / Self Care | Payer: Medicare HMO | Source: Ambulatory Visit | Attending: Radiation Oncology | Admitting: Radiation Oncology

## 2022-09-25 DIAGNOSIS — N179 Acute kidney failure, unspecified: Secondary | ICD-10-CM | POA: Diagnosis not present

## 2022-09-25 DIAGNOSIS — Z5111 Encounter for antineoplastic chemotherapy: Secondary | ICD-10-CM | POA: Diagnosis not present

## 2022-09-25 DIAGNOSIS — C3432 Malignant neoplasm of lower lobe, left bronchus or lung: Secondary | ICD-10-CM | POA: Diagnosis not present

## 2022-09-25 DIAGNOSIS — C779 Secondary and unspecified malignant neoplasm of lymph node, unspecified: Secondary | ICD-10-CM | POA: Diagnosis not present

## 2022-09-25 DIAGNOSIS — C7931 Secondary malignant neoplasm of brain: Secondary | ICD-10-CM | POA: Diagnosis not present

## 2022-09-25 DIAGNOSIS — Z510 Encounter for antineoplastic radiation therapy: Secondary | ICD-10-CM | POA: Diagnosis not present

## 2022-09-25 DIAGNOSIS — C7971 Secondary malignant neoplasm of right adrenal gland: Secondary | ICD-10-CM | POA: Diagnosis not present

## 2022-09-25 DIAGNOSIS — C7889 Secondary malignant neoplasm of other digestive organs: Secondary | ICD-10-CM | POA: Diagnosis not present

## 2022-09-25 DIAGNOSIS — C7972 Secondary malignant neoplasm of left adrenal gland: Secondary | ICD-10-CM | POA: Diagnosis not present

## 2022-09-25 LAB — RAD ONC ARIA SESSION SUMMARY
Course Elapsed Days: 4
Plan Fractions Treated to Date: 5
Plan Prescribed Dose Per Fraction: 3 Gy
Plan Total Fractions Prescribed: 10
Plan Total Prescribed Dose: 30 Gy
Reference Point Dosage Given to Date: 15 Gy
Reference Point Session Dosage Given: 3 Gy
Session Number: 5

## 2022-09-28 ENCOUNTER — Other Ambulatory Visit: Payer: Self-pay

## 2022-09-28 ENCOUNTER — Ambulatory Visit
Admission: RE | Admit: 2022-09-28 | Discharge: 2022-09-28 | Disposition: A | Discharge status: Home / Self Care | Payer: Medicare HMO | Source: Ambulatory Visit | Attending: Radiation Oncology | Admitting: Radiation Oncology

## 2022-09-28 DIAGNOSIS — Z5111 Encounter for antineoplastic chemotherapy: Secondary | ICD-10-CM | POA: Diagnosis not present

## 2022-09-28 DIAGNOSIS — F17210 Nicotine dependence, cigarettes, uncomplicated: Secondary | ICD-10-CM | POA: Diagnosis not present

## 2022-09-28 DIAGNOSIS — C7972 Secondary malignant neoplasm of left adrenal gland: Secondary | ICD-10-CM | POA: Diagnosis not present

## 2022-09-28 DIAGNOSIS — N179 Acute kidney failure, unspecified: Secondary | ICD-10-CM | POA: Diagnosis not present

## 2022-09-28 DIAGNOSIS — Z510 Encounter for antineoplastic radiation therapy: Secondary | ICD-10-CM | POA: Diagnosis not present

## 2022-09-28 DIAGNOSIS — C7931 Secondary malignant neoplasm of brain: Secondary | ICD-10-CM | POA: Diagnosis not present

## 2022-09-28 DIAGNOSIS — C3432 Malignant neoplasm of lower lobe, left bronchus or lung: Secondary | ICD-10-CM | POA: Diagnosis not present

## 2022-09-28 DIAGNOSIS — C7971 Secondary malignant neoplasm of right adrenal gland: Secondary | ICD-10-CM | POA: Diagnosis not present

## 2022-09-28 DIAGNOSIS — C779 Secondary and unspecified malignant neoplasm of lymph node, unspecified: Secondary | ICD-10-CM | POA: Diagnosis not present

## 2022-09-28 DIAGNOSIS — C7889 Secondary malignant neoplasm of other digestive organs: Secondary | ICD-10-CM | POA: Diagnosis not present

## 2022-09-28 LAB — RAD ONC ARIA SESSION SUMMARY
Course Elapsed Days: 7
Plan Fractions Treated to Date: 6
Plan Prescribed Dose Per Fraction: 3 Gy
Plan Total Fractions Prescribed: 10
Plan Total Prescribed Dose: 30 Gy
Reference Point Dosage Given to Date: 18 Gy
Reference Point Session Dosage Given: 3 Gy
Session Number: 6

## 2022-09-29 ENCOUNTER — Inpatient Hospital Stay: Payer: Medicare HMO

## 2022-09-29 ENCOUNTER — Other Ambulatory Visit: Payer: Self-pay

## 2022-09-29 ENCOUNTER — Inpatient Hospital Stay: Payer: Medicare HMO | Attending: Physician Assistant

## 2022-09-29 ENCOUNTER — Ambulatory Visit
Admission: RE | Admit: 2022-09-29 | Discharge: 2022-09-29 | Disposition: A | Discharge status: Home / Self Care | Payer: Medicare HMO | Source: Ambulatory Visit | Attending: Radiation Oncology | Admitting: Radiation Oncology

## 2022-09-29 VITALS — BP 104/69 | HR 74 | Temp 98.0°F | Resp 20

## 2022-09-29 DIAGNOSIS — Z5111 Encounter for antineoplastic chemotherapy: Secondary | ICD-10-CM | POA: Insufficient documentation

## 2022-09-29 DIAGNOSIS — Z510 Encounter for antineoplastic radiation therapy: Secondary | ICD-10-CM | POA: Diagnosis not present

## 2022-09-29 DIAGNOSIS — Z95828 Presence of other vascular implants and grafts: Secondary | ICD-10-CM

## 2022-09-29 DIAGNOSIS — I10 Essential (primary) hypertension: Secondary | ICD-10-CM | POA: Insufficient documentation

## 2022-09-29 DIAGNOSIS — C3432 Malignant neoplasm of lower lobe, left bronchus or lung: Secondary | ICD-10-CM | POA: Insufficient documentation

## 2022-09-29 DIAGNOSIS — Z79899 Other long term (current) drug therapy: Secondary | ICD-10-CM | POA: Insufficient documentation

## 2022-09-29 DIAGNOSIS — C7972 Secondary malignant neoplasm of left adrenal gland: Secondary | ICD-10-CM | POA: Insufficient documentation

## 2022-09-29 DIAGNOSIS — N179 Acute kidney failure, unspecified: Secondary | ICD-10-CM | POA: Diagnosis not present

## 2022-09-29 DIAGNOSIS — C779 Secondary and unspecified malignant neoplasm of lymph node, unspecified: Secondary | ICD-10-CM | POA: Diagnosis not present

## 2022-09-29 DIAGNOSIS — C7971 Secondary malignant neoplasm of right adrenal gland: Secondary | ICD-10-CM | POA: Diagnosis not present

## 2022-09-29 DIAGNOSIS — C7889 Secondary malignant neoplasm of other digestive organs: Secondary | ICD-10-CM | POA: Diagnosis not present

## 2022-09-29 DIAGNOSIS — C7931 Secondary malignant neoplasm of brain: Secondary | ICD-10-CM | POA: Diagnosis not present

## 2022-09-29 DIAGNOSIS — R634 Abnormal weight loss: Secondary | ICD-10-CM | POA: Insufficient documentation

## 2022-09-29 DIAGNOSIS — Z5112 Encounter for antineoplastic immunotherapy: Secondary | ICD-10-CM | POA: Insufficient documentation

## 2022-09-29 DIAGNOSIS — C3490 Malignant neoplasm of unspecified part of unspecified bronchus or lung: Secondary | ICD-10-CM

## 2022-09-29 LAB — CBC WITH DIFFERENTIAL (CANCER CENTER ONLY)
Abs Immature Granulocytes: 0.11 10*3/uL — ABNORMAL HIGH (ref 0.00–0.07)
Basophils Absolute: 0.1 10*3/uL (ref 0.0–0.1)
Basophils Relative: 1 %
Eosinophils Absolute: 0.2 10*3/uL (ref 0.0–0.5)
Eosinophils Relative: 2 %
HCT: 30.8 % — ABNORMAL LOW (ref 39.0–52.0)
Hemoglobin: 10.6 g/dL — ABNORMAL LOW (ref 13.0–17.0)
Immature Granulocytes: 1 %
Lymphocytes Relative: 12 %
Lymphs Abs: 1.4 10*3/uL (ref 0.7–4.0)
MCH: 30.3 pg (ref 26.0–34.0)
MCHC: 34.4 g/dL (ref 30.0–36.0)
MCV: 88 fL (ref 80.0–100.0)
Monocytes Absolute: 1.2 10*3/uL — ABNORMAL HIGH (ref 0.1–1.0)
Monocytes Relative: 10 %
Neutro Abs: 8.3 10*3/uL — ABNORMAL HIGH (ref 1.7–7.7)
Neutrophils Relative %: 74 %
Platelet Count: 254 10*3/uL (ref 150–400)
RBC: 3.5 MIL/uL — ABNORMAL LOW (ref 4.22–5.81)
RDW: 14.5 % (ref 11.5–15.5)
WBC Count: 11.3 10*3/uL — ABNORMAL HIGH (ref 4.0–10.5)
nRBC: 0 % (ref 0.0–0.2)

## 2022-09-29 LAB — RAD ONC ARIA SESSION SUMMARY
Course Elapsed Days: 8
Plan Fractions Treated to Date: 7
Plan Prescribed Dose Per Fraction: 3 Gy
Plan Total Fractions Prescribed: 10
Plan Total Prescribed Dose: 30 Gy
Reference Point Dosage Given to Date: 21 Gy
Reference Point Session Dosage Given: 3 Gy
Session Number: 7

## 2022-09-29 LAB — CMP (CANCER CENTER ONLY)
ALT: 19 U/L (ref 0–44)
AST: 21 U/L (ref 15–41)
Albumin: 3.6 g/dL (ref 3.5–5.0)
Alkaline Phosphatase: 67 U/L (ref 38–126)
Anion gap: 9 (ref 5–15)
BUN: 15 mg/dL (ref 6–20)
CO2: 28 mmol/L (ref 22–32)
Calcium: 8.4 mg/dL — ABNORMAL LOW (ref 8.9–10.3)
Chloride: 102 mmol/L (ref 98–111)
Creatinine: 1.09 mg/dL (ref 0.61–1.24)
GFR, Estimated: >60 mL/min (ref >60)
Glucose, Bld: 93 mg/dL (ref 70–99)
Potassium: 4.1 mmol/L (ref 3.5–5.1)
Sodium: 139 mmol/L (ref 135–145)
Total Bilirubin: 0.4 mg/dL (ref 0.3–1.2)
Total Protein: 7.3 g/dL (ref 6.5–8.1)

## 2022-09-29 MED ORDER — SODIUM CHLORIDE 0.9% FLUSH
10.0000 mL | Freq: Once | INTRAVENOUS | Status: AC
  Administered 2022-09-29: 10 mL

## 2022-09-29 MED ORDER — HEPARIN SOD (PORK) LOCK FLUSH 100 UNIT/ML IV SOLN
500.0000 [IU] | Freq: Once | INTRAVENOUS | Status: AC
  Administered 2022-09-29: 500 [IU]

## 2022-09-30 ENCOUNTER — Other Ambulatory Visit: Payer: Self-pay

## 2022-09-30 ENCOUNTER — Inpatient Hospital Stay: Payer: Medicare HMO

## 2022-09-30 ENCOUNTER — Ambulatory Visit
Admission: RE | Admit: 2022-09-30 | Discharge: 2022-09-30 | Disposition: A | Discharge status: Home / Self Care | Payer: Medicare HMO | Source: Ambulatory Visit | Attending: Radiation Oncology | Admitting: Radiation Oncology

## 2022-09-30 DIAGNOSIS — C3432 Malignant neoplasm of lower lobe, left bronchus or lung: Secondary | ICD-10-CM | POA: Diagnosis not present

## 2022-09-30 DIAGNOSIS — Z5111 Encounter for antineoplastic chemotherapy: Secondary | ICD-10-CM | POA: Diagnosis not present

## 2022-09-30 DIAGNOSIS — C7971 Secondary malignant neoplasm of right adrenal gland: Secondary | ICD-10-CM | POA: Diagnosis not present

## 2022-09-30 DIAGNOSIS — Z510 Encounter for antineoplastic radiation therapy: Secondary | ICD-10-CM | POA: Diagnosis not present

## 2022-09-30 DIAGNOSIS — C779 Secondary and unspecified malignant neoplasm of lymph node, unspecified: Secondary | ICD-10-CM | POA: Diagnosis not present

## 2022-09-30 DIAGNOSIS — C7931 Secondary malignant neoplasm of brain: Secondary | ICD-10-CM | POA: Diagnosis not present

## 2022-09-30 DIAGNOSIS — N179 Acute kidney failure, unspecified: Secondary | ICD-10-CM | POA: Diagnosis not present

## 2022-09-30 DIAGNOSIS — C7972 Secondary malignant neoplasm of left adrenal gland: Secondary | ICD-10-CM | POA: Diagnosis not present

## 2022-09-30 DIAGNOSIS — C7889 Secondary malignant neoplasm of other digestive organs: Secondary | ICD-10-CM | POA: Diagnosis not present

## 2022-09-30 LAB — RAD ONC ARIA SESSION SUMMARY
Course Elapsed Days: 9
Plan Fractions Treated to Date: 8
Plan Prescribed Dose Per Fraction: 3 Gy
Plan Total Fractions Prescribed: 10
Plan Total Prescribed Dose: 30 Gy
Reference Point Dosage Given to Date: 24 Gy
Reference Point Session Dosage Given: 3 Gy
Session Number: 8

## 2022-10-01 ENCOUNTER — Ambulatory Visit
Admission: RE | Admit: 2022-10-01 | Discharge: 2022-10-01 | Disposition: A | Discharge status: Home / Self Care | Payer: Medicare HMO | Source: Ambulatory Visit | Attending: Radiation Oncology | Admitting: Radiation Oncology

## 2022-10-01 ENCOUNTER — Other Ambulatory Visit: Payer: Self-pay

## 2022-10-01 ENCOUNTER — Inpatient Hospital Stay: Payer: Medicare HMO

## 2022-10-01 DIAGNOSIS — Z5111 Encounter for antineoplastic chemotherapy: Secondary | ICD-10-CM | POA: Diagnosis not present

## 2022-10-01 DIAGNOSIS — C7971 Secondary malignant neoplasm of right adrenal gland: Secondary | ICD-10-CM | POA: Diagnosis not present

## 2022-10-01 DIAGNOSIS — C7889 Secondary malignant neoplasm of other digestive organs: Secondary | ICD-10-CM | POA: Diagnosis not present

## 2022-10-01 DIAGNOSIS — Z510 Encounter for antineoplastic radiation therapy: Secondary | ICD-10-CM | POA: Diagnosis not present

## 2022-10-01 DIAGNOSIS — C3432 Malignant neoplasm of lower lobe, left bronchus or lung: Secondary | ICD-10-CM | POA: Diagnosis not present

## 2022-10-01 DIAGNOSIS — N179 Acute kidney failure, unspecified: Secondary | ICD-10-CM | POA: Diagnosis not present

## 2022-10-01 DIAGNOSIS — C7931 Secondary malignant neoplasm of brain: Secondary | ICD-10-CM | POA: Diagnosis not present

## 2022-10-01 DIAGNOSIS — C7972 Secondary malignant neoplasm of left adrenal gland: Secondary | ICD-10-CM | POA: Diagnosis not present

## 2022-10-01 DIAGNOSIS — C779 Secondary and unspecified malignant neoplasm of lymph node, unspecified: Secondary | ICD-10-CM | POA: Diagnosis not present

## 2022-10-01 LAB — RAD ONC ARIA SESSION SUMMARY
Course Elapsed Days: 10
Plan Fractions Treated to Date: 9
Plan Prescribed Dose Per Fraction: 3 Gy
Plan Total Fractions Prescribed: 10
Plan Total Prescribed Dose: 30 Gy
Reference Point Dosage Given to Date: 27 Gy
Reference Point Session Dosage Given: 3 Gy
Session Number: 9

## 2022-10-02 ENCOUNTER — Ambulatory Visit
Admission: RE | Admit: 2022-10-02 | Discharge: 2022-10-02 | Disposition: A | Discharge status: Home / Self Care | Payer: Medicare HMO | Source: Ambulatory Visit | Attending: Radiation Oncology | Admitting: Radiation Oncology

## 2022-10-02 ENCOUNTER — Encounter: Payer: Self-pay | Admitting: Radiation Oncology

## 2022-10-02 ENCOUNTER — Inpatient Hospital Stay: Payer: Medicare HMO

## 2022-10-02 ENCOUNTER — Other Ambulatory Visit: Payer: Self-pay

## 2022-10-02 DIAGNOSIS — C779 Secondary and unspecified malignant neoplasm of lymph node, unspecified: Secondary | ICD-10-CM | POA: Diagnosis not present

## 2022-10-02 DIAGNOSIS — Z5111 Encounter for antineoplastic chemotherapy: Secondary | ICD-10-CM | POA: Diagnosis not present

## 2022-10-02 DIAGNOSIS — C3432 Malignant neoplasm of lower lobe, left bronchus or lung: Secondary | ICD-10-CM | POA: Diagnosis not present

## 2022-10-02 DIAGNOSIS — N179 Acute kidney failure, unspecified: Secondary | ICD-10-CM | POA: Diagnosis not present

## 2022-10-02 DIAGNOSIS — Z510 Encounter for antineoplastic radiation therapy: Secondary | ICD-10-CM | POA: Diagnosis not present

## 2022-10-02 DIAGNOSIS — C7971 Secondary malignant neoplasm of right adrenal gland: Secondary | ICD-10-CM | POA: Diagnosis not present

## 2022-10-02 DIAGNOSIS — C7931 Secondary malignant neoplasm of brain: Secondary | ICD-10-CM | POA: Diagnosis not present

## 2022-10-02 DIAGNOSIS — C7889 Secondary malignant neoplasm of other digestive organs: Secondary | ICD-10-CM | POA: Diagnosis not present

## 2022-10-02 DIAGNOSIS — C7972 Secondary malignant neoplasm of left adrenal gland: Secondary | ICD-10-CM | POA: Diagnosis not present

## 2022-10-02 LAB — RAD ONC ARIA SESSION SUMMARY
Course Elapsed Days: 11
Plan Fractions Treated to Date: 10
Plan Prescribed Dose Per Fraction: 3 Gy
Plan Total Fractions Prescribed: 10
Plan Total Prescribed Dose: 30 Gy
Reference Point Dosage Given to Date: 30 Gy
Reference Point Session Dosage Given: 3 Gy
Session Number: 10

## 2022-10-05 NOTE — Progress Notes (Signed)
                                                                                                                                                             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 He had some dermatitis from radiation posterior to his ears. He had a syncopal episode after taking a hot shower, but no other direct side effects from radiation was noted.   Plan: The patient will receive a call in about one month from the radiation oncology department. He will continue follow up with Dr. Julien Nordmann and be followed in the brain oncology program as well.   ________________________________________________    Carola Rhine, PAC

## 2022-10-06 ENCOUNTER — Inpatient Hospital Stay: Payer: Medicare HMO

## 2022-10-06 DIAGNOSIS — Z79899 Other long term (current) drug therapy: Secondary | ICD-10-CM | POA: Diagnosis not present

## 2022-10-06 DIAGNOSIS — C7971 Secondary malignant neoplasm of right adrenal gland: Secondary | ICD-10-CM | POA: Insufficient documentation

## 2022-10-06 DIAGNOSIS — C7972 Secondary malignant neoplasm of left adrenal gland: Secondary | ICD-10-CM | POA: Insufficient documentation

## 2022-10-06 DIAGNOSIS — Z5112 Encounter for antineoplastic immunotherapy: Secondary | ICD-10-CM | POA: Insufficient documentation

## 2022-10-06 DIAGNOSIS — Z95828 Presence of other vascular implants and grafts: Secondary | ICD-10-CM

## 2022-10-06 DIAGNOSIS — R634 Abnormal weight loss: Secondary | ICD-10-CM | POA: Insufficient documentation

## 2022-10-06 DIAGNOSIS — I10 Essential (primary) hypertension: Secondary | ICD-10-CM | POA: Insufficient documentation

## 2022-10-06 DIAGNOSIS — Z5111 Encounter for antineoplastic chemotherapy: Secondary | ICD-10-CM | POA: Insufficient documentation

## 2022-10-06 DIAGNOSIS — C3432 Malignant neoplasm of lower lobe, left bronchus or lung: Secondary | ICD-10-CM | POA: Diagnosis not present

## 2022-10-06 DIAGNOSIS — C3490 Malignant neoplasm of unspecified part of unspecified bronchus or lung: Secondary | ICD-10-CM

## 2022-10-06 LAB — CBC WITH DIFFERENTIAL (CANCER CENTER ONLY)
Abs Immature Granulocytes: 0.06 10*3/uL (ref 0.00–0.07)
Basophils Absolute: 0.1 10*3/uL (ref 0.0–0.1)
Basophils Relative: 1 %
Eosinophils Absolute: 0.4 10*3/uL (ref 0.0–0.5)
Eosinophils Relative: 4 %
HCT: 33.3 % — ABNORMAL LOW (ref 39.0–52.0)
Hemoglobin: 11.5 g/dL — ABNORMAL LOW (ref 13.0–17.0)
Immature Granulocytes: 1 %
Lymphocytes Relative: 17 %
Lymphs Abs: 1.5 10*3/uL (ref 0.7–4.0)
MCH: 30.4 pg (ref 26.0–34.0)
MCHC: 34.5 g/dL (ref 30.0–36.0)
MCV: 88.1 fL (ref 80.0–100.0)
Monocytes Absolute: 1.1 10*3/uL — ABNORMAL HIGH (ref 0.1–1.0)
Monocytes Relative: 13 %
Neutro Abs: 5.8 10*3/uL (ref 1.7–7.7)
Neutrophils Relative %: 64 %
Platelet Count: 240 10*3/uL (ref 150–400)
RBC: 3.78 MIL/uL — ABNORMAL LOW (ref 4.22–5.81)
RDW: 14.6 % (ref 11.5–15.5)
WBC Count: 9 10*3/uL (ref 4.0–10.5)
nRBC: 0 % (ref 0.0–0.2)

## 2022-10-06 LAB — CMP (CANCER CENTER ONLY)
ALT: 17 U/L (ref 0–44)
AST: 22 U/L (ref 15–41)
Albumin: 4 g/dL (ref 3.5–5.0)
Alkaline Phosphatase: 70 U/L (ref 38–126)
Anion gap: 9 (ref 5–15)
BUN: 13 mg/dL (ref 6–20)
CO2: 27 mmol/L (ref 22–32)
Calcium: 9 mg/dL (ref 8.9–10.3)
Chloride: 103 mmol/L (ref 98–111)
Creatinine: 1.08 mg/dL (ref 0.61–1.24)
GFR, Estimated: >60 mL/min (ref >60)
Glucose, Bld: 88 mg/dL (ref 70–99)
Potassium: 4.1 mmol/L (ref 3.5–5.1)
Sodium: 139 mmol/L (ref 135–145)
Total Bilirubin: 0.4 mg/dL (ref 0.3–1.2)
Total Protein: 8 g/dL (ref 6.5–8.1)

## 2022-10-06 MED ORDER — HEPARIN SOD (PORK) LOCK FLUSH 100 UNIT/ML IV SOLN
500.0000 [IU] | Freq: Once | INTRAVENOUS | Status: AC
  Administered 2022-10-06: 500 [IU]

## 2022-10-06 MED ORDER — SODIUM CHLORIDE 0.9% FLUSH
10.0000 mL | Freq: Once | INTRAVENOUS | Status: AC
  Administered 2022-10-06: 10 mL

## 2022-10-09 MED FILL — Fosaprepitant Dimeglumine For IV Infusion 150 MG (Base Eq): INTRAVENOUS | Qty: 5 | Status: AC

## 2022-10-09 MED FILL — Dexamethasone Sodium Phosphate Inj 100 MG/10ML: INTRAMUSCULAR | Qty: 1 | Status: AC

## 2022-10-12 ENCOUNTER — Inpatient Hospital Stay: Payer: Medicare HMO

## 2022-10-12 ENCOUNTER — Inpatient Hospital Stay (HOSPITAL_BASED_OUTPATIENT_CLINIC_OR_DEPARTMENT_OTHER): Payer: Medicare HMO | Admitting: Internal Medicine

## 2022-10-12 DIAGNOSIS — C3490 Malignant neoplasm of unspecified part of unspecified bronchus or lung: Secondary | ICD-10-CM

## 2022-10-12 DIAGNOSIS — C7971 Secondary malignant neoplasm of right adrenal gland: Secondary | ICD-10-CM | POA: Diagnosis not present

## 2022-10-12 DIAGNOSIS — Z79899 Other long term (current) drug therapy: Secondary | ICD-10-CM | POA: Diagnosis not present

## 2022-10-12 DIAGNOSIS — I10 Essential (primary) hypertension: Secondary | ICD-10-CM | POA: Diagnosis not present

## 2022-10-12 DIAGNOSIS — C3432 Malignant neoplasm of lower lobe, left bronchus or lung: Secondary | ICD-10-CM | POA: Diagnosis not present

## 2022-10-12 DIAGNOSIS — Z5112 Encounter for antineoplastic immunotherapy: Secondary | ICD-10-CM | POA: Diagnosis not present

## 2022-10-12 DIAGNOSIS — R634 Abnormal weight loss: Secondary | ICD-10-CM | POA: Diagnosis not present

## 2022-10-12 DIAGNOSIS — Z95828 Presence of other vascular implants and grafts: Secondary | ICD-10-CM

## 2022-10-12 DIAGNOSIS — Z5111 Encounter for antineoplastic chemotherapy: Secondary | ICD-10-CM | POA: Diagnosis not present

## 2022-10-12 DIAGNOSIS — C7972 Secondary malignant neoplasm of left adrenal gland: Secondary | ICD-10-CM | POA: Diagnosis not present

## 2022-10-12 LAB — CMP (CANCER CENTER ONLY)
ALT: 16 U/L (ref 0–44)
AST: 19 U/L (ref 15–41)
Albumin: 3.9 g/dL (ref 3.5–5.0)
Alkaline Phosphatase: 63 U/L (ref 38–126)
Anion gap: 8 (ref 5–15)
BUN: 18 mg/dL (ref 6–20)
CO2: 27 mmol/L (ref 22–32)
Calcium: 8.2 mg/dL — ABNORMAL LOW (ref 8.9–10.3)
Chloride: 105 mmol/L (ref 98–111)
Creatinine: 1 mg/dL (ref 0.61–1.24)
GFR, Estimated: >60 mL/min (ref >60)
Glucose, Bld: 93 mg/dL (ref 70–99)
Potassium: 4.1 mmol/L (ref 3.5–5.1)
Sodium: 140 mmol/L (ref 135–145)
Total Bilirubin: 0.4 mg/dL (ref 0.3–1.2)
Total Protein: 7.7 g/dL (ref 6.5–8.1)

## 2022-10-12 LAB — CBC WITH DIFFERENTIAL (CANCER CENTER ONLY)
Abs Immature Granulocytes: 0.04 10*3/uL (ref 0.00–0.07)
Basophils Absolute: 0.1 10*3/uL (ref 0.0–0.1)
Basophils Relative: 1 %
Eosinophils Absolute: 0.4 10*3/uL (ref 0.0–0.5)
Eosinophils Relative: 5 %
HCT: 33.9 % — ABNORMAL LOW (ref 39.0–52.0)
Hemoglobin: 11.6 g/dL — ABNORMAL LOW (ref 13.0–17.0)
Immature Granulocytes: 1 %
Lymphocytes Relative: 19 %
Lymphs Abs: 1.5 10*3/uL (ref 0.7–4.0)
MCH: 30.2 pg (ref 26.0–34.0)
MCHC: 34.2 g/dL (ref 30.0–36.0)
MCV: 88.3 fL (ref 80.0–100.0)
Monocytes Absolute: 1 10*3/uL (ref 0.1–1.0)
Monocytes Relative: 13 %
Neutro Abs: 4.9 10*3/uL (ref 1.7–7.7)
Neutrophils Relative %: 61 %
Platelet Count: 225 10*3/uL (ref 150–400)
RBC: 3.84 MIL/uL — ABNORMAL LOW (ref 4.22–5.81)
RDW: 14.4 % (ref 11.5–15.5)
WBC Count: 7.8 10*3/uL (ref 4.0–10.5)
nRBC: 0 % (ref 0.0–0.2)

## 2022-10-12 LAB — TSH: TSH: 0.078 u[IU]/mL — ABNORMAL LOW (ref 0.350–4.500)

## 2022-10-12 MED ORDER — SODIUM CHLORIDE 0.9 % IV SOLN: Freq: Once | INTRAVENOUS | Status: AC

## 2022-10-12 MED ORDER — SODIUM CHLORIDE 0.9 % IV SOLN
150.0000 mg | Freq: Once | INTRAVENOUS | Status: AC
  Administered 2022-10-12: 150 mg via INTRAVENOUS
  Filled 2022-10-12: qty 150

## 2022-10-12 MED ORDER — SODIUM CHLORIDE 0.9% FLUSH
10.0000 mL | Freq: Once | INTRAVENOUS | Status: AC
  Administered 2022-10-12: 10 mL

## 2022-10-12 MED ORDER — PALONOSETRON HCL INJECTION 0.25 MG/5ML
0.2500 mg | Freq: Once | INTRAVENOUS | Status: AC
  Administered 2022-10-12: 0.25 mg via INTRAVENOUS
  Filled 2022-10-12: qty 5

## 2022-10-12 MED ORDER — SODIUM CHLORIDE 0.9 % IV SOLN
332.4000 mg | Freq: Once | INTRAVENOUS | Status: AC
  Administered 2022-10-12: 350 mg via INTRAVENOUS
  Filled 2022-10-12: qty 35

## 2022-10-12 MED ORDER — HEPARIN SOD (PORK) LOCK FLUSH 100 UNIT/ML IV SOLN
500.0000 [IU] | Freq: Once | INTRAVENOUS | Status: AC | PRN
  Administered 2022-10-12: 500 [IU]

## 2022-10-12 MED ORDER — SODIUM CHLORIDE 0.9 % IV SOLN
10.0000 mg | Freq: Once | INTRAVENOUS | Status: AC
  Administered 2022-10-12: 10 mg via INTRAVENOUS
  Filled 2022-10-12: qty 10

## 2022-10-12 MED ORDER — SODIUM CHLORIDE 0.9 % IV SOLN
90.0000 mg/m2 | Freq: Once | INTRAVENOUS | Status: AC
  Administered 2022-10-12: 178 mg via INTRAVENOUS
  Filled 2022-10-12: qty 8.9

## 2022-10-12 MED ORDER — TRILACICLIB DIHYDROCHLORIDE INJECTION 300 MG
240.0000 mg/m2 | Freq: Once | INTRAVENOUS | Status: AC
  Administered 2022-10-12: 480 mg via INTRAVENOUS
  Filled 2022-10-12: qty 32

## 2022-10-12 MED ORDER — SODIUM CHLORIDE 0.9% FLUSH
10.0000 mL | INTRAVENOUS | Status: DC | PRN
  Administered 2022-10-12: 10 mL

## 2022-10-12 MED ORDER — SODIUM CHLORIDE 0.9 % IV SOLN
1500.0000 mg | Freq: Once | INTRAVENOUS | Status: AC
  Administered 2022-10-12: 1500 mg via INTRAVENOUS
  Filled 2022-10-12: qty 30

## 2022-10-12 MED FILL — Dexamethasone Sodium Phosphate Inj 100 MG/10ML: INTRAMUSCULAR | Qty: 1 | Status: AC

## 2022-10-12 NOTE — Progress Notes (Signed)
[facility] [phone]   [phone]  OFFICE PROGRESS NOTE  [name], NP [place] [address]  DIAGNOSIS: Extensive stage (T4, N3, M1 C) small cell lung cancer presented with obstructing left lower lobe lung mass with left extrapleural lymph node, bilateral adrenal metastasis, pancreatic lesion as well as upper abdominal lymphadenopathy and soft tissue nodules in the abdominal wall as well as the abdomen diagnosed in December 2023.  PRIOR THERAPY: None  CURRENT THERAPY: Systemic chemotherapy with carboplatin for AUC of 5 on day 1, etoposide 100 Mg/M2 on days 1, 2 and 3 with Cosela 240 Mg/M2 on the days of the chemotherapy and Imfinzi 1500 Mg IV every 3 weeks.  First dose August 10, 2022.  Status post 2 cycles.   INTERVAL HISTORY: Brett Campbell 62 y.o. male returns to the clinic today for follow-up visit accompanied by his sister and his sign interpreter.  The patient is feeling fine with no concerning complaints except for fatigue and feeling cold most of the time.  He denied having any current chest pain but has shortness of breath with exertion with mild cough and no hemoptysis.  He has no nausea, vomiting, diarrhea or constipation.  He has no headache or visual changes.  He denied having any fever or chills.  He lost few pounds since his last visit.  He is here today for evaluation before starting cycle #3 of his treatment.  MEDICAL HISTORY: Past Medical History:  Diagnosis Date   Calcium deficiency    Cataract    COPD (chronic obstructive pulmonary disease) ([place])    moderate emphysema   Deaf    Hypertension    Hypothyroidism    Personal history of colonic polyps 11/03/2020   Pneumonia    August 2023   Scarlet fever    Sleep apnea    never used a Cpap, used to use O2   Thyroid disease    Tuberculosis    [place]. Health department is contact with him due to hx.    ALLERGIES:  is allergic to  penicillins.  MEDICATIONS:  Current Outpatient Medications  Medication Sig Dispense Refill   amLODipine (NORVASC) 5 MG tablet Take 1 tablet (5 mg total) by mouth daily. 90 tablet 3   atorvastatin (LIPITOR) 10 MG tablet TAKE 1 TABLET EVERY DAY 90 tablet 3   calcitRIOL (ROCALTROL) 0.25 MCG capsule Take 1 capsule (0.25 mcg total) by mouth daily. 90 capsule 3   calcium carbonate (TUMS) 500 MG chewable tablet Chew 1 tablet (200 mg of elemental calcium total) by mouth 3 (three) times daily. 90 tablet 3   colestipol (COLESTID) 1 g tablet Take 1 tablet (1 g total) by mouth 2 (two) times daily. (Patient taking differently: Take 1 g by mouth daily.) 60 tablet 5   guaifenesin (MUCUS RELIEF CHEST CONGESTION) 400 MG TABS tablet Take 400 mg by mouth every 4 (four) hours as needed (cough).     HYDROcodone bit-homatropine (HYCODAN) 5-1.5 MG/5ML syrup Take 5 mLs by mouth every 6 hours as needed for cough. 240 mL 0   ibuprofen (ADVIL) 200 MG tablet Take 400-800 mg by mouth every 6 (six) hours as needed for moderate pain.     levofloxacin (LEVAQUIN) 500 MG tablet Take 1 tablet (500 mg total) by mouth every other day. 2 tablet 0   levothyroxine (SYNTHROID) 150 MCG tablet Take 1 tablet (150 mcg total) by mouth daily. (Patient taking differently: Take  150 mcg by mouth daily before breakfast.) 90 tablet 3   lidocaine-prilocaine (EMLA) cream Apply to affected area once 30 g 3   loperamide (IMODIUM A-D) 2 MG tablet Take 1 tablet (2 mg total) by mouth 4 (four) times daily as needed for diarrhea or loose stools. 30 tablet 0   ondansetron (ZOFRAN) 8 MG tablet Take 1 tablet (8 mg total) by mouth every 8 (eight) hours as needed for nausea, vomiting or refractory nausea / vomiting. Start on the third day after carboplatin. (Patient taking differently: Take 8 mg by mouth every 6 (six) hours as needed for nausea, vomiting or refractory nausea / vomiting. Start on the third day after carboplatin.) 30 tablet 1   potassium chloride  SA (KLOR-CON M) 20 MEQ tablet TAKE 1 TABLET EVERY DAY 90 tablet 3   prochlorperazine (COMPAZINE) 10 MG tablet Take 1 tablet (10 mg total) by mouth every 6 (six) hours as needed. (Patient taking differently: Take 10 mg by mouth every 6 (six) hours as needed for nausea or vomiting.) 30 tablet 2   No current facility-administered medications for this visit.    SURGICAL HISTORY:  Past Surgical History:  Procedure Laterality Date   CHOLECYSTECTOMY     COLONOSCOPY     +5years   IR IMAGING GUIDED PORT INSERTION  08/14/2022   LAPAROSCOPIC RIGHT HEMI COLECTOMY N/A 02/26/2021   Procedure: LAPAROSCOPIC RIGHT HEMI COLECTOMY;  Surgeon: Ileana Roup, MD;  Location: [facility];  Service: General;  Laterality: N/A;   THYROIDECTOMY     VIDEO BRONCHOSCOPY WITH ENDOBRONCHIAL ULTRASOUND N/A 07/24/2022   Procedure: VIDEO BRONCHOSCOPY WITH ENDOBRONCHIAL ULTRASOUND  WITH LEFT LOWER LOBE  MASS BIOPSIES;  Surgeon: [name], MD;  Location: [facility];  Service: Pulmonary;  Laterality: N/A;    REVIEW OF SYSTEMS:  Constitutional: positive for anorexia, fatigue, and weight loss Eyes: negative Ears, nose, mouth, throat, and face: negative Respiratory: positive for cough and dyspnea on exertion Cardiovascular: negative Gastrointestinal: negative Genitourinary:negative Integument/breast: negative Hematologic/lymphatic: negative Musculoskeletal:negative Neurological: negative Behavioral/Psych: negative Endocrine: negative Allergic/Immunologic: negative   PHYSICAL EXAMINATION: General appearance: alert, cooperative, and fatigued Head: Normocephalic, without obvious abnormality, atraumatic Neck: no adenopathy, no JVD, supple, symmetrical, trachea midline, and thyroid not enlarged, symmetric, no tenderness/mass/nodules Lymph nodes: Cervical, supraclavicular, and axillary nodes normal. Resp: clear to auscultation bilaterally Back: symmetric, no curvature. ROM normal. No CVA tenderness. Cardio: regular rate  and rhythm, S1, S2 normal, no murmur, click, rub or gallop GI: soft, non-tender; bowel sounds normal; no masses,  no organomegaly Extremities: extremities normal, atraumatic, no cyanosis or edema Neurologic: Alert and oriented X 3, normal strength and tone. Normal symmetric reflexes. Normal coordination and gait  ECOG PERFORMANCE STATUS: 1 - Symptomatic but completely ambulatory  Blood pressure 125/87, pulse 86, temperature (!) 97.5 F (36.4 C), temperature source Oral, resp. rate 17, height 5\' 10"  (1.778 m), weight 160 lb 11.2 oz (72.9 kg), SpO2 100 %.  LABORATORY DATA: Lab Results  Component Value Date   WBC 7.8 10/12/2022   HGB 11.6 (L) 10/12/2022   HCT 33.9 (L) 10/12/2022   MCV 88.3 10/12/2022   PLT 225 10/12/2022      Chemistry      Component Value Date/Time   NA 140 10/12/2022 1000   NA 139 04/28/2022 1038   K 4.1 10/12/2022 1000   CL 105 10/12/2022 1000   CO2 27 10/12/2022 1000   BUN 18 10/12/2022 1000   BUN 14 04/28/2022 1038   CREATININE 1.00 10/12/2022 1000  Component Value Date/Time   CALCIUM 8.2 (L) 10/12/2022 1000   ALKPHOS 63 10/12/2022 1000   AST 19 10/12/2022 1000   ALT 16 10/12/2022 1000   BILITOT 0.4 10/12/2022 1000       RADIOGRAPHIC STUDIES: CT Chest Wo Contrast  Result Date: 09/17/2022 CLINICAL DATA:  Small cell lung cancer with metastatic disease to the adrenal glands, pancreas and lymph nodes. Restaging. Patient reports shortness of breath and diarrhea. * Tracking Code: BO *. EXAM: CT CHEST, ABDOMEN AND PELVIS WITHOUT CONTRAST TECHNIQUE: Multidetector CT imaging of the chest, abdomen and pelvis was performed following the standard protocol without IV contrast. RADIATION DOSE REDUCTION: This exam was performed according to the departmental dose-optimization program which includes automated exposure control, adjustment of the mA and/or kV according to patient size and/or use of iterative reconstruction technique. COMPARISON:  PET-CT 07/27/2022.  Chest CT 06/19/2022. Abdominal CT 10/30/2020. FINDINGS: CT CHEST FINDINGS Cardiovascular: Right IJ Port-A-Cath extends to the superior aspect of the right atrium. Mild atherosclerosis of the aorta, great vessels and coronary arteries. The heart size is normal. There is no pericardial effusion. Mediastinum/Nodes: 10 mm short axis subcarinal lymph node on image 29/2 is unchanged in size from the PET-CT at which time it demonstrated moderate hypermetabolic activity. Additional small mediastinal lymph nodes are stable, not hypermetabolic on PET-CT. No enlarging mediastinal, hilar or axillary lymph nodes are identified. Stable postsurgical changes at the thoracic inlet consistent with prior thyroidectomy. The esophagus appears unremarkable. Lungs/Pleura: No pleural effusion or pneumothorax. Persistent mass-like consolidation in the left lower lobe with interval central cavitation and partial re-expansion inferomedially. The focal infrahilar mass is not well defined on this noncontrast study, although does appear somewhat smaller. There is persistent central occlusion of the left lower lobe bronchus. Underlying mild centrilobular emphysema with interval diffuse increase in central airway thickening and patchy peribronchovascular ground-glass opacities throughout both lungs, likely treatment related or infectious. There is some peribronchovascular nodularity in the left lower lobe such that lymphangitic spread of tumor cannot be completely excluded. Musculoskeletal/Chest wall: No chest wall mass or suspicious osseous findings. CT ABDOMEN AND PELVIS FINDINGS Hepatobiliary: No focal hepatic abnormalities are identified on noncontrast imaging. No significant biliary dilatation status post cholecystectomy. Pancreas: No focal pancreatic masses are identified on noncontrast imaging. There were focal hypermetabolic lesions within the pancreatic head, body and tail on prior PET-CT. No pancreatic ductal dilatation or surrounding  inflammation. Spleen: Normal in size without focal abnormality. Adrenals/Urinary Tract: Interval decrease in size of the bilateral adrenal metastases. Right adrenal mass measures 1.8 x 1.4 cm on image 54/2 (previously up to 4.1 cm) and left adrenal mass measures 2.6 x 2.2 cm on image 59/2 (previously 4.2 x 3.6 cm). No evidence of urinary tract calculus, suspicious renal lesion or hydronephrosis. The bladder appears normal for its degree of distention. Stomach/Bowel: No enteric contrast administered. Stable postsurgical changes from right hemicolectomy. The stomach appears unremarkable for its degree of distension. No evidence of bowel wall thickening, distention or surrounding inflammatory change. Vascular/Lymphatic: There are no enlarged abdominal or pelvic lymph nodes. Aortic and branch vessel atherosclerosis without evidence of aneurysm. Reproductive: The prostate gland and seminal vesicles appear unremarkable. Other: Previously demonstrated hypermetabolic retroperitoneal nodule adjacent to the left iliac crest has resolved. No ascites, peritoneal nodularity or abdominal wall hernia. No focal abdominal wall masses are identified. Musculoskeletal: No acute or significant osseous findings. Mild spondylosis. IMPRESSION: 1. Interval decrease in size of the left lower lobe mass with interval central cavitation and partial re-expansion of  the left lower lobe. 2. Interval decrease in size of the bilateral adrenal metastases. 3. Interval resolution of previously demonstrated hypermetabolic retroperitoneal nodule adjacent to the left iliac crest. 4. Previously demonstrated hypermetabolic pancreatic metastases are not well defined on noncontrast imaging. 5. No definite evidence of disease progression. 6. Interval diffuse increase in central airway thickening and patchy peribronchovascular ground-glass opacities throughout both lungs, likely treatment related or infectious. There is some peribronchovascular nodularity in  the left lower lobe such that lymphangitic spread of tumor cannot be completely excluded. Recommend attention on CT follow-up. 7. Aortic Atherosclerosis (ICD10-I70.0) and Emphysema (ICD10-J43.9). Electronically Signed   By: Richardean Sale M.D.   On: 09/17/2022 11:39   CT Abdomen Pelvis Wo Contrast  Result Date: 09/17/2022 CLINICAL DATA:  Small cell lung cancer with metastatic disease to the adrenal glands, pancreas and lymph nodes. Restaging. Patient reports shortness of breath and diarrhea. * Tracking Code: BO *. EXAM: CT CHEST, ABDOMEN AND PELVIS WITHOUT CONTRAST TECHNIQUE: Multidetector CT imaging of the chest, abdomen and pelvis was performed following the standard protocol without IV contrast. RADIATION DOSE REDUCTION: This exam was performed according to the departmental dose-optimization program which includes automated exposure control, adjustment of the mA and/or kV according to patient size and/or use of iterative reconstruction technique. COMPARISON:  PET-CT 07/27/2022. Chest CT 06/19/2022. Abdominal CT 10/30/2020. FINDINGS: CT CHEST FINDINGS Cardiovascular: Right IJ Port-A-Cath extends to the superior aspect of the right atrium. Mild atherosclerosis of the aorta, great vessels and coronary arteries. The heart size is normal. There is no pericardial effusion. Mediastinum/Nodes: 10 mm short axis subcarinal lymph node on image 29/2 is unchanged in size from the PET-CT at which time it demonstrated moderate hypermetabolic activity. Additional small mediastinal lymph nodes are stable, not hypermetabolic on PET-CT. No enlarging mediastinal, hilar or axillary lymph nodes are identified. Stable postsurgical changes at the thoracic inlet consistent with prior thyroidectomy. The esophagus appears unremarkable. Lungs/Pleura: No pleural effusion or pneumothorax. Persistent mass-like consolidation in the left lower lobe with interval central cavitation and partial re-expansion inferomedially. The focal  infrahilar mass is not well defined on this noncontrast study, although does appear somewhat smaller. There is persistent central occlusion of the left lower lobe bronchus. Underlying mild centrilobular emphysema with interval diffuse increase in central airway thickening and patchy peribronchovascular ground-glass opacities throughout both lungs, likely treatment related or infectious. There is some peribronchovascular nodularity in the left lower lobe such that lymphangitic spread of tumor cannot be completely excluded. Musculoskeletal/Chest wall: No chest wall mass or suspicious osseous findings. CT ABDOMEN AND PELVIS FINDINGS Hepatobiliary: No focal hepatic abnormalities are identified on noncontrast imaging. No significant biliary dilatation status post cholecystectomy. Pancreas: No focal pancreatic masses are identified on noncontrast imaging. There were focal hypermetabolic lesions within the pancreatic head, body and tail on prior PET-CT. No pancreatic ductal dilatation or surrounding inflammation. Spleen: Normal in size without focal abnormality. Adrenals/Urinary Tract: Interval decrease in size of the bilateral adrenal metastases. Right adrenal mass measures 1.8 x 1.4 cm on image 54/2 (previously up to 4.1 cm) and left adrenal mass measures 2.6 x 2.2 cm on image 59/2 (previously 4.2 x 3.6 cm). No evidence of urinary tract calculus, suspicious renal lesion or hydronephrosis. The bladder appears normal for its degree of distention. Stomach/Bowel: No enteric contrast administered. Stable postsurgical changes from right hemicolectomy. The stomach appears unremarkable for its degree of distension. No evidence of bowel wall thickening, distention or surrounding inflammatory change. Vascular/Lymphatic: There are no enlarged abdominal or pelvic lymph  nodes. Aortic and branch vessel atherosclerosis without evidence of aneurysm. Reproductive: The prostate gland and seminal vesicles appear unremarkable. Other:  Previously demonstrated hypermetabolic retroperitoneal nodule adjacent to the left iliac crest has resolved. No ascites, peritoneal nodularity or abdominal wall hernia. No focal abdominal wall masses are identified. Musculoskeletal: No acute or significant osseous findings. Mild spondylosis. IMPRESSION: 1. Interval decrease in size of the left lower lobe mass with interval central cavitation and partial re-expansion of the left lower lobe. 2. Interval decrease in size of the bilateral adrenal metastases. 3. Interval resolution of previously demonstrated hypermetabolic retroperitoneal nodule adjacent to the left iliac crest. 4. Previously demonstrated hypermetabolic pancreatic metastases are not well defined on noncontrast imaging. 5. No definite evidence of disease progression. 6. Interval diffuse increase in central airway thickening and patchy peribronchovascular ground-glass opacities throughout both lungs, likely treatment related or infectious. There is some peribronchovascular nodularity in the left lower lobe such that lymphangitic spread of tumor cannot be completely excluded. Recommend attention on CT follow-up. 7. Aortic Atherosclerosis (ICD10-I70.0) and Emphysema (ICD10-J43.9). Electronically Signed   By: Richardean Sale M.D.   On: 09/17/2022 11:39    ASSESSMENT AND PLAN: This is a very pleasant 62 years old white male with extensive stage (T4, N3, M1 C) small cell lung cancer presented with obstructing left lower lobe lung mass with left extrapleural lymph node, bilateral adrenal metastasis, pancreatic lesion as well as upper abdominal lymphadenopathy and soft tissue nodules in the abdominal wall as well as the abdomen diagnosed in December 2023. The patient started the first cycle of his systemic chemotherapy with carboplatin for AUC of 5 on day 1 and etoposide 100 Mg/M2 on days 1, 2 and 3 with Cosela before the chemotherapy and Imfinzi 1500 Mg IV on day 1 every 3 weeks.  Status post 2 cycle given on  August 10, 2022. The patient tolerated the second cycle of his treatment much better with less adverse effects. I recommended for him to proceed with cycle #3 today as planned. For the lack of appetite and weight loss, encouraged the patient to increase his oral intake with nutrition supplements. I will see him back for follow-up visit in 3 weeks for evaluation before starting cycle #4. He will continue to have weekly lab during the course of chemotherapy. The patient was advised to call immediately if he has any concerning symptoms in the interval. The patient voices understanding of current disease status and treatment options and is in agreement with the current care plan.  All questions were answered. The patient knows to call the clinic with any problems, questions or concerns. We can certainly see the patient much sooner if necessary.  The total time spent in the appointment was 30 minutes.  Disclaimer: This note was dictated with voice recognition software. Similar sounding words can inadvertently be transcribed and may not be corrected upon review.

## 2022-10-12 NOTE — Patient Instructions (Signed)
[facility]  Discharge Instructions: Thank you for choosing [facility] to provide your oncology and hematology care.   If you have a lab appointment with the [facility], please go directly to the [facility] and check in at the registration area.   Wear comfortable clothing and clothing appropriate for easy access to any Portacath or PICC line.   We strive to give you quality time with your provider. You may need to reschedule your appointment if you arrive late (15 or more minutes).  Arriving late affects you and other patients whose appointments are after yours.  Also, if you miss three or more appointments without notifying the office, you may be dismissed from the clinic at the provider's discretion.      For prescription refill requests, have your pharmacy contact our office and allow 72 hours for refills to be completed.    Today you received the following chemotherapy and/or immunotherapy agents: Cosela/Imfinzi/Carboplatin/Etoposide    To help prevent nausea and vomiting after your treatment, we encourage you to take your nausea medication as directed.  BELOW ARE SYMPTOMS THAT SHOULD BE REPORTED IMMEDIATELY: *FEVER GREATER THAN 100.4 F (38 C) OR HIGHER *CHILLS OR SWEATING *NAUSEA AND VOMITING THAT IS NOT CONTROLLED WITH YOUR NAUSEA MEDICATION *UNUSUAL SHORTNESS OF BREATH *UNUSUAL BRUISING OR BLEEDING *URINARY PROBLEMS (pain or burning when urinating, or frequent urination) *BOWEL PROBLEMS (unusual diarrhea, constipation, pain near the anus) TENDERNESS IN MOUTH AND THROAT WITH OR WITHOUT PRESENCE OF ULCERS (sore throat, sores in mouth, or a toothache) UNUSUAL RASH, SWELLING OR PAIN  UNUSUAL VAGINAL DISCHARGE OR ITCHING   Items with * indicate a potential emergency and should be followed up as soon as possible or go to the Emergency Department if any problems should occur.  Please show the CHEMOTHERAPY ALERT CARD or  IMMUNOTHERAPY ALERT CARD at check-in to the Emergency Department and triage nurse.  Should you have questions after your visit or need to cancel or reschedule your appointment, please contact [facility]  Dept: [phone]  and follow the prompts.  Office hours are 8:00 a.m. to 4:30 p.m. Monday - Friday. Please note that voicemails left after 4:00 p.m. may not be returned until the following business day.  We are closed weekends and major holidays. You have access to a nurse at all times for urgent questions. Please call the main number to the clinic Dept: [phone] and follow the prompts.   For any non-urgent questions, you may also contact your provider using MyChart. We now offer e-Visits for anyone 78 and older to request care online for non-urgent symptoms. For details visit [email or web address].   Also download the MyChart app! Go to the app store, search "MyChart", open the app, select [facility], and log in with your MyChart username and password.

## 2022-10-13 ENCOUNTER — Inpatient Hospital Stay: Payer: Medicare HMO

## 2022-10-13 ENCOUNTER — Other Ambulatory Visit: Payer: Self-pay

## 2022-10-13 ENCOUNTER — Telehealth: Payer: Self-pay | Admitting: Medical Oncology

## 2022-10-13 VITALS — BP 136/55 | HR 85 | Temp 97.7°F | Resp 17

## 2022-10-13 DIAGNOSIS — C7971 Secondary malignant neoplasm of right adrenal gland: Secondary | ICD-10-CM | POA: Diagnosis not present

## 2022-10-13 DIAGNOSIS — C7972 Secondary malignant neoplasm of left adrenal gland: Secondary | ICD-10-CM | POA: Diagnosis not present

## 2022-10-13 DIAGNOSIS — Z5112 Encounter for antineoplastic immunotherapy: Secondary | ICD-10-CM | POA: Diagnosis not present

## 2022-10-13 DIAGNOSIS — Z5111 Encounter for antineoplastic chemotherapy: Secondary | ICD-10-CM | POA: Diagnosis not present

## 2022-10-13 DIAGNOSIS — C3432 Malignant neoplasm of lower lobe, left bronchus or lung: Secondary | ICD-10-CM | POA: Diagnosis not present

## 2022-10-13 DIAGNOSIS — Z79899 Other long term (current) drug therapy: Secondary | ICD-10-CM | POA: Diagnosis not present

## 2022-10-13 DIAGNOSIS — I10 Essential (primary) hypertension: Secondary | ICD-10-CM | POA: Diagnosis not present

## 2022-10-13 DIAGNOSIS — R634 Abnormal weight loss: Secondary | ICD-10-CM | POA: Diagnosis not present

## 2022-10-13 DIAGNOSIS — C3490 Malignant neoplasm of unspecified part of unspecified bronchus or lung: Secondary | ICD-10-CM

## 2022-10-13 MED ORDER — HEPARIN SOD (PORK) LOCK FLUSH 100 UNIT/ML IV SOLN
500.0000 [IU] | Freq: Once | INTRAVENOUS | Status: AC | PRN
  Administered 2022-10-13: 500 [IU]

## 2022-10-13 MED ORDER — SODIUM CHLORIDE 0.9 % IV SOLN
10.0000 mg | Freq: Once | INTRAVENOUS | Status: AC
  Administered 2022-10-13: 10 mg via INTRAVENOUS
  Filled 2022-10-13: qty 10

## 2022-10-13 MED ORDER — TRILACICLIB DIHYDROCHLORIDE INJECTION 300 MG
240.0000 mg/m2 | Freq: Once | INTRAVENOUS | Status: AC
  Administered 2022-10-13: 480 mg via INTRAVENOUS
  Filled 2022-10-13: qty 32

## 2022-10-13 MED ORDER — SODIUM CHLORIDE 0.9 % IV SOLN: Freq: Once | INTRAVENOUS | Status: AC

## 2022-10-13 MED ORDER — SODIUM CHLORIDE 0.9% FLUSH
10.0000 mL | INTRAVENOUS | Status: DC | PRN
  Administered 2022-10-13: 10 mL

## 2022-10-13 MED ORDER — SODIUM CHLORIDE 0.9 % IV SOLN
90.0000 mg/m2 | Freq: Once | INTRAVENOUS | Status: AC
  Administered 2022-10-13: 178 mg via INTRAVENOUS
  Filled 2022-10-13: qty 8.9

## 2022-10-13 MED FILL — Dexamethasone Sodium Phosphate Inj 100 MG/10ML: INTRAMUSCULAR | Qty: 1 | Status: AC

## 2022-10-13 NOTE — Patient Instructions (Signed)
[facility]  Discharge Instructions: Thank you for choosing [facility] to provide your oncology and hematology care.   If you have a lab appointment with the [facility], please go directly to the [facility] and check in at the registration area.   Wear comfortable clothing and clothing appropriate for easy access to any Portacath or PICC line.   We strive to give you quality time with your provider. You may need to reschedule your appointment if you arrive late (15 or more minutes).  Arriving late affects you and other patients whose appointments are after yours.  Also, if you miss three or more appointments without notifying the office, you may be dismissed from the clinic at the provider's discretion.      For prescription refill requests, have your pharmacy contact our office and allow 72 hours for refills to be completed.    Today you received the following chemotherapy and/or immunotherapy agents Cosela & Etoposide      To help prevent nausea and vomiting after your treatment, we encourage you to take your nausea medication as directed.  BELOW ARE SYMPTOMS THAT SHOULD BE REPORTED IMMEDIATELY: *FEVER GREATER THAN 100.4 F (38 C) OR HIGHER *CHILLS OR SWEATING *NAUSEA AND VOMITING THAT IS NOT CONTROLLED WITH YOUR NAUSEA MEDICATION *UNUSUAL SHORTNESS OF BREATH *UNUSUAL BRUISING OR BLEEDING *URINARY PROBLEMS (pain or burning when urinating, or frequent urination) *BOWEL PROBLEMS (unusual diarrhea, constipation, pain near the anus) TENDERNESS IN MOUTH AND THROAT WITH OR WITHOUT PRESENCE OF ULCERS (sore throat, sores in mouth, or a toothache) UNUSUAL RASH, SWELLING OR PAIN  UNUSUAL VAGINAL DISCHARGE OR ITCHING   Items with * indicate a potential emergency and should be followed up as soon as possible or go to the Emergency Department if any problems should occur.  Please show the CHEMOTHERAPY ALERT CARD or IMMUNOTHERAPY ALERT CARD  at check-in to the Emergency Department and triage nurse.  Should you have questions after your visit or need to cancel or reschedule your appointment, please contact [facility]  Dept: [phone]  and follow the prompts.  Office hours are 8:00 a.m. to 4:30 p.m. Monday - Friday. Please note that voicemails left after 4:00 p.m. may not be returned until the following business day.  We are closed weekends and major holidays. You have access to a nurse at all times for urgent questions. Please call the main number to the clinic Dept: [phone] and follow the prompts.   For any non-urgent questions, you may also contact your provider using MyChart. We now offer e-Visits for anyone 31 and older to request care online for non-urgent symptoms. For details visit [email or web address].   Also download the MyChart app! Go to the app store, search "MyChart", open the app, select [facility], and log in with your MyChart username and password.  Masks are optional in the cancer centers. If you would like for your care team to wear a mask while they are taking care of you, please let them know. You may have one support person who is at least 61 years old accompany you for your appointments.

## 2022-10-13 NOTE — Telephone Encounter (Signed)
Ears - scaly and crusty skin. Brett [facility] was instructed to apply aquaphor on the dry ,scaly skin and neosporin  on any scabs that bleed.

## 2022-10-14 ENCOUNTER — Inpatient Hospital Stay: Payer: Medicare HMO

## 2022-10-14 ENCOUNTER — Other Ambulatory Visit: Payer: Self-pay

## 2022-10-14 ENCOUNTER — Telehealth: Payer: Self-pay | Admitting: Internal Medicine

## 2022-10-14 VITALS — BP 114/59 | HR 77 | Temp 97.5°F | Resp 18

## 2022-10-14 DIAGNOSIS — Z5112 Encounter for antineoplastic immunotherapy: Secondary | ICD-10-CM | POA: Diagnosis not present

## 2022-10-14 DIAGNOSIS — C7972 Secondary malignant neoplasm of left adrenal gland: Secondary | ICD-10-CM | POA: Diagnosis not present

## 2022-10-14 DIAGNOSIS — Z5111 Encounter for antineoplastic chemotherapy: Secondary | ICD-10-CM | POA: Diagnosis not present

## 2022-10-14 DIAGNOSIS — C3490 Malignant neoplasm of unspecified part of unspecified bronchus or lung: Secondary | ICD-10-CM

## 2022-10-14 DIAGNOSIS — Z79899 Other long term (current) drug therapy: Secondary | ICD-10-CM | POA: Diagnosis not present

## 2022-10-14 DIAGNOSIS — R634 Abnormal weight loss: Secondary | ICD-10-CM | POA: Diagnosis not present

## 2022-10-14 DIAGNOSIS — C7971 Secondary malignant neoplasm of right adrenal gland: Secondary | ICD-10-CM | POA: Diagnosis not present

## 2022-10-14 DIAGNOSIS — I10 Essential (primary) hypertension: Secondary | ICD-10-CM | POA: Diagnosis not present

## 2022-10-14 DIAGNOSIS — C3432 Malignant neoplasm of lower lobe, left bronchus or lung: Secondary | ICD-10-CM | POA: Diagnosis not present

## 2022-10-14 LAB — T4: T4, Total: 12.4 ug/dL — ABNORMAL HIGH (ref 4.5–12.0)

## 2022-10-14 MED ORDER — TRILACICLIB DIHYDROCHLORIDE INJECTION 300 MG
240.0000 mg/m2 | Freq: Once | INTRAVENOUS | Status: AC
  Administered 2022-10-14: 480 mg via INTRAVENOUS
  Filled 2022-10-14: qty 32

## 2022-10-14 MED ORDER — SODIUM CHLORIDE 0.9% FLUSH
10.0000 mL | INTRAVENOUS | Status: DC | PRN
  Administered 2022-10-14: 10 mL

## 2022-10-14 MED ORDER — SODIUM CHLORIDE 0.9 % IV SOLN: Freq: Once | INTRAVENOUS | Status: AC

## 2022-10-14 MED ORDER — SODIUM CHLORIDE 0.9 % IV SOLN
90.0000 mg/m2 | Freq: Once | INTRAVENOUS | Status: AC
  Administered 2022-10-14: 178 mg via INTRAVENOUS
  Filled 2022-10-14: qty 8.9

## 2022-10-14 MED ORDER — HEPARIN SOD (PORK) LOCK FLUSH 100 UNIT/ML IV SOLN
500.0000 [IU] | Freq: Once | INTRAVENOUS | Status: AC | PRN
  Administered 2022-10-14: 500 [IU]

## 2022-10-14 MED ORDER — SODIUM CHLORIDE 0.9 % IV SOLN
10.0000 mg | Freq: Once | INTRAVENOUS | Status: AC
  Administered 2022-10-14: 10 mg via INTRAVENOUS
  Filled 2022-10-14: qty 10

## 2022-10-14 NOTE — Patient Instructions (Signed)
[facility]  Discharge Instructions: Thank you for choosing [facility] to provide your oncology and hematology care.   If you have a lab appointment with the [facility], please go directly to the [facility] and check in at the registration area.   Wear comfortable clothing and clothing appropriate for easy access to any Portacath or PICC line.   We strive to give you quality time with your provider. You may need to reschedule your appointment if you arrive late (15 or more minutes).  Arriving late affects you and other patients whose appointments are after yours.  Also, if you miss three or more appointments without notifying the office, you may be dismissed from the clinic at the provider's discretion.      For prescription refill requests, have your pharmacy contact our office and allow 72 hours for refills to be completed.    Today you received the following chemotherapy and/or immunotherapy agents: Etoposide      To help prevent nausea and vomiting after your treatment, we encourage you to take your nausea medication as directed.  BELOW ARE SYMPTOMS THAT SHOULD BE REPORTED IMMEDIATELY: *FEVER GREATER THAN 100.4 F (38 C) OR HIGHER *CHILLS OR SWEATING *NAUSEA AND VOMITING THAT IS NOT CONTROLLED WITH YOUR NAUSEA MEDICATION *UNUSUAL SHORTNESS OF BREATH *UNUSUAL BRUISING OR BLEEDING *URINARY PROBLEMS (pain or burning when urinating, or frequent urination) *BOWEL PROBLEMS (unusual diarrhea, constipation, pain near the anus) TENDERNESS IN MOUTH AND THROAT WITH OR WITHOUT PRESENCE OF ULCERS (sore throat, sores in mouth, or a toothache) UNUSUAL RASH, SWELLING OR PAIN  UNUSUAL VAGINAL DISCHARGE OR ITCHING   Items with * indicate a potential emergency and should be followed up as soon as possible or go to the Emergency Department if any problems should occur.  Please show the CHEMOTHERAPY ALERT CARD or IMMUNOTHERAPY ALERT CARD at  check-in to the Emergency Department and triage nurse.  Should you have questions after your visit or need to cancel or reschedule your appointment, please contact [facility]  Dept: [phone]  and follow the prompts.  Office hours are 8:00 a.m. to 4:30 p.m. Monday - Friday. Please note that voicemails left after 4:00 p.m. may not be returned until the following business day.  We are closed weekends and major holidays. You have access to a nurse at all times for urgent questions. Please call the main number to the clinic Dept: [phone] and follow the prompts.   For any non-urgent questions, you may also contact your provider using MyChart. We now offer e-Visits for anyone 30 and older to request care online for non-urgent symptoms. For details visit [email or web address].   Also download the MyChart app! Go to the app store, search "MyChart", open the app, select [facility], and log in with your MyChart username and password.

## 2022-10-14 NOTE — Telephone Encounter (Signed)
Scheduled per 02/20 los, patient has been called and notified of all upcoming appointments.

## 2022-10-17 ENCOUNTER — Other Ambulatory Visit: Payer: Self-pay | Admitting: Internal Medicine

## 2022-10-20 ENCOUNTER — Other Ambulatory Visit: Payer: Self-pay

## 2022-10-20 ENCOUNTER — Inpatient Hospital Stay: Payer: Medicare HMO

## 2022-10-20 VITALS — BP 105/68

## 2022-10-20 DIAGNOSIS — Z95828 Presence of other vascular implants and grafts: Secondary | ICD-10-CM

## 2022-10-20 DIAGNOSIS — Z5112 Encounter for antineoplastic immunotherapy: Secondary | ICD-10-CM | POA: Diagnosis not present

## 2022-10-20 DIAGNOSIS — C7971 Secondary malignant neoplasm of right adrenal gland: Secondary | ICD-10-CM | POA: Diagnosis not present

## 2022-10-20 DIAGNOSIS — I10 Essential (primary) hypertension: Secondary | ICD-10-CM | POA: Diagnosis not present

## 2022-10-20 DIAGNOSIS — C3432 Malignant neoplasm of lower lobe, left bronchus or lung: Secondary | ICD-10-CM | POA: Diagnosis not present

## 2022-10-20 DIAGNOSIS — R634 Abnormal weight loss: Secondary | ICD-10-CM | POA: Diagnosis not present

## 2022-10-20 DIAGNOSIS — Z79899 Other long term (current) drug therapy: Secondary | ICD-10-CM | POA: Diagnosis not present

## 2022-10-20 DIAGNOSIS — C3490 Malignant neoplasm of unspecified part of unspecified bronchus or lung: Secondary | ICD-10-CM

## 2022-10-20 DIAGNOSIS — C7972 Secondary malignant neoplasm of left adrenal gland: Secondary | ICD-10-CM | POA: Diagnosis not present

## 2022-10-20 DIAGNOSIS — Z5111 Encounter for antineoplastic chemotherapy: Secondary | ICD-10-CM | POA: Diagnosis not present

## 2022-10-20 LAB — CBC WITH DIFFERENTIAL (CANCER CENTER ONLY)
Abs Immature Granulocytes: 0.06 10*3/uL (ref 0.00–0.07)
Basophils Absolute: 0.1 10*3/uL (ref 0.0–0.1)
Basophils Relative: 1 %
Eosinophils Absolute: 0.5 10*3/uL (ref 0.0–0.5)
Eosinophils Relative: 10 %
HCT: 33 % — ABNORMAL LOW (ref 39.0–52.0)
Hemoglobin: 11.6 g/dL — ABNORMAL LOW (ref 13.0–17.0)
Immature Granulocytes: 1 %
Lymphocytes Relative: 33 %
Lymphs Abs: 1.6 10*3/uL (ref 0.7–4.0)
MCH: 30.5 pg (ref 26.0–34.0)
MCHC: 35.2 g/dL (ref 30.0–36.0)
MCV: 86.8 fL (ref 80.0–100.0)
Monocytes Absolute: 0.1 10*3/uL (ref 0.1–1.0)
Monocytes Relative: 2 %
Neutro Abs: 2.6 10*3/uL (ref 1.7–7.7)
Neutrophils Relative %: 53 %
Platelet Count: 141 10*3/uL — ABNORMAL LOW (ref 150–400)
RBC: 3.8 MIL/uL — ABNORMAL LOW (ref 4.22–5.81)
RDW: 13.5 % (ref 11.5–15.5)
WBC Count: 5 10*3/uL (ref 4.0–10.5)
nRBC: 0 % (ref 0.0–0.2)

## 2022-10-20 LAB — CMP (CANCER CENTER ONLY)
ALT: 23 U/L (ref 0–44)
AST: 20 U/L (ref 15–41)
Albumin: 4 g/dL (ref 3.5–5.0)
Alkaline Phosphatase: 57 U/L (ref 38–126)
Anion gap: 7 (ref 5–15)
BUN: 20 mg/dL (ref 6–20)
CO2: 30 mmol/L (ref 22–32)
Calcium: 8.1 mg/dL — ABNORMAL LOW (ref 8.9–10.3)
Chloride: 101 mmol/L (ref 98–111)
Creatinine: 1.06 mg/dL (ref 0.61–1.24)
GFR, Estimated: >60 mL/min (ref >60)
Glucose, Bld: 111 mg/dL — ABNORMAL HIGH (ref 70–99)
Potassium: 3.9 mmol/L (ref 3.5–5.1)
Sodium: 138 mmol/L (ref 135–145)
Total Bilirubin: 0.4 mg/dL (ref 0.3–1.2)
Total Protein: 7.4 g/dL (ref 6.5–8.1)

## 2022-10-20 MED ORDER — SODIUM CHLORIDE 0.9% FLUSH
10.0000 mL | Freq: Once | INTRAVENOUS | Status: AC | PRN
  Administered 2022-10-20: 10 mL

## 2022-10-20 MED ORDER — HEPARIN SOD (PORK) LOCK FLUSH 100 UNIT/ML IV SOLN
500.0000 [IU] | Freq: Once | INTRAVENOUS | Status: AC | PRN
  Administered 2022-10-20: 500 [IU]

## 2022-10-23 ENCOUNTER — Other Ambulatory Visit: Payer: Self-pay

## 2022-10-27 ENCOUNTER — Inpatient Hospital Stay: Payer: Medicare HMO | Attending: Physician Assistant

## 2022-10-27 ENCOUNTER — Inpatient Hospital Stay: Payer: Medicare HMO

## 2022-10-27 ENCOUNTER — Other Ambulatory Visit: Payer: Self-pay

## 2022-10-27 VITALS — BP 116/70

## 2022-10-27 DIAGNOSIS — Z5112 Encounter for antineoplastic immunotherapy: Secondary | ICD-10-CM | POA: Diagnosis not present

## 2022-10-27 DIAGNOSIS — C3432 Malignant neoplasm of lower lobe, left bronchus or lung: Secondary | ICD-10-CM | POA: Insufficient documentation

## 2022-10-27 DIAGNOSIS — Z5111 Encounter for antineoplastic chemotherapy: Secondary | ICD-10-CM | POA: Diagnosis not present

## 2022-10-27 DIAGNOSIS — C7972 Secondary malignant neoplasm of left adrenal gland: Secondary | ICD-10-CM | POA: Diagnosis not present

## 2022-10-27 DIAGNOSIS — C7971 Secondary malignant neoplasm of right adrenal gland: Secondary | ICD-10-CM | POA: Diagnosis not present

## 2022-10-27 DIAGNOSIS — C7931 Secondary malignant neoplasm of brain: Secondary | ICD-10-CM | POA: Insufficient documentation

## 2022-10-27 DIAGNOSIS — C3490 Malignant neoplasm of unspecified part of unspecified bronchus or lung: Secondary | ICD-10-CM

## 2022-10-27 DIAGNOSIS — I10 Essential (primary) hypertension: Secondary | ICD-10-CM | POA: Diagnosis not present

## 2022-10-27 DIAGNOSIS — Z95828 Presence of other vascular implants and grafts: Secondary | ICD-10-CM

## 2022-10-27 LAB — CBC WITH DIFFERENTIAL (CANCER CENTER ONLY)
Abs Immature Granulocytes: 0.01 10*3/uL (ref 0.00–0.07)
Basophils Absolute: 0.1 10*3/uL (ref 0.0–0.1)
Basophils Relative: 1 %
Eosinophils Absolute: 0.2 10*3/uL (ref 0.0–0.5)
Eosinophils Relative: 4 %
HCT: 31.5 % — ABNORMAL LOW (ref 39.0–52.0)
Hemoglobin: 11.1 g/dL — ABNORMAL LOW (ref 13.0–17.0)
Immature Granulocytes: 0 %
Lymphocytes Relative: 42 %
Lymphs Abs: 1.5 10*3/uL (ref 0.7–4.0)
MCH: 30.8 pg (ref 26.0–34.0)
MCHC: 35.2 g/dL (ref 30.0–36.0)
MCV: 87.5 fL (ref 80.0–100.0)
Monocytes Absolute: 0.6 10*3/uL (ref 0.1–1.0)
Monocytes Relative: 16 %
Neutro Abs: 1.4 10*3/uL — ABNORMAL LOW (ref 1.7–7.7)
Neutrophils Relative %: 37 %
Platelet Count: 120 10*3/uL — ABNORMAL LOW (ref 150–400)
RBC: 3.6 MIL/uL — ABNORMAL LOW (ref 4.22–5.81)
RDW: 14.4 % (ref 11.5–15.5)
WBC Count: 3.7 10*3/uL — ABNORMAL LOW (ref 4.0–10.5)
nRBC: 0 % (ref 0.0–0.2)

## 2022-10-27 LAB — CMP (CANCER CENTER ONLY)
ALT: 18 U/L (ref 0–44)
AST: 17 U/L (ref 15–41)
Albumin: 3.9 g/dL (ref 3.5–5.0)
Alkaline Phosphatase: 62 U/L (ref 38–126)
Anion gap: 7 (ref 5–15)
BUN: 11 mg/dL (ref 6–20)
CO2: 29 mmol/L (ref 22–32)
Calcium: 7.5 mg/dL — ABNORMAL LOW (ref 8.9–10.3)
Chloride: 101 mmol/L (ref 98–111)
Creatinine: 1.02 mg/dL (ref 0.61–1.24)
GFR, Estimated: >60 mL/min (ref >60)
Glucose, Bld: 111 mg/dL — ABNORMAL HIGH (ref 70–99)
Potassium: 4.1 mmol/L (ref 3.5–5.1)
Sodium: 137 mmol/L (ref 135–145)
Total Bilirubin: 0.3 mg/dL (ref 0.3–1.2)
Total Protein: 7.1 g/dL (ref 6.5–8.1)

## 2022-10-27 MED ORDER — HEPARIN SOD (PORK) LOCK FLUSH 100 UNIT/ML IV SOLN
500.0000 [IU] | Freq: Once | INTRAVENOUS | Status: AC
  Administered 2022-10-27: 500 [IU]

## 2022-10-27 MED ORDER — SODIUM CHLORIDE 0.9% FLUSH
10.0000 mL | Freq: Once | INTRAVENOUS | Status: AC
  Administered 2022-10-27: 10 mL

## 2022-10-30 ENCOUNTER — Ambulatory Visit: Payer: Medicare HMO | Admitting: Internal Medicine

## 2022-10-30 MED FILL — Fosaprepitant Dimeglumine For IV Infusion 150 MG (Base Eq): INTRAVENOUS | Qty: 5 | Status: AC

## 2022-10-30 MED FILL — Dexamethasone Sodium Phosphate Inj 100 MG/10ML: INTRAMUSCULAR | Qty: 1 | Status: AC

## 2022-11-02 ENCOUNTER — Inpatient Hospital Stay (HOSPITAL_BASED_OUTPATIENT_CLINIC_OR_DEPARTMENT_OTHER): Payer: Medicare HMO | Admitting: Internal Medicine

## 2022-11-02 ENCOUNTER — Inpatient Hospital Stay: Payer: Medicare HMO

## 2022-11-02 ENCOUNTER — Other Ambulatory Visit: Payer: Medicare HMO

## 2022-11-02 VITALS — BP 104/60 | HR 88 | Temp 98.2°F | Resp 16

## 2022-11-02 VITALS — BP 120/75 | HR 82 | Temp 97.6°F | Resp 15 | Wt 159.0 lb

## 2022-11-02 DIAGNOSIS — C3432 Malignant neoplasm of lower lobe, left bronchus or lung: Secondary | ICD-10-CM | POA: Diagnosis not present

## 2022-11-02 DIAGNOSIS — Z95828 Presence of other vascular implants and grafts: Secondary | ICD-10-CM

## 2022-11-02 DIAGNOSIS — C3490 Malignant neoplasm of unspecified part of unspecified bronchus or lung: Secondary | ICD-10-CM

## 2022-11-02 DIAGNOSIS — C7971 Secondary malignant neoplasm of right adrenal gland: Secondary | ICD-10-CM | POA: Diagnosis not present

## 2022-11-02 DIAGNOSIS — I10 Essential (primary) hypertension: Secondary | ICD-10-CM | POA: Diagnosis not present

## 2022-11-02 DIAGNOSIS — C7931 Secondary malignant neoplasm of brain: Secondary | ICD-10-CM | POA: Diagnosis not present

## 2022-11-02 DIAGNOSIS — Z5112 Encounter for antineoplastic immunotherapy: Secondary | ICD-10-CM | POA: Diagnosis not present

## 2022-11-02 DIAGNOSIS — C7972 Secondary malignant neoplasm of left adrenal gland: Secondary | ICD-10-CM | POA: Diagnosis not present

## 2022-11-02 DIAGNOSIS — Z5111 Encounter for antineoplastic chemotherapy: Secondary | ICD-10-CM | POA: Diagnosis not present

## 2022-11-02 LAB — CBC WITH DIFFERENTIAL (CANCER CENTER ONLY)
Abs Immature Granulocytes: 0.04 10*3/uL (ref 0.00–0.07)
Basophils Absolute: 0.1 10*3/uL (ref 0.0–0.1)
Basophils Relative: 1 %
Eosinophils Absolute: 0.1 10*3/uL (ref 0.0–0.5)
Eosinophils Relative: 4 %
HCT: 35.1 % — ABNORMAL LOW (ref 39.0–52.0)
Hemoglobin: 12.4 g/dL — ABNORMAL LOW (ref 13.0–17.0)
Immature Granulocytes: 1 %
Lymphocytes Relative: 40 %
Lymphs Abs: 1.4 10*3/uL (ref 0.7–4.0)
MCH: 31.3 pg (ref 26.0–34.0)
MCHC: 35.3 g/dL (ref 30.0–36.0)
MCV: 88.6 fL (ref 80.0–100.0)
Monocytes Absolute: 0.8 10*3/uL (ref 0.1–1.0)
Monocytes Relative: 22 %
Neutro Abs: 1.2 10*3/uL — ABNORMAL LOW (ref 1.7–7.7)
Neutrophils Relative %: 32 %
Platelet Count: 228 10*3/uL (ref 150–400)
RBC: 3.96 MIL/uL — ABNORMAL LOW (ref 4.22–5.81)
RDW: 14.5 % (ref 11.5–15.5)
WBC Count: 3.6 10*3/uL — ABNORMAL LOW (ref 4.0–10.5)
nRBC: 0 % (ref 0.0–0.2)

## 2022-11-02 LAB — CMP (CANCER CENTER ONLY)
ALT: 23 U/L (ref 0–44)
AST: 23 U/L (ref 15–41)
Albumin: 4.3 g/dL (ref 3.5–5.0)
Alkaline Phosphatase: 64 U/L (ref 38–126)
Anion gap: 9 (ref 5–15)
BUN: 14 mg/dL (ref 6–20)
CO2: 27 mmol/L (ref 22–32)
Calcium: 8.2 mg/dL — ABNORMAL LOW (ref 8.9–10.3)
Chloride: 102 mmol/L (ref 98–111)
Creatinine: 1.11 mg/dL (ref 0.61–1.24)
GFR, Estimated: >60 mL/min (ref >60)
Glucose, Bld: 86 mg/dL (ref 70–99)
Potassium: 4 mmol/L (ref 3.5–5.1)
Sodium: 138 mmol/L (ref 135–145)
Total Bilirubin: 0.4 mg/dL (ref 0.3–1.2)
Total Protein: 7.6 g/dL (ref 6.5–8.1)

## 2022-11-02 MED ORDER — SODIUM CHLORIDE 0.9 % IV SOLN
332.4000 mg | Freq: Once | INTRAVENOUS | Status: AC
  Administered 2022-11-02: 330 mg via INTRAVENOUS
  Filled 2022-11-02: qty 33

## 2022-11-02 MED ORDER — SODIUM CHLORIDE 0.9% FLUSH
10.0000 mL | INTRAVENOUS | Status: DC | PRN
  Administered 2022-11-02: 10 mL

## 2022-11-02 MED ORDER — SODIUM CHLORIDE 0.9 % IV SOLN
90.0000 mg/m2 | Freq: Once | INTRAVENOUS | Status: AC
  Administered 2022-11-02: 178 mg via INTRAVENOUS
  Filled 2022-11-02: qty 8.9

## 2022-11-02 MED ORDER — HEPARIN SOD (PORK) LOCK FLUSH 100 UNIT/ML IV SOLN
500.0000 [IU] | Freq: Once | INTRAVENOUS | Status: AC | PRN
  Administered 2022-11-02: 500 [IU]

## 2022-11-02 MED ORDER — SODIUM CHLORIDE 0.9 % IV SOLN
10.0000 mg | Freq: Once | INTRAVENOUS | Status: AC
  Administered 2022-11-02: 10 mg via INTRAVENOUS
  Filled 2022-11-02: qty 10

## 2022-11-02 MED ORDER — SODIUM CHLORIDE 0.9% FLUSH
10.0000 mL | Freq: Once | INTRAVENOUS | Status: AC
  Administered 2022-11-02: 10 mL

## 2022-11-02 MED ORDER — SODIUM CHLORIDE 0.9 % IV SOLN
1500.0000 mg | Freq: Once | INTRAVENOUS | Status: AC
  Administered 2022-11-02: 1500 mg via INTRAVENOUS
  Filled 2022-11-02: qty 30

## 2022-11-02 MED ORDER — SODIUM CHLORIDE 0.9 % IV SOLN: Freq: Once | INTRAVENOUS | Status: AC

## 2022-11-02 MED ORDER — PALONOSETRON HCL INJECTION 0.25 MG/5ML
0.2500 mg | Freq: Once | INTRAVENOUS | Status: AC
  Administered 2022-11-02: 0.25 mg via INTRAVENOUS
  Filled 2022-11-02: qty 5

## 2022-11-02 MED ORDER — TRILACICLIB DIHYDROCHLORIDE INJECTION 300 MG
240.0000 mg/m2 | Freq: Once | INTRAVENOUS | Status: AC
  Administered 2022-11-02: 480 mg via INTRAVENOUS
  Filled 2022-11-02: qty 32

## 2022-11-02 MED ORDER — SODIUM CHLORIDE 0.9 % IV SOLN
150.0000 mg | Freq: Once | INTRAVENOUS | Status: AC
  Administered 2022-11-02: 150 mg via INTRAVENOUS
  Filled 2022-11-02: qty 150

## 2022-11-02 MED FILL — Dexamethasone Sodium Phosphate Inj 100 MG/10ML: INTRAMUSCULAR | Qty: 1 | Status: AC

## 2022-11-02 NOTE — Progress Notes (Signed)
Per Dr. Julien Nordmann, [place] to proceed with treatment with [place] 1.2.

## 2022-11-02 NOTE — Progress Notes (Signed)
[facility] [phone]   [phone]  OFFICE PROGRESS NOTE  [name], NP [place] [address]  DIAGNOSIS: Extensive stage (T4, N3, M1 C) small cell lung cancer presented with obstructing left lower lobe lung mass with left extrapleural lymph node, bilateral adrenal metastasis, pancreatic lesion as well as upper abdominal lymphadenopathy and soft tissue nodules in the abdominal wall as well as the abdomen diagnosed in December 2023.  PRIOR THERAPY: Whole brain irradiation for brain metastasis.  CURRENT THERAPY: Systemic chemotherapy with carboplatin for AUC of 5 on day 1, etoposide 100 Mg/M2 on days 1, 2 and 3 with Cosela 240 Mg/M2 on the days of the chemotherapy and Imfinzi 1500 Mg IV every 3 weeks.  First dose August 10, 2022.  Status post 3 cycles.   INTERVAL HISTORY: Brett Campbell 62 y.o. male returns to the clinic today for follow-up visit accompanied by his sister as well as the sign interpreter.  The patient is feeling fine today with no concerning complaints except for an episode of nausea earlier today.  He also has occasional diarrhea up to 2-3 times a day.  He denied having any chest pain, shortness of breath, cough or hemoptysis.  He has no fever or chills.  He denied having any significant weight loss or night sweats.  He tolerated the whole brain irradiation fairly well except for itching in his ears.  The patient is here today for evaluation before starting cycle #4 of his chemotherapy.   MEDICAL HISTORY: Past Medical History:  Diagnosis Date   Calcium deficiency    Cataract    COPD (chronic obstructive pulmonary disease) ([facility])    moderate emphysema   Deaf    Hypertension    Hypothyroidism    Personal history of colonic polyps 11/03/2020   Pneumonia    August 2023   Scarlet fever    Sleep apnea    never used a Cpap, used to use O2   Thyroid disease    Tuberculosis    [facility]  department is contact with him due to hx.    ALLERGIES:  is allergic to penicillins.  MEDICATIONS:  Current Outpatient Medications  Medication Sig Dispense Refill   amLODipine (NORVASC) 5 MG tablet Take 1 tablet (5 mg total) by mouth daily. 90 tablet 3   atorvastatin (LIPITOR) 10 MG tablet TAKE 1 TABLET EVERY DAY 90 tablet 3   calcitRIOL (ROCALTROL) 0.25 MCG capsule Take 1 capsule (0.25 mcg total) by mouth daily. 90 capsule 3   calcium carbonate (TUMS) 500 MG chewable tablet Chew 1 tablet (200 mg of elemental calcium total) by mouth 3 (three) times daily. 90 tablet 3   colestipol (COLESTID) 1 g tablet Take 1 tablet (1 g total) by mouth 2 (two) times daily. (Patient taking differently: Take 1 g by mouth daily.) 60 tablet 5   guaifenesin (MUCUS RELIEF CHEST CONGESTION) 400 MG TABS tablet Take 400 mg by mouth every 4 (four) hours as needed (cough).     HYDROcodone bit-homatropine (HYCODAN) 5-1.5 MG/5ML syrup Take 5 mLs by mouth every 6 hours as needed for cough. 240 mL 0   ibuprofen (ADVIL) 200 MG tablet Take 400-800 mg by mouth every 6 (six) hours as needed for moderate pain.     levothyroxine (SYNTHROID) 150 MCG tablet TAKE 1 TABLET EVERY DAY 90 tablet 0   lidocaine-prilocaine (EMLA) cream Apply to affected area once 30 g 3  loperamide (IMODIUM A-D) 2 MG tablet Take 1 tablet (2 mg total) by mouth 4 (four) times daily as needed for diarrhea or loose stools. 30 tablet 0   ondansetron (ZOFRAN) 8 MG tablet Take 1 tablet (8 mg total) by mouth every 8 (eight) hours as needed for nausea, vomiting or refractory nausea / vomiting. Start on the third day after carboplatin. (Patient taking differently: Take 8 mg by mouth every 6 (six) hours as needed for nausea, vomiting or refractory nausea / vomiting. Start on the third day after carboplatin.) 30 tablet 1   potassium chloride SA (KLOR-CON M) 20 MEQ tablet TAKE 1 TABLET EVERY DAY 90 tablet 3   prochlorperazine (COMPAZINE) 10 MG tablet Take 1 tablet (10 mg  total) by mouth every 6 (six) hours as needed. (Patient taking differently: Take 10 mg by mouth every 6 (six) hours as needed for nausea or vomiting.) 30 tablet 2   No current facility-administered medications for this visit.    SURGICAL HISTORY:  Past Surgical History:  Procedure Laterality Date   CHOLECYSTECTOMY     COLONOSCOPY     +5years   IR IMAGING GUIDED PORT INSERTION  08/14/2022   LAPAROSCOPIC RIGHT HEMI COLECTOMY N/A 02/26/2021   Procedure: LAPAROSCOPIC RIGHT HEMI COLECTOMY;  Surgeon: Ileana Roup, MD;  Location: [facility];  Service: General;  Laterality: N/A;   THYROIDECTOMY     VIDEO BRONCHOSCOPY WITH ENDOBRONCHIAL ULTRASOUND N/A 07/24/2022   Procedure: VIDEO BRONCHOSCOPY WITH ENDOBRONCHIAL ULTRASOUND  WITH LEFT LOWER LOBE  MASS BIOPSIES;  Surgeon: [name], MD;  Location: [facility];  Service: Pulmonary;  Laterality: N/A;    REVIEW OF SYSTEMS:  A comprehensive review of systems was negative except for: Constitutional: positive for fatigue Ears, nose, mouth, throat, and face: positive for earaches Gastrointestinal: positive for diarrhea and nausea   PHYSICAL EXAMINATION: General appearance: alert, cooperative, and fatigued Head: Normocephalic, without obvious abnormality, atraumatic Neck: no adenopathy, no JVD, supple, symmetrical, trachea midline, and thyroid not enlarged, symmetric, no tenderness/mass/nodules Lymph nodes: Cervical, supraclavicular, and axillary nodes normal. Resp: clear to auscultation bilaterally Back: symmetric, no curvature. ROM normal. No CVA tenderness. Cardio: regular rate and rhythm, S1, S2 normal, no murmur, click, rub or gallop GI: soft, non-tender; bowel sounds normal; no masses,  no organomegaly Extremities: extremities normal, atraumatic, no cyanosis or edema  ECOG PERFORMANCE STATUS: 1 - Symptomatic but completely ambulatory  Blood pressure 120/75, pulse 82, temperature 97.6 F (36.4 C), temperature source Oral, resp. rate 15,  weight 159 lb (72.1 kg), SpO2 99 %.  LABORATORY DATA: Lab Results  Component Value Date   WBC 3.6 (L) 11/02/2022   HGB 12.4 (L) 11/02/2022   HCT 35.1 (L) 11/02/2022   MCV 88.6 11/02/2022   PLT 228 11/02/2022      Chemistry      Component Value Date/Time   NA 137 10/27/2022 1241   NA 139 04/28/2022 1038   K 4.1 10/27/2022 1241   CL 101 10/27/2022 1241   CO2 29 10/27/2022 1241   BUN 11 10/27/2022 1241   BUN 14 04/28/2022 1038   CREATININE 1.02 10/27/2022 1241      Component Value Date/Time   CALCIUM 7.5 (L) 10/27/2022 1241   ALKPHOS 62 10/27/2022 1241   AST 17 10/27/2022 1241   ALT 18 10/27/2022 1241   BILITOT 0.3 10/27/2022 1241       RADIOGRAPHIC STUDIES: No results found.  ASSESSMENT AND PLAN: This is a very pleasant 62 years old white male with  extensive stage (T4, N3, M1 C) small cell lung cancer presented with obstructing left lower lobe lung mass with left extrapleural lymph node, bilateral adrenal metastasis, pancreatic lesion as well as upper abdominal lymphadenopathy and soft tissue nodules in the abdominal wall as well as the abdomen diagnosed in December 2023. The patient started the first cycle of his systemic chemotherapy with carboplatin for AUC of 5 on day 1 and etoposide 100 Mg/M2 on days 1, 2 and 3 with Cosela before the chemotherapy and Imfinzi 1500 Mg IV on day 1 every 3 weeks.  Status post 3 cycle given on August 10, 2022. The patient has been tolerating this treatment fairly well with no concerning adverse effects. He also underwent whole brain irradiation. I recommended for him to proceed with cycle #4 today as planned. I will see him back for follow-up visit in 3 weeks for evaluation with repeat CT scan of the chest, abdomen and pelvis for restaging of his disease. For the lack of appetite and weight loss, encouraged the patient to increase his oral intake with nutrition supplements. The patient was advised to call immediately if he has any  concerning symptoms in the interval. The patient voices understanding of current disease status and treatment options and is in agreement with the current care plan.  All questions were answered. The patient knows to call the clinic with any problems, questions or concerns. We can certainly see the patient much sooner if necessary.  The total time spent in the appointment was 20 minutes.  Disclaimer: This note was dictated with voice recognition software. Similar sounding words can inadvertently be transcribed and may not be corrected upon review.

## 2022-11-02 NOTE — Progress Notes (Signed)
Keep Carboplatin dosed at 330 mg per Dr. Julien Nordmann.  Pts ANC is low.  Kennith Center, Pharm.D., CPP 11/02/2022'@10'$ :56 AM

## 2022-11-03 ENCOUNTER — Other Ambulatory Visit: Payer: Self-pay

## 2022-11-03 ENCOUNTER — Inpatient Hospital Stay: Payer: Medicare HMO

## 2022-11-03 VITALS — BP 106/63 | HR 69 | Temp 98.1°F | Resp 16

## 2022-11-03 DIAGNOSIS — Z5111 Encounter for antineoplastic chemotherapy: Secondary | ICD-10-CM | POA: Diagnosis not present

## 2022-11-03 DIAGNOSIS — C7971 Secondary malignant neoplasm of right adrenal gland: Secondary | ICD-10-CM | POA: Diagnosis not present

## 2022-11-03 DIAGNOSIS — Z5112 Encounter for antineoplastic immunotherapy: Secondary | ICD-10-CM | POA: Diagnosis not present

## 2022-11-03 DIAGNOSIS — C3490 Malignant neoplasm of unspecified part of unspecified bronchus or lung: Secondary | ICD-10-CM

## 2022-11-03 DIAGNOSIS — C7931 Secondary malignant neoplasm of brain: Secondary | ICD-10-CM | POA: Diagnosis not present

## 2022-11-03 DIAGNOSIS — C3432 Malignant neoplasm of lower lobe, left bronchus or lung: Secondary | ICD-10-CM | POA: Diagnosis not present

## 2022-11-03 DIAGNOSIS — I10 Essential (primary) hypertension: Secondary | ICD-10-CM | POA: Diagnosis not present

## 2022-11-03 DIAGNOSIS — C7972 Secondary malignant neoplasm of left adrenal gland: Secondary | ICD-10-CM | POA: Diagnosis not present

## 2022-11-03 MED ORDER — SODIUM CHLORIDE 0.9 % IV SOLN
90.0000 mg/m2 | Freq: Once | INTRAVENOUS | Status: AC
  Administered 2022-11-03: 178 mg via INTRAVENOUS
  Filled 2022-11-03: qty 8.9

## 2022-11-03 MED ORDER — SODIUM CHLORIDE 0.9 % IV SOLN
10.0000 mg | Freq: Once | INTRAVENOUS | Status: AC
  Administered 2022-11-03: 10 mg via INTRAVENOUS
  Filled 2022-11-03: qty 10

## 2022-11-03 MED ORDER — TRILACICLIB DIHYDROCHLORIDE INJECTION 300 MG
240.0000 mg/m2 | Freq: Once | INTRAVENOUS | Status: AC
  Administered 2022-11-03: 480 mg via INTRAVENOUS
  Filled 2022-11-03: qty 32

## 2022-11-03 MED ORDER — HEPARIN SOD (PORK) LOCK FLUSH 100 UNIT/ML IV SOLN
500.0000 [IU] | Freq: Once | INTRAVENOUS | Status: AC | PRN
  Administered 2022-11-03: 500 [IU]

## 2022-11-03 MED ORDER — SODIUM CHLORIDE 0.9 % IV SOLN: Freq: Once | INTRAVENOUS | Status: AC

## 2022-11-03 MED ORDER — SODIUM CHLORIDE 0.9% FLUSH
10.0000 mL | INTRAVENOUS | Status: DC | PRN
  Administered 2022-11-03: 10 mL

## 2022-11-03 MED FILL — Dexamethasone Sodium Phosphate Inj 100 MG/10ML: INTRAMUSCULAR | Qty: 1 | Status: AC

## 2022-11-03 NOTE — Patient Instructions (Signed)
Brett Campbell   Discharge Instructions: Thank you for choosing [facility] to provide your oncology and hematology care.   If you have a lab appointment with the Cancer Center, please go directly to the Cancer Center and check in at the registration area.   Wear comfortable clothing and clothing appropriate for easy access to any Portacath or PICC line.   We strive to give you quality time with your provider. You may need to reschedule your appointment if you arrive late (15 or more minutes).  Arriving late affects you and other patients whose appointments are after yours.  Also, if you miss three or more appointments without notifying the office, you may be dismissed from the clinic at the provider's discretion.      For prescription refill requests, have your pharmacy contact our office and allow 72 hours for refills to be completed.    Today you received the following chemotherapy and/or immunotherapy agents: Etoposide, Cosela      To help prevent nausea and vomiting after your treatment, we encourage you to take your nausea medication as directed.  BELOW ARE SYMPTOMS THAT SHOULD BE REPORTED IMMEDIATELY: *FEVER GREATER THAN 100.4 F (38 C) OR HIGHER *CHILLS OR SWEATING *NAUSEA AND VOMITING THAT IS NOT CONTROLLED WITH YOUR NAUSEA MEDICATION *UNUSUAL SHORTNESS OF BREATH *UNUSUAL BRUISING OR BLEEDING *URINARY PROBLEMS (pain or burning when urinating, or frequent urination) *BOWEL PROBLEMS (unusual diarrhea, constipation, pain near the anus) TENDERNESS IN MOUTH AND THROAT WITH OR WITHOUT PRESENCE OF ULCERS (sore throat, sores in mouth, or a toothache) UNUSUAL RASH, SWELLING OR PAIN  UNUSUAL VAGINAL DISCHARGE OR ITCHING   Items with * indicate a potential emergency and should be followed up as soon as possible or go to the Emergency Department if any problems should occur.  Please show the CHEMOTHERAPY ALERT CARD or IMMUNOTHERAPY ALERT  CARD at check-in to the Emergency Department and triage nurse.  Should you have questions after your visit or need to cancel or reschedule your appointment, please contact [facility]  Dept: [phone]  and follow the prompts.  Office hours are 8:00 a.m. to 4:30 p.m. Monday - Friday. Please note that voicemails left after 4:00 p.m. may not be returned until the following business day.  We are closed weekends and major holidays. You have access to a nurse at all times for urgent questions. Please call the main number to the clinic Dept: [phone] and follow the prompts.   For any non-urgent questions, you may also contact your provider using MyChart. We now offer e-Visits for anyone 18 and older to request care online for non-urgent symptoms. For details visit [email or web address].   Also download the MyChart app! Go to the app store, search "MyChart", open the app, select , and log in with your MyChart username and password.  

## 2022-11-04 ENCOUNTER — Inpatient Hospital Stay: Payer: Medicare HMO

## 2022-11-04 ENCOUNTER — Other Ambulatory Visit: Payer: Self-pay

## 2022-11-04 VITALS — BP 110/66 | HR 60 | Temp 97.8°F | Resp 16

## 2022-11-04 DIAGNOSIS — C3432 Malignant neoplasm of lower lobe, left bronchus or lung: Secondary | ICD-10-CM | POA: Diagnosis not present

## 2022-11-04 DIAGNOSIS — Z5111 Encounter for antineoplastic chemotherapy: Secondary | ICD-10-CM | POA: Diagnosis not present

## 2022-11-04 DIAGNOSIS — I10 Essential (primary) hypertension: Secondary | ICD-10-CM | POA: Diagnosis not present

## 2022-11-04 DIAGNOSIS — C3490 Malignant neoplasm of unspecified part of unspecified bronchus or lung: Secondary | ICD-10-CM

## 2022-11-04 DIAGNOSIS — C7931 Secondary malignant neoplasm of brain: Secondary | ICD-10-CM | POA: Diagnosis not present

## 2022-11-04 DIAGNOSIS — C7972 Secondary malignant neoplasm of left adrenal gland: Secondary | ICD-10-CM | POA: Diagnosis not present

## 2022-11-04 DIAGNOSIS — C7971 Secondary malignant neoplasm of right adrenal gland: Secondary | ICD-10-CM | POA: Diagnosis not present

## 2022-11-04 DIAGNOSIS — Z5112 Encounter for antineoplastic immunotherapy: Secondary | ICD-10-CM | POA: Diagnosis not present

## 2022-11-04 MED ORDER — SODIUM CHLORIDE 0.9% FLUSH
10.0000 mL | INTRAVENOUS | Status: DC | PRN
  Administered 2022-11-04: 10 mL

## 2022-11-04 MED ORDER — SODIUM CHLORIDE 0.9 % IV SOLN
10.0000 mg | Freq: Once | INTRAVENOUS | Status: AC
  Administered 2022-11-04: 10 mg via INTRAVENOUS
  Filled 2022-11-04: qty 10

## 2022-11-04 MED ORDER — TRILACICLIB DIHYDROCHLORIDE INJECTION 300 MG
240.0000 mg/m2 | Freq: Once | INTRAVENOUS | Status: AC
  Administered 2022-11-04: 480 mg via INTRAVENOUS
  Filled 2022-11-04: qty 32

## 2022-11-04 MED ORDER — HEPARIN SOD (PORK) LOCK FLUSH 100 UNIT/ML IV SOLN
500.0000 [IU] | Freq: Once | INTRAVENOUS | Status: AC | PRN
  Administered 2022-11-04: 500 [IU]

## 2022-11-04 MED ORDER — SODIUM CHLORIDE 0.9 % IV SOLN: Freq: Once | INTRAVENOUS | Status: AC

## 2022-11-04 MED ORDER — SODIUM CHLORIDE 0.9 % IV SOLN
90.0000 mg/m2 | Freq: Once | INTRAVENOUS | Status: AC
  Administered 2022-11-04: 178 mg via INTRAVENOUS
  Filled 2022-11-04: qty 8.9

## 2022-11-04 NOTE — Patient Instructions (Signed)
[facility]  Discharge Instructions: Thank you for choosing [facility] to provide your oncology and hematology care.   If you have a lab appointment with the [facility], please go directly to the [facility] and check in at the registration area.   Wear comfortable clothing and clothing appropriate for easy access to any Portacath or PICC line.   We strive to give you quality time with your provider. You may need to reschedule your appointment if you arrive late (15 or more minutes).  Arriving late affects you and other patients whose appointments are after yours.  Also, if you miss three or more appointments without notifying the office, you may be dismissed from the clinic at the provider's discretion.      For prescription refill requests, have your pharmacy contact our office and allow 72 hours for refills to be completed.    Today you received the following chemotherapy and/or immunotherapy agents cosela etoposide      To help prevent nausea and vomiting after your treatment, we encourage you to take your nausea medication as directed.  BELOW ARE SYMPTOMS THAT SHOULD BE REPORTED IMMEDIATELY: *FEVER GREATER THAN 100.4 F (38 C) OR HIGHER *CHILLS OR SWEATING *NAUSEA AND VOMITING THAT IS NOT CONTROLLED WITH YOUR NAUSEA MEDICATION *UNUSUAL SHORTNESS OF BREATH *UNUSUAL BRUISING OR BLEEDING *URINARY PROBLEMS (pain or burning when urinating, or frequent urination) *BOWEL PROBLEMS (unusual diarrhea, constipation, pain near the anus) TENDERNESS IN MOUTH AND THROAT WITH OR WITHOUT PRESENCE OF ULCERS (sore throat, sores in mouth, or a toothache) UNUSUAL RASH, SWELLING OR PAIN  UNUSUAL VAGINAL DISCHARGE OR ITCHING   Items with * indicate a potential emergency and should be followed up as soon as possible or go to the Emergency Department if any problems should occur.  Please show the CHEMOTHERAPY ALERT CARD or IMMUNOTHERAPY ALERT CARD at  check-in to the Emergency Department and triage nurse.  Should you have questions after your visit or need to cancel or reschedule your appointment, please contact [facility]  Dept: [phone]  and follow the prompts.  Office hours are 8:00 a.m. to 4:30 p.m. Monday - Friday. Please note that voicemails left after 4:00 p.m. may not be returned until the following business day.  We are closed weekends and major holidays. You have access to a nurse at all times for urgent questions. Please call the main number to the clinic Dept: [phone] and follow the prompts.   For any non-urgent questions, you may also contact your provider using MyChart. We now offer e-Visits for anyone 86 and older to request care online for non-urgent symptoms. For details visit [email or web address].   Also download the MyChart app! Go to the app store, search "MyChart", open the app, select [facility], and log in with your MyChart username and password.

## 2022-11-10 ENCOUNTER — Inpatient Hospital Stay: Payer: Medicare HMO

## 2022-11-10 DIAGNOSIS — Z5111 Encounter for antineoplastic chemotherapy: Secondary | ICD-10-CM | POA: Diagnosis not present

## 2022-11-10 DIAGNOSIS — Z5112 Encounter for antineoplastic immunotherapy: Secondary | ICD-10-CM | POA: Diagnosis not present

## 2022-11-10 DIAGNOSIS — C7972 Secondary malignant neoplasm of left adrenal gland: Secondary | ICD-10-CM | POA: Diagnosis not present

## 2022-11-10 DIAGNOSIS — C7931 Secondary malignant neoplasm of brain: Secondary | ICD-10-CM | POA: Diagnosis not present

## 2022-11-10 DIAGNOSIS — Z95828 Presence of other vascular implants and grafts: Secondary | ICD-10-CM

## 2022-11-10 DIAGNOSIS — I10 Essential (primary) hypertension: Secondary | ICD-10-CM | POA: Diagnosis not present

## 2022-11-10 DIAGNOSIS — C3490 Malignant neoplasm of unspecified part of unspecified bronchus or lung: Secondary | ICD-10-CM

## 2022-11-10 DIAGNOSIS — C7971 Secondary malignant neoplasm of right adrenal gland: Secondary | ICD-10-CM | POA: Diagnosis not present

## 2022-11-10 DIAGNOSIS — C3432 Malignant neoplasm of lower lobe, left bronchus or lung: Secondary | ICD-10-CM | POA: Diagnosis not present

## 2022-11-10 LAB — CBC WITH DIFFERENTIAL (CANCER CENTER ONLY)
Abs Immature Granulocytes: 0.07 10*3/uL (ref 0.00–0.07)
Basophils Absolute: 0.1 10*3/uL (ref 0.0–0.1)
Basophils Relative: 1 %
Eosinophils Absolute: 0.1 10*3/uL (ref 0.0–0.5)
Eosinophils Relative: 2 %
HCT: 33 % — ABNORMAL LOW (ref 39.0–52.0)
Hemoglobin: 11.8 g/dL — ABNORMAL LOW (ref 13.0–17.0)
Immature Granulocytes: 2 %
Lymphocytes Relative: 25 %
Lymphs Abs: 1 10*3/uL (ref 0.7–4.0)
MCH: 31.1 pg (ref 26.0–34.0)
MCHC: 35.8 g/dL (ref 30.0–36.0)
MCV: 87.1 fL (ref 80.0–100.0)
Monocytes Absolute: 0.2 10*3/uL (ref 0.1–1.0)
Monocytes Relative: 4 %
Neutro Abs: 2.6 10*3/uL (ref 1.7–7.7)
Neutrophils Relative %: 66 %
Platelet Count: 162 10*3/uL (ref 150–400)
RBC: 3.79 MIL/uL — ABNORMAL LOW (ref 4.22–5.81)
RDW: 13.9 % (ref 11.5–15.5)
WBC Count: 3.9 10*3/uL — ABNORMAL LOW (ref 4.0–10.5)
nRBC: 0 % (ref 0.0–0.2)

## 2022-11-10 LAB — CMP (CANCER CENTER ONLY)
ALT: 17 U/L (ref 0–44)
AST: 20 U/L (ref 15–41)
Albumin: 4.1 g/dL (ref 3.5–5.0)
Alkaline Phosphatase: 62 U/L (ref 38–126)
Anion gap: 8 (ref 5–15)
BUN: 19 mg/dL (ref 6–20)
CO2: 28 mmol/L (ref 22–32)
Calcium: 7.7 mg/dL — ABNORMAL LOW (ref 8.9–10.3)
Chloride: 101 mmol/L (ref 98–111)
Creatinine: 0.96 mg/dL (ref 0.61–1.24)
GFR, Estimated: >60 mL/min (ref >60)
Glucose, Bld: 99 mg/dL (ref 70–99)
Potassium: 3.7 mmol/L (ref 3.5–5.1)
Sodium: 137 mmol/L (ref 135–145)
Total Bilirubin: 0.4 mg/dL (ref 0.3–1.2)
Total Protein: 7.2 g/dL (ref 6.5–8.1)

## 2022-11-10 MED ORDER — HEPARIN SOD (PORK) LOCK FLUSH 100 UNIT/ML IV SOLN
500.0000 [IU] | Freq: Once | INTRAVENOUS | Status: AC
  Administered 2022-11-10: 500 [IU]

## 2022-11-10 MED ORDER — SODIUM CHLORIDE 0.9% FLUSH
10.0000 mL | Freq: Once | INTRAVENOUS | Status: AC
  Administered 2022-11-10: 10 mL

## 2022-11-16 ENCOUNTER — Other Ambulatory Visit: Payer: Self-pay | Admitting: Radiation Therapy

## 2022-11-16 ENCOUNTER — Ambulatory Visit
Admission: RE | Admit: 2022-11-16 | Discharge: 2022-11-16 | Disposition: A | Discharge status: Home / Self Care | Payer: Medicare HMO | Source: Ambulatory Visit | Attending: Internal Medicine | Admitting: Internal Medicine

## 2022-11-16 DIAGNOSIS — C7889 Secondary malignant neoplasm of other digestive organs: Secondary | ICD-10-CM | POA: Insufficient documentation

## 2022-11-16 DIAGNOSIS — I700 Atherosclerosis of aorta: Secondary | ICD-10-CM | POA: Insufficient documentation

## 2022-11-16 DIAGNOSIS — N179 Acute kidney failure, unspecified: Secondary | ICD-10-CM | POA: Insufficient documentation

## 2022-11-16 DIAGNOSIS — C779 Secondary and unspecified malignant neoplasm of lymph node, unspecified: Secondary | ICD-10-CM | POA: Insufficient documentation

## 2022-11-16 DIAGNOSIS — C7931 Secondary malignant neoplasm of brain: Secondary | ICD-10-CM

## 2022-11-16 DIAGNOSIS — I10 Essential (primary) hypertension: Secondary | ICD-10-CM | POA: Insufficient documentation

## 2022-11-16 DIAGNOSIS — Z510 Encounter for antineoplastic radiation therapy: Secondary | ICD-10-CM | POA: Insufficient documentation

## 2022-11-16 DIAGNOSIS — Z5111 Encounter for antineoplastic chemotherapy: Secondary | ICD-10-CM | POA: Insufficient documentation

## 2022-11-16 DIAGNOSIS — C3432 Malignant neoplasm of lower lobe, left bronchus or lung: Secondary | ICD-10-CM | POA: Insufficient documentation

## 2022-11-16 DIAGNOSIS — C7972 Secondary malignant neoplasm of left adrenal gland: Secondary | ICD-10-CM | POA: Insufficient documentation

## 2022-11-16 DIAGNOSIS — R197 Diarrhea, unspecified: Secondary | ICD-10-CM | POA: Insufficient documentation

## 2022-11-16 DIAGNOSIS — M479 Spondylosis, unspecified: Secondary | ICD-10-CM | POA: Insufficient documentation

## 2022-11-16 DIAGNOSIS — C7971 Secondary malignant neoplasm of right adrenal gland: Secondary | ICD-10-CM | POA: Insufficient documentation

## 2022-11-16 NOTE — Progress Notes (Signed)
  Radiation Oncology         [phone] ________________________________  Name: Brett [facility] MRN: [id number]  Date of Service: 11/16/2022  DOB: [birth year]  Post Treatment Telephone Note  Diagnosis:  Extensive Stage Small Cell Carcinoma of the LLL with nodal, adrenal, and brain metastases   Intent: Palliative  Radiation Treatment Dates: 09/21/2022 through 10/02/2022 Site Technique Total Dose (Gy) Dose per Fx (Gy) Completed Fx Beam Energies  Brain:  Whole Brain Complex 30/30 3 10/10 6X   (as documented in provider EOT note)   The patient was not available for call today. Voicemail left.  The patient was counseled that they will be contacted by our brain and spine navigator to schedule surveillance imaging. The patient was encouraged to call if they have not received a call to schedule imaging, or if they develop concerns or questions regarding radiation. The patient will also continue to follow up with Dr. Julien Nordmann in medical oncology.   Leandra Kern, LPN

## 2022-11-17 ENCOUNTER — Other Ambulatory Visit: Payer: Self-pay | Admitting: Radiation Therapy

## 2022-11-17 ENCOUNTER — Other Ambulatory Visit: Payer: Self-pay

## 2022-11-17 ENCOUNTER — Telehealth: Payer: Self-pay | Admitting: Radiation Therapy

## 2022-11-17 ENCOUNTER — Inpatient Hospital Stay: Payer: Medicare HMO

## 2022-11-17 DIAGNOSIS — I10 Essential (primary) hypertension: Secondary | ICD-10-CM | POA: Diagnosis not present

## 2022-11-17 DIAGNOSIS — C3432 Malignant neoplasm of lower lobe, left bronchus or lung: Secondary | ICD-10-CM | POA: Diagnosis not present

## 2022-11-17 DIAGNOSIS — C7931 Secondary malignant neoplasm of brain: Secondary | ICD-10-CM

## 2022-11-17 DIAGNOSIS — Z95828 Presence of other vascular implants and grafts: Secondary | ICD-10-CM

## 2022-11-17 DIAGNOSIS — C3490 Malignant neoplasm of unspecified part of unspecified bronchus or lung: Secondary | ICD-10-CM

## 2022-11-17 DIAGNOSIS — C7971 Secondary malignant neoplasm of right adrenal gland: Secondary | ICD-10-CM | POA: Diagnosis not present

## 2022-11-17 DIAGNOSIS — Z5112 Encounter for antineoplastic immunotherapy: Secondary | ICD-10-CM | POA: Diagnosis not present

## 2022-11-17 DIAGNOSIS — C7972 Secondary malignant neoplasm of left adrenal gland: Secondary | ICD-10-CM | POA: Diagnosis not present

## 2022-11-17 DIAGNOSIS — Z5111 Encounter for antineoplastic chemotherapy: Secondary | ICD-10-CM | POA: Diagnosis not present

## 2022-11-17 LAB — CMP (CANCER CENTER ONLY)
ALT: 19 U/L (ref 0–44)
AST: 20 U/L (ref 15–41)
Albumin: 4.2 g/dL (ref 3.5–5.0)
Alkaline Phosphatase: 64 U/L (ref 38–126)
Anion gap: 7 (ref 5–15)
BUN: 16 mg/dL (ref 6–20)
CO2: 27 mmol/L (ref 22–32)
Calcium: 7.9 mg/dL — ABNORMAL LOW (ref 8.9–10.3)
Chloride: 104 mmol/L (ref 98–111)
Creatinine: 1.02 mg/dL (ref 0.61–1.24)
GFR, Estimated: >60 mL/min (ref >60)
Glucose, Bld: 131 mg/dL — ABNORMAL HIGH (ref 70–99)
Potassium: 4 mmol/L (ref 3.5–5.1)
Sodium: 138 mmol/L (ref 135–145)
Total Bilirubin: 0.4 mg/dL (ref 0.3–1.2)
Total Protein: 7.1 g/dL (ref 6.5–8.1)

## 2022-11-17 LAB — CBC WITH DIFFERENTIAL (CANCER CENTER ONLY)
Abs Immature Granulocytes: 0 10*3/uL (ref 0.00–0.07)
Basophils Absolute: 0 10*3/uL (ref 0.0–0.1)
Basophils Relative: 1 %
Eosinophils Absolute: 0.1 10*3/uL (ref 0.0–0.5)
Eosinophils Relative: 2 %
HCT: 33.3 % — ABNORMAL LOW (ref 39.0–52.0)
Hemoglobin: 11.6 g/dL — ABNORMAL LOW (ref 13.0–17.0)
Immature Granulocytes: 0 %
Lymphocytes Relative: 40 %
Lymphs Abs: 1.2 10*3/uL (ref 0.7–4.0)
MCH: 30.8 pg (ref 26.0–34.0)
MCHC: 34.8 g/dL (ref 30.0–36.0)
MCV: 88.3 fL (ref 80.0–100.0)
Monocytes Absolute: 0.7 10*3/uL (ref 0.1–1.0)
Monocytes Relative: 22 %
Neutro Abs: 1.1 10*3/uL — ABNORMAL LOW (ref 1.7–7.7)
Neutrophils Relative %: 35 %
Platelet Count: 124 10*3/uL — ABNORMAL LOW (ref 150–400)
RBC: 3.77 MIL/uL — ABNORMAL LOW (ref 4.22–5.81)
RDW: 14.2 % (ref 11.5–15.5)
WBC Count: 3.1 10*3/uL — ABNORMAL LOW (ref 4.0–10.5)
nRBC: 0 % (ref 0.0–0.2)

## 2022-11-17 MED ORDER — HEPARIN SOD (PORK) LOCK FLUSH 100 UNIT/ML IV SOLN
500.0000 [IU] | Freq: Once | INTRAVENOUS | Status: AC
  Administered 2022-11-17: 500 [IU]

## 2022-11-17 MED ORDER — SODIUM CHLORIDE 0.9% FLUSH
10.0000 mL | Freq: Once | INTRAVENOUS | Status: AC
  Administered 2022-11-17: 10 mL

## 2022-11-17 NOTE — Progress Notes (Signed)
Orders entered for port to be accessed the day of brain MRI.   Mont Dutton R.T(R)(T) Radiation Special Procedures Navigator

## 2022-11-17 NOTE — Telephone Encounter (Signed)
I spoke with [name] sister about his upcoming brain MRI and telephone follow-up with [facility] on May. I reminded her that the follow-up visit on 5/14 is by telephone, NOT IN PERSON. She understands and was thankful for the call.   Mont Dutton R.T.(R)(T) Radiation Special Procedures Navigator

## 2022-11-19 ENCOUNTER — Ambulatory Visit (HOSPITAL_COMMUNITY)
Admission: RE | Admit: 2022-11-19 | Discharge: 2022-11-19 | Disposition: A | Discharge status: Home / Self Care | Payer: Medicare HMO | Source: Ambulatory Visit | Attending: Internal Medicine | Admitting: Internal Medicine

## 2022-11-19 DIAGNOSIS — J439 Emphysema, unspecified: Secondary | ICD-10-CM | POA: Diagnosis not present

## 2022-11-19 DIAGNOSIS — C3490 Malignant neoplasm of unspecified part of unspecified bronchus or lung: Secondary | ICD-10-CM | POA: Diagnosis not present

## 2022-11-19 MED ORDER — HEPARIN SOD (PORK) LOCK FLUSH 100 UNIT/ML IV SOLN
INTRAVENOUS | Status: AC
  Administered 2022-11-19: 500 [IU]
  Filled 2022-11-19: qty 5

## 2022-11-19 MED ORDER — IOHEXOL 300 MG/ML  SOLN
100.0000 mL | Freq: Once | INTRAMUSCULAR | Status: AC | PRN
  Administered 2022-11-19: 100 mL via INTRAVENOUS

## 2022-11-19 MED ORDER — SODIUM CHLORIDE (PF) 0.9 % IJ SOLN
INTRAMUSCULAR | Status: AC
  Filled 2022-11-19: qty 50

## 2022-11-23 ENCOUNTER — Inpatient Hospital Stay: Payer: Medicare HMO

## 2022-11-23 ENCOUNTER — Encounter: Payer: Self-pay | Admitting: Medical Oncology

## 2022-11-23 ENCOUNTER — Other Ambulatory Visit: Payer: Self-pay

## 2022-11-23 ENCOUNTER — Inpatient Hospital Stay: Payer: Medicare HMO | Attending: Physician Assistant | Admitting: Internal Medicine

## 2022-11-23 ENCOUNTER — Encounter: Payer: Self-pay | Admitting: Internal Medicine

## 2022-11-23 VITALS — BP 110/84 | HR 80 | Temp 98.0°F | Resp 18 | Wt 158.5 lb

## 2022-11-23 DIAGNOSIS — C7972 Secondary malignant neoplasm of left adrenal gland: Secondary | ICD-10-CM | POA: Insufficient documentation

## 2022-11-23 DIAGNOSIS — Z79620 Long term (current) use of immunosuppressive biologic: Secondary | ICD-10-CM | POA: Insufficient documentation

## 2022-11-23 DIAGNOSIS — F17200 Nicotine dependence, unspecified, uncomplicated: Secondary | ICD-10-CM | POA: Diagnosis not present

## 2022-11-23 DIAGNOSIS — C3490 Malignant neoplasm of unspecified part of unspecified bronchus or lung: Secondary | ICD-10-CM

## 2022-11-23 DIAGNOSIS — C3432 Malignant neoplasm of lower lobe, left bronchus or lung: Secondary | ICD-10-CM | POA: Insufficient documentation

## 2022-11-23 DIAGNOSIS — C7931 Secondary malignant neoplasm of brain: Secondary | ICD-10-CM | POA: Insufficient documentation

## 2022-11-23 DIAGNOSIS — Z5112 Encounter for antineoplastic immunotherapy: Secondary | ICD-10-CM | POA: Diagnosis not present

## 2022-11-23 DIAGNOSIS — Z95828 Presence of other vascular implants and grafts: Secondary | ICD-10-CM

## 2022-11-23 DIAGNOSIS — C7971 Secondary malignant neoplasm of right adrenal gland: Secondary | ICD-10-CM | POA: Diagnosis not present

## 2022-11-23 LAB — CMP (CANCER CENTER ONLY)
ALT: 22 U/L (ref 0–44)
AST: 22 U/L (ref 15–41)
Albumin: 4.1 g/dL (ref 3.5–5.0)
Alkaline Phosphatase: 69 U/L (ref 38–126)
Anion gap: 8 (ref 5–15)
BUN: 12 mg/dL (ref 6–20)
CO2: 27 mmol/L (ref 22–32)
Calcium: 8 mg/dL — ABNORMAL LOW (ref 8.9–10.3)
Chloride: 103 mmol/L (ref 98–111)
Creatinine: 1.12 mg/dL (ref 0.61–1.24)
GFR, Estimated: >60 mL/min (ref >60)
Glucose, Bld: 139 mg/dL — ABNORMAL HIGH (ref 70–99)
Potassium: 3.8 mmol/L (ref 3.5–5.1)
Sodium: 138 mmol/L (ref 135–145)
Total Bilirubin: 0.3 mg/dL (ref 0.3–1.2)
Total Protein: 7.3 g/dL (ref 6.5–8.1)

## 2022-11-23 LAB — CBC WITH DIFFERENTIAL (CANCER CENTER ONLY)
Abs Immature Granulocytes: 0.04 10*3/uL (ref 0.00–0.07)
Basophils Absolute: 0.1 10*3/uL (ref 0.0–0.1)
Basophils Relative: 2 %
Eosinophils Absolute: 0.1 10*3/uL (ref 0.0–0.5)
Eosinophils Relative: 3 %
HCT: 35.2 % — ABNORMAL LOW (ref 39.0–52.0)
Hemoglobin: 12.5 g/dL — ABNORMAL LOW (ref 13.0–17.0)
Immature Granulocytes: 1 %
Lymphocytes Relative: 35 %
Lymphs Abs: 1.3 10*3/uL (ref 0.7–4.0)
MCH: 31.6 pg (ref 26.0–34.0)
MCHC: 35.5 g/dL (ref 30.0–36.0)
MCV: 88.9 fL (ref 80.0–100.0)
Monocytes Absolute: 0.8 10*3/uL (ref 0.1–1.0)
Monocytes Relative: 22 %
Neutro Abs: 1.4 10*3/uL — ABNORMAL LOW (ref 1.7–7.7)
Neutrophils Relative %: 37 %
Platelet Count: 250 10*3/uL (ref 150–400)
RBC: 3.96 MIL/uL — ABNORMAL LOW (ref 4.22–5.81)
RDW: 14.4 % (ref 11.5–15.5)
WBC Count: 3.7 10*3/uL — ABNORMAL LOW (ref 4.0–10.5)
nRBC: 0 % (ref 0.0–0.2)

## 2022-11-23 MED ORDER — SODIUM CHLORIDE 0.9 % IV SOLN
1500.0000 mg | Freq: Once | INTRAVENOUS | Status: AC
  Administered 2022-11-23: 1500 mg via INTRAVENOUS
  Filled 2022-11-23: qty 30

## 2022-11-23 MED ORDER — SODIUM CHLORIDE 0.9% FLUSH
10.0000 mL | Freq: Once | INTRAVENOUS | Status: AC
  Administered 2022-11-23: 10 mL

## 2022-11-23 MED ORDER — SODIUM CHLORIDE 0.9 % IV SOLN: Freq: Once | INTRAVENOUS | Status: AC

## 2022-11-23 MED ORDER — HEPARIN SOD (PORK) LOCK FLUSH 100 UNIT/ML IV SOLN
500.0000 [IU] | Freq: Once | INTRAVENOUS | Status: AC | PRN
  Administered 2022-11-23: 500 [IU]

## 2022-11-23 MED ORDER — SODIUM CHLORIDE 0.9% FLUSH
10.0000 mL | INTRAVENOUS | Status: DC | PRN
  Administered 2022-11-23: 10 mL

## 2022-11-23 NOTE — Patient Instructions (Signed)
[facility]  Discharge Instructions: Thank you for choosing [facility] to provide your oncology and hematology care.   If you have a lab appointment with the Cancer Center, please go directly to the Cancer Center and check in at the registration area.   Wear comfortable clothing and clothing appropriate for easy access to any Portacath or PICC line.   We strive to give you quality time with your provider. You may need to reschedule your appointment if you arrive late (15 or more minutes).  Arriving late affects you and other patients whose appointments are after yours.  Also, if you miss three or more appointments without notifying the office, you may be dismissed from the clinic at the provider's discretion.      For prescription refill requests, have your pharmacy contact our office and allow 72 hours for refills to be completed.    Today you received the following chemotherapy and/or immunotherapy agents: Durvalumab      To help prevent nausea and vomiting after your treatment, we encourage you to take your nausea medication as directed.  BELOW ARE SYMPTOMS THAT SHOULD BE REPORTED IMMEDIATELY: *FEVER GREATER THAN 100.4 F (38 C) OR HIGHER *CHILLS OR SWEATING *NAUSEA AND VOMITING THAT IS NOT CONTROLLED WITH YOUR NAUSEA MEDICATION *UNUSUAL SHORTNESS OF BREATH *UNUSUAL BRUISING OR BLEEDING *URINARY PROBLEMS (pain or burning when urinating, or frequent urination) *BOWEL PROBLEMS (unusual diarrhea, constipation, pain near the anus) TENDERNESS IN MOUTH AND THROAT WITH OR WITHOUT PRESENCE OF ULCERS (sore throat, sores in mouth, or a toothache) UNUSUAL RASH, SWELLING OR PAIN  UNUSUAL VAGINAL DISCHARGE OR ITCHING   Items with * indicate a potential emergency and should be followed up as soon as possible or go to the Emergency Department if any problems should occur.  Please show the CHEMOTHERAPY ALERT CARD or IMMUNOTHERAPY ALERT CARD at  check-in to the Emergency Department and triage nurse.  Should you have questions after your visit or need to cancel or reschedule your appointment, please contact [facility]  Dept: [phone]  and follow the prompts.  Office hours are 8:00 a.m. to 4:30 p.m. Monday - Friday. Please note that voicemails left after 4:00 p.m. may not be returned until the following business day.  We are closed weekends and major holidays. You have access to a nurse at all times for urgent questions. Please call the main number to the clinic Dept: [phone] and follow the prompts.   For any non-urgent questions, you may also contact your provider using MyChart. We now offer e-Visits for anyone 18 and older to request care online for non-urgent symptoms. For details visit [email or web address].   Also download the MyChart app! Go to the app store, search "MyChart", open the app, select [facility], and log in with your MyChart username and password.   

## 2022-11-23 NOTE — Progress Notes (Signed)
[facility] [phone]   [phone]  OFFICE PROGRESS NOTE  [name], NP [place] [address]  DIAGNOSIS: Extensive stage (T4, N3, M1 C) small cell lung cancer presented with obstructing left lower lobe lung mass with left extrapleural lymph node, bilateral adrenal metastasis, pancreatic lesion as well as upper abdominal lymphadenopathy and soft tissue nodules in the abdominal wall as well as the abdomen diagnosed in December 2023.  PRIOR THERAPY: Whole brain irradiation for brain metastasis.  CURRENT THERAPY: Systemic chemotherapy with carboplatin for AUC of 5 on day 1, etoposide 100 Mg/M2 on days 1, 2 and 3 with Cosela 240 Mg/M2 on the days of the chemotherapy and Imfinzi 1500 Mg IV every 3 weeks.  First dose August 10, 2022.  Status post 4 cycles.  Starting from cycle #5 he will be on maintenance treatment with Imfinzi 1500 Mg IV every 4 weeks.  INTERVAL HISTORY: Brett Campbell 62 y.o. male returns to the clinic today for follow-up visit accompanied by his sister and his sign interpreter.  The patient is feeling fine today with no concerning complaints except for the frequent episodes of diarrhea.  He was seen by gastroenterology and was given prescription for cholestyramine but he does not take it as recommended.  He has no current nausea, vomiting, abdominal pain or constipation.  He has no chest pain continues to have mild cough which significantly improved with no shortness of breath or hemoptysis.  He has no recent weight loss or night sweats.  He has been tolerating his treatment fairly well.  Unfortunately he continues to smoke and I strongly encouraged him to quit.  He is here today for evaluation with repeat CT scan of the chest, abdomen and pelvis for restaging of his disease.  MEDICAL HISTORY: Past Medical History:  Diagnosis Date   Calcium deficiency    Cataract    COPD (chronic obstructive  pulmonary disease)    moderate emphysema   Deaf    Hypertension    Hypothyroidism    Personal history of colonic polyps 11/03/2020   Pneumonia    August 2023   Scarlet fever    Sleep apnea    never used a Cpap, used to use O2   Thyroid disease    Tuberculosis    [place] Health department is contact with him due to hx.    ALLERGIES:  is allergic to penicillins.  MEDICATIONS:  Current Outpatient Medications  Medication Sig Dispense Refill   amLODipine (NORVASC) 5 MG tablet Take 1 tablet (5 mg total) by mouth daily. 90 tablet 3   atorvastatin (LIPITOR) 10 MG tablet TAKE 1 TABLET EVERY DAY 90 tablet 3   calcitRIOL (ROCALTROL) 0.25 MCG capsule Take 1 capsule (0.25 mcg total) by mouth daily. 90 capsule 3   calcium carbonate (TUMS) 500 MG chewable tablet Chew 1 tablet (200 mg of elemental calcium total) by mouth 3 (three) times daily. 90 tablet 3   colestipol (COLESTID) 1 g tablet Take 1 tablet (1 g total) by mouth 2 (two) times daily. (Patient taking differently: Take 1 g by mouth daily.) 60 tablet 5   guaifenesin (MUCUS RELIEF CHEST CONGESTION) 400 MG TABS tablet Take 400 mg by mouth every 4 (four) hours as needed (cough).     HYDROcodone bit-homatropine (HYCODAN) 5-1.5 MG/5ML syrup Take 5 mLs by mouth every 6 hours as needed for cough. 240 mL 0   ibuprofen (ADVIL) 200 MG tablet  Take 400-800 mg by mouth every 6 (six) hours as needed for moderate pain.     levothyroxine (SYNTHROID) 150 MCG tablet TAKE 1 TABLET EVERY DAY 90 tablet 0   lidocaine-prilocaine (EMLA) cream Apply to affected area once 30 g 3   loperamide (IMODIUM A-D) 2 MG tablet Take 1 tablet (2 mg total) by mouth 4 (four) times daily as needed for diarrhea or loose stools. 30 tablet 0   ondansetron (ZOFRAN) 8 MG tablet Take 1 tablet (8 mg total) by mouth every 8 (eight) hours as needed for nausea, vomiting or refractory nausea / vomiting. Start on the third day after carboplatin. (Patient taking differently: Take 8 mg by mouth  every 6 (six) hours as needed for nausea, vomiting or refractory nausea / vomiting. Start on the third day after carboplatin.) 30 tablet 1   potassium chloride SA (KLOR-CON M) 20 MEQ tablet TAKE 1 TABLET EVERY DAY 90 tablet 3   prochlorperazine (COMPAZINE) 10 MG tablet Take 1 tablet (10 mg total) by mouth every 6 (six) hours as needed. (Patient taking differently: Take 10 mg by mouth every 6 (six) hours as needed for nausea or vomiting.) 30 tablet 2   No current facility-administered medications for this visit.    SURGICAL HISTORY:  Past Surgical History:  Procedure Laterality Date   CHOLECYSTECTOMY     COLONOSCOPY     +5years   IR IMAGING GUIDED PORT INSERTION  08/14/2022   LAPAROSCOPIC RIGHT HEMI COLECTOMY N/A 02/26/2021   Procedure: LAPAROSCOPIC RIGHT HEMI COLECTOMY;  Surgeon: Ileana Roup, MD;  Location: [facility];  Service: General;  Laterality: N/A;   THYROIDECTOMY     VIDEO BRONCHOSCOPY WITH ENDOBRONCHIAL ULTRASOUND N/A 07/24/2022   Procedure: VIDEO BRONCHOSCOPY WITH ENDOBRONCHIAL ULTRASOUND  WITH LEFT LOWER LOBE  MASS BIOPSIES;  Surgeon: [name], MD;  Location: [facility];  Service: Pulmonary;  Laterality: N/A;    REVIEW OF SYSTEMS:  Constitutional: positive for fatigue Eyes: negative Ears, nose, mouth, throat, and face: negative Respiratory: positive for cough and dyspnea on exertion Cardiovascular: negative Gastrointestinal: positive for diarrhea Genitourinary:negative Integument/breast: negative Hematologic/lymphatic: negative Musculoskeletal:negative Neurological: negative Behavioral/Psych: negative Endocrine: negative Allergic/Immunologic: negative   PHYSICAL EXAMINATION: General appearance: alert, cooperative, and fatigued Head: Normocephalic, without obvious abnormality, atraumatic Neck: no adenopathy, no JVD, supple, symmetrical, trachea midline, and thyroid not enlarged, symmetric, no tenderness/mass/nodules Lymph nodes: Cervical, supraclavicular, and  axillary nodes normal. Resp: clear to auscultation bilaterally Back: symmetric, no curvature. ROM normal. No CVA tenderness. Cardio: regular rate and rhythm, S1, S2 normal, no murmur, click, rub or gallop GI: soft, non-tender; bowel sounds normal; no masses,  no organomegaly Extremities: extremities normal, atraumatic, no cyanosis or edema Neurologic: Alert and oriented X 3, normal strength and tone. Normal symmetric reflexes. Normal coordination and gait  ECOG PERFORMANCE STATUS: 1 - Symptomatic but completely ambulatory  Blood pressure 110/84, pulse 80, temperature 98 F (36.7 C), temperature source Oral, resp. rate 18, weight 158 lb 8 oz (71.9 kg), SpO2 100 %.  LABORATORY DATA: Lab Results  Component Value Date   WBC 3.7 (L) 11/23/2022   HGB 12.5 (L) 11/23/2022   HCT 35.2 (L) 11/23/2022   MCV 88.9 11/23/2022   PLT 250 11/23/2022      Chemistry      Component Value Date/Time   NA 138 11/17/2022 1244   NA 139 04/28/2022 1038   K 4.0 11/17/2022 1244   CL 104 11/17/2022 1244   CO2 27 11/17/2022 1244   BUN 16 11/17/2022  1244   BUN 14 04/28/2022 1038   CREATININE 1.02 11/17/2022 1244      Component Value Date/Time   CALCIUM 7.9 (L) 11/17/2022 1244   ALKPHOS 64 11/17/2022 1244   AST 20 11/17/2022 1244   ALT 19 11/17/2022 1244   BILITOT 0.4 11/17/2022 1244       RADIOGRAPHIC STUDIES: CT Chest W Contrast  Result Date: 11/22/2022 CLINICAL DATA:  Small-cell lung cancer. Restaging. * Tracking Code: BO * EXAM: CT CHEST, ABDOMEN, AND PELVIS WITH CONTRAST TECHNIQUE: Multidetector CT imaging of the chest, abdomen and pelvis was performed following the standard protocol during bolus administration of intravenous contrast. RADIATION DOSE REDUCTION: This exam was performed according to the departmental dose-optimization program which includes automated exposure control, adjustment of the mA and/or kV according to patient size and/or use of iterative reconstruction technique. CONTRAST:   133mL OMNIPAQUE IOHEXOL 300 MG/ML  SOLN COMPARISON:  09/16/2022 FINDINGS: CT CHEST FINDINGS Cardiovascular: The heart size is normal. No substantial pericardial effusion. Coronary artery calcification is evident. Mild atherosclerotic calcification is noted in the wall of the thoracic aorta. Right Port-A-Cath tip is positioned in the right atrium. Mediastinum/Nodes: No mediastinal lymphadenopathy. Index 10 mm short axis subcarinal lymph node measured previously is 7 mm short axis today. There is no hilar lymphadenopathy. The esophagus has normal imaging features. There is no axillary lymphadenopathy. Lungs/Pleura: Centrilobular and paraseptal emphysema evident. Left infrahilar mass lesion was not well demonstrated on previous noncontrast CT but measured 6.5 x 4.2 cm on chest CT with contrast 06/19/2022. Lesion measures on the order of 1.7 x 1.4 cm today (39/2). Consolidative airspace disease in the left lower lobe previously has decreased substantially in the interval. There is some residual volume loss in the left lower lobe with persistent perifissural consolidative opacity, likely treatment related. Marked interval improvement and near complete resolution of the relatively diffuse tree-in-bud and peribronchovascular micro nodularity seen bilaterally on the previous study. No new or progressive findings on today's exam. No pleural effusion. Musculoskeletal: No worrisome lytic or sclerotic osseous abnormality. CT ABDOMEN PELVIS FINDINGS Hepatobiliary: No suspicious focal abnormality within the liver parenchyma. Gallbladder is surgically absent. No intrahepatic or extrahepatic biliary dilation. Pancreas: No focal mass lesion. No dilatation of the main duct. No intraparenchymal cyst. No peripancreatic edema. Spleen: No splenomegaly. No focal mass lesion. Adrenals/Urinary Tract: Bilateral adrenal nodules have decreased in the interval. Left adrenal nodule measured previously at 2.6 x 2.2 cm is now 1.1 x 0.9 cm. Right  adrenal nodule measured previously at 1.8 x 1.4 cm is now 1.1 x 0.8 cm. 8 mm subcapsular lesion lateral interpolar right kidney (65/2) may enhance after IV contrast administration left kidney unremarkable. No evidence for hydroureter. The urinary bladder appears normal for the degree of distention. Stomach/Bowel: Stomach is unremarkable. No gastric wall thickening. No evidence of outlet obstruction. Duodenum is normally positioned as is the ligament of Treitz. No small bowel wall thickening. No small bowel dilatation. Status post right hemicolectomy. No gross colonic mass. No colonic wall thickening. Vascular/Lymphatic: There is moderate atherosclerotic calcification of the abdominal aorta without aneurysm. There is no gastrohepatic or hepatoduodenal ligament lymphadenopathy. No retroperitoneal or mesenteric lymphadenopathy. No pelvic sidewall lymphadenopathy. Reproductive: The prostate gland and seminal vesicles are unremarkable. Other: No intraperitoneal free fluid. Musculoskeletal: No worrisome lytic or sclerotic osseous abnormality. IMPRESSION: 1. Interval decrease in size of the left infrahilar mass lesion with improved aeration in the left lower lobe. 2. Marked interval improvement and near complete resolution of the relatively diffuse tree-in-bud  and peribronchovascular micro nodularity seen bilaterally on the previous study. 3. Interval decrease in size of the bilateral adrenal nodules. 4. 8 mm subcapsular lesion lateral interpolar right kidney may enhance after IV contrast administration. Attention on follow-up recommended. 5. Aortic Atherosclerosis (ICD10-I70.0) and Emphysema (ICD10-J43.9). Electronically Signed   By: Misty Stanley M.D.   On: 11/22/2022 07:51   CT Abdomen Pelvis W Contrast  Result Date: 11/22/2022 CLINICAL DATA:  Small-cell lung cancer. Restaging. * Tracking Code: BO * EXAM: CT CHEST, ABDOMEN, AND PELVIS WITH CONTRAST TECHNIQUE: Multidetector CT imaging of the chest, abdomen and pelvis  was performed following the standard protocol during bolus administration of intravenous contrast. RADIATION DOSE REDUCTION: This exam was performed according to the departmental dose-optimization program which includes automated exposure control, adjustment of the mA and/or kV according to patient size and/or use of iterative reconstruction technique. CONTRAST:  194mL OMNIPAQUE IOHEXOL 300 MG/ML  SOLN COMPARISON:  09/16/2022 FINDINGS: CT CHEST FINDINGS Cardiovascular: The heart size is normal. No substantial pericardial effusion. Coronary artery calcification is evident. Mild atherosclerotic calcification is noted in the wall of the thoracic aorta. Right Port-A-Cath tip is positioned in the right atrium. Mediastinum/Nodes: No mediastinal lymphadenopathy. Index 10 mm short axis subcarinal lymph node measured previously is 7 mm short axis today. There is no hilar lymphadenopathy. The esophagus has normal imaging features. There is no axillary lymphadenopathy. Lungs/Pleura: Centrilobular and paraseptal emphysema evident. Left infrahilar mass lesion was not well demonstrated on previous noncontrast CT but measured 6.5 x 4.2 cm on chest CT with contrast 06/19/2022. Lesion measures on the order of 1.7 x 1.4 cm today (39/2). Consolidative airspace disease in the left lower lobe previously has decreased substantially in the interval. There is some residual volume loss in the left lower lobe with persistent perifissural consolidative opacity, likely treatment related. Marked interval improvement and near complete resolution of the relatively diffuse tree-in-bud and peribronchovascular micro nodularity seen bilaterally on the previous study. No new or progressive findings on today's exam. No pleural effusion. Musculoskeletal: No worrisome lytic or sclerotic osseous abnormality. CT ABDOMEN PELVIS FINDINGS Hepatobiliary: No suspicious focal abnormality within the liver parenchyma. Gallbladder is surgically absent. No  intrahepatic or extrahepatic biliary dilation. Pancreas: No focal mass lesion. No dilatation of the main duct. No intraparenchymal cyst. No peripancreatic edema. Spleen: No splenomegaly. No focal mass lesion. Adrenals/Urinary Tract: Bilateral adrenal nodules have decreased in the interval. Left adrenal nodule measured previously at 2.6 x 2.2 cm is now 1.1 x 0.9 cm. Right adrenal nodule measured previously at 1.8 x 1.4 cm is now 1.1 x 0.8 cm. 8 mm subcapsular lesion lateral interpolar right kidney (65/2) may enhance after IV contrast administration left kidney unremarkable. No evidence for hydroureter. The urinary bladder appears normal for the degree of distention. Stomach/Bowel: Stomach is unremarkable. No gastric wall thickening. No evidence of outlet obstruction. Duodenum is normally positioned as is the ligament of Treitz. No small bowel wall thickening. No small bowel dilatation. Status post right hemicolectomy. No gross colonic mass. No colonic wall thickening. Vascular/Lymphatic: There is moderate atherosclerotic calcification of the abdominal aorta without aneurysm. There is no gastrohepatic or hepatoduodenal ligament lymphadenopathy. No retroperitoneal or mesenteric lymphadenopathy. No pelvic sidewall lymphadenopathy. Reproductive: The prostate gland and seminal vesicles are unremarkable. Other: No intraperitoneal free fluid. Musculoskeletal: No worrisome lytic or sclerotic osseous abnormality. IMPRESSION: 1. Interval decrease in size of the left infrahilar mass lesion with improved aeration in the left lower lobe. 2. Marked interval improvement and near complete resolution of the  relatively diffuse tree-in-bud and peribronchovascular micro nodularity seen bilaterally on the previous study. 3. Interval decrease in size of the bilateral adrenal nodules. 4. 8 mm subcapsular lesion lateral interpolar right kidney may enhance after IV contrast administration. Attention on follow-up recommended. 5. Aortic  Atherosclerosis (ICD10-I70.0) and Emphysema (ICD10-J43.9). Electronically Signed   By: Misty Stanley M.D.   On: 11/22/2022 07:25    ASSESSMENT AND PLAN: This is a very pleasant 62 years old white male with extensive stage (T4, N3, M1 C) small cell lung cancer presented with obstructing left lower lobe lung mass with left extrapleural lymph node, bilateral adrenal metastasis, pancreatic lesion as well as upper abdominal lymphadenopathy and soft tissue nodules in the abdominal wall as well as the abdomen diagnosed in December 2023. The patient started the first cycle of his systemic chemotherapy with carboplatin for AUC of 5 on day 1 and etoposide 100 Mg/M2 on days 1, 2 and 3 with Cosela before the chemotherapy and Imfinzi 1500 Mg IV on day 1 every 3 weeks.  Status post 4 cycle given on August 10, 2022.  Starting from cycle #5 he will be on maintenance treatment with Imfinzi every 4 weeks. The patient has been tolerating this treatment well with no concerning adverse effects. He had repeat CT scan of the chest, abdomen and pelvis performed recently.  I personally and independently reviewed the scan and discussed the result with the patient and his sister. His scan showed continuous improvement of his disease. I recommended for him to proceed with cycle #5 with maintenance Imfinzi 1500 Mg IV every 4 weeks. He will come back for follow-up visit in 4 weeks for evaluation before the next cycle of his treatment. I strongly encouraged him to quit smoking. For the diarrhea he was encouraged to use cholestyramine as recommended. The patient was advised to call immediately if he has any other concerning symptoms in the interval. The patient voices understanding of current disease status and treatment options and is in agreement with the current care plan.  All questions were answered. The patient knows to call the clinic with any problems, questions or concerns. We can certainly see the patient much sooner if  necessary.  The total time spent in the appointment was 30 minutes.  Disclaimer: This note was dictated with voice recognition software. Similar sounding words can inadvertently be transcribed and may not be corrected upon review.

## 2022-11-23 NOTE — Progress Notes (Signed)
Patient seen by Dr. Inez Pilgrim are within treatment parameters.  Labs reviewed: and are not all within treatment parameters. Per Dr. Julien Nordmann . It is ok to treat pt today with Durvalumab and ANC=1.4  Per physician team, patient is ready for treatment and there are NO modifications to the treatment plan.

## 2022-11-24 ENCOUNTER — Other Ambulatory Visit: Payer: Self-pay

## 2022-11-30 ENCOUNTER — Inpatient Hospital Stay: Payer: Medicare HMO

## 2022-12-04 ENCOUNTER — Other Ambulatory Visit: Payer: Self-pay | Admitting: Internal Medicine

## 2022-12-08 ENCOUNTER — Other Ambulatory Visit: Payer: Self-pay

## 2022-12-18 ENCOUNTER — Other Ambulatory Visit: Payer: Self-pay

## 2022-12-18 NOTE — Progress Notes (Unsigned)
[facility] OFFICE PROGRESS NOTE  [name], NP [address] 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   PRIOR THERAPY: Whole brain irradiation for brain metastasis.   CURRENT THERAPY: Systemic chemotherapy with carboplatin for AUC of 5 on day 1, etoposide 100 Mg/M2 on days 1, 2 and 3 with Cosela 240 Mg/M2 on the days of the chemotherapy and Imfinzi 1500 Mg IV every 3 weeks.  First dose August 10, 2022.  Status post 5 cycles.  Starting from cycle #5 he will be on maintenance treatment with Imfinzi 1500 Mg IV every 4 weeks.   INTERVAL HISTORY: Brett Campbell 62 y.o. male returns to the clinic today for a follow-up visit accompanied by his sister and sign interpreter.  The patient completed 4 cycles of chemotherapy and immunotherapy.  He is currently on maintenance immunotherapy and is tolerating it well overall without any concerning complaints except for some mild rash on his chest with associated itching.  Today he denies any fever, chills, night sweats, or unexplained weight loss. His sister states that he is eating well. Denies any chest pain or hemoptysis. He denies significant cough. He relayed that his breathing is "fine". He has stable dyspnea on exertion. He tried to mow the lawn last week and over did it and was short of breath and vomited. He denies constipation. His diarrhea has improved as long as he takes his cholestyramine BID. Denies any headache or visual changes.  He is here today for evaluation repeat blood work before going cycle #6.   MEDICAL HISTORY: Past Medical History:  Diagnosis Date   Calcium deficiency     Cataract    COPD (chronic obstructive pulmonary disease) (HCC)    moderate emphysema   Deaf    Hypertension    Hypothyroidism    Personal history of colonic polyps 11/03/2020   Pneumonia    August 2023   Scarlet fever    Sleep apnea    never used a Cpap, used to use O2   Thyroid disease    Tuberculosis    [place] department is contact with him due to hx.    ALLERGIES:  is allergic to penicillins.  MEDICATIONS:  Current Outpatient Medications  Medication Sig Dispense Refill   triamcinolone cream (KENALOG) 0.1 % Apply 1 Application topically 2 (two) times daily as needed. 453.6 g 0   amLODipine (NORVASC) 5 MG tablet Take 1 tablet (5 mg total) by mouth daily. 90 tablet 3   atorvastatin (LIPITOR) 10 MG tablet TAKE 1 TABLET EVERY DAY 90 tablet 3   calcitRIOL (ROCALTROL) 0.25 MCG capsule TAKE 1 CAPSULE EVERY DAY 90 capsule 0   calcium carbonate (TUMS) 500 MG chewable tablet Chew 1 tablet (200 mg of elemental calcium total) by mouth 3 (three) times daily. 90 tablet 3   colestipol (COLESTID) 1 g tablet Take 1 tablet (1 g total) by mouth 2 (two) times daily. (Patient taking differently: Take 1 g by mouth daily.) 60 tablet 5   HYDROcodone bit-homatropine (HYCODAN) 5-1.5 MG/5ML syrup Take 5 mLs by mouth every 6 hours as needed for cough. 240 mL 0   ibuprofen (ADVIL) 200 MG tablet Take 400-800 mg  by mouth every 6 (six) hours as needed for moderate pain.     levothyroxine (SYNTHROID) 150 MCG tablet TAKE 1 TABLET EVERY DAY 90 tablet 0   lidocaine-prilocaine (EMLA) cream Apply to affected area once 30 g 3   loperamide (IMODIUM A-D) 2 MG tablet Take 1 tablet (2 mg total) by mouth 4 (four) times daily as needed for diarrhea or loose stools. 30 tablet 0   ondansetron (ZOFRAN) 8 MG tablet Take 1 tablet (8 mg total) by mouth every 8 (eight) hours as needed for nausea, vomiting or refractory nausea / vomiting. Start on the third day after carboplatin. (Patient taking differently: Take 8 mg by mouth  every 6 (six) hours as needed for nausea, vomiting or refractory nausea / vomiting. Start on the third day after carboplatin.) 30 tablet 1   potassium chloride SA (KLOR-CON M) 20 MEQ tablet TAKE 1 TABLET EVERY DAY 90 tablet 3   prochlorperazine (COMPAZINE) 10 MG tablet Take 1 tablet (10 mg total) by mouth every 6 (six) hours as needed. (Patient taking differently: Take 10 mg by mouth every 6 (six) hours as needed for nausea or vomiting.) 30 tablet 2   No current facility-administered medications for this visit.    SURGICAL HISTORY:  Past Surgical History:  Procedure Laterality Date   CHOLECYSTECTOMY     COLONOSCOPY     +5years   IR IMAGING GUIDED PORT INSERTION  08/14/2022   LAPAROSCOPIC RIGHT HEMI COLECTOMY N/A 02/26/2021   Procedure: LAPAROSCOPIC RIGHT HEMI COLECTOMY;  Surgeon: Andria Meuse, MD;  Location: [facility];  Service: General;  Laterality: N/A;   THYROIDECTOMY     VIDEO BRONCHOSCOPY WITH ENDOBRONCHIAL ULTRASOUND N/A 07/24/2022   Procedure: VIDEO BRONCHOSCOPY WITH ENDOBRONCHIAL ULTRASOUND  WITH LEFT LOWER LOBE  MASS BIOPSIES;  Surgeon: Martina Sinner, MD;  Location: [facility] OR;  Service: Pulmonary;  Laterality: N/A;    REVIEW OF SYSTEMS:   Review of Systems  Constitutional: Negative for appetite change, chills, fatigue, fever and unexpected weight change.  HENT:   Negative for mouth sores, nosebleeds, sore throat and trouble swallowing.   Eyes: Negative for eye problems and icterus.  Respiratory: Positive for dyspnea on exertion. Negative for cough, hemoptysis, and wheezing.   Cardiovascular: Negative for chest pain and leg swelling.  Gastrointestinal: Negative for abdominal pain, constipation, diarrhea (improved), nausea and vomiting.  Genitourinary: Negative for bladder incontinence, difficulty urinating, dysuria, frequency and hematuria.   Musculoskeletal: Negative for back pain, gait problem, neck pain and neck stiffness.  Skin:Positive for mild rash/itching on chest.   Neurological: Negative for dizziness, extremity weakness, gait problem, headaches, light-headedness and seizures.  Hematological: Negative for adenopathy. Does not bruise/bleed easily.  Psychiatric/Behavioral: Negative for confusion, depression and sleep disturbance. The patient is not nervous/anxious.     PHYSICAL EXAMINATION:  Blood pressure 130/86, pulse 79, temperature 97.7 F (36.5 C), temperature source Temporal, resp. rate 14, weight 157 lb 8 oz (71.4 kg), SpO2 98 %.  ECOG PERFORMANCE STATUS: 1  Physical Exam  Constitutional: Oriented to person, place, and time and well-developed, well-nourished, and in no distress.  HENT:  Head: Normocephalic and atraumatic. Positive for deafness.  Mouth/Throat: Oropharynx is clear and moist. No oropharyngeal exudate.  Eyes: Conjunctivae are normal. Right eye exhibits no discharge. Left eye exhibits no discharge. No scleral icterus.  Neck: Normal range of motion. Neck supple.  Cardiovascular: Normal rate, regular rhythm, normal heart sounds and intact distal pulses.   Pulmonary/Chest: Effort normal and breath sounds normal. No respiratory distress.  No wheezes. No rales.  Abdominal: Soft. Bowel sounds are normal. Exhibits no distension and no mass. There is no tenderness.  Musculoskeletal: Normal range of motion. Exhibits no edema.  Lymphadenopathy:    No cervical adenopathy.  Neurological: Alert and oriented to person, place, and time. Exhibits normal muscle tone. Gait normal. Coordination normal.  Skin: Skin is warm and dry. No rash noted. Not diaphoretic. No erythema. No pallor.  Psychiatric: Mood, memory and judgment normal.  Vitals reviewed.  LABORATORY DATA: Lab Results  Component Value Date   WBC 9.1 12/21/2022   HGB 13.8 12/21/2022   HCT 39.5 12/21/2022   MCV 89.2 12/21/2022   PLT 177 12/21/2022      Chemistry      Component Value Date/Time   NA 138 11/23/2022 1053   NA 139 04/28/2022 1038   K 3.8 11/23/2022 1053   CL 103  11/23/2022 1053   CO2 27 11/23/2022 1053   BUN 12 11/23/2022 1053   BUN 14 04/28/2022 1038   CREATININE 1.12 11/23/2022 1053      Component Value Date/Time   CALCIUM 8.0 (L) 11/23/2022 1053   ALKPHOS 69 11/23/2022 1053   AST 22 11/23/2022 1053   ALT 22 11/23/2022 1053   BILITOT 0.3 11/23/2022 1053       RADIOGRAPHIC STUDIES:  No results found.   ASSESSMENT/PLAN:  This is a very pleasant 62 year old Caucasian male with Extensive stage (T4, N3, M1 C) small cell lung cancer presented with obstructing left lower lobe lung mass with left extrapleural lymph node, bilateral adrenal metastasis, pancreatic lesion as well as upper abdominal lymphadenopathy and soft tissue nodules in the abdominal wall as well as the abdomen diagnosed in December 2023.   He is undergoing treatment palliative systemic chemotherapy with carboplatin for an AUC of 5 on day 1, etoposide 100 mg/m on days 1, 2, and 3 and Imfinzi 5000 mg IV every 3 weeks.  Starting from cycle #5, he started maintenance immunotherapy with Imfinzi 1500 mg single agent every 4 weeks.  Labs were reviewed.  Recommend that he proceed with cycle #6 today as schedule.   We will see him back for follow-up visit in 4 weeks for evaluation repeat blood work before undergoing cycle #7.   I will arrange for a restaging CT scan of the CAP prior to his next appointment.   The patient was advised to call immediately if she has any concerning symptoms in the interval. The patient voices understanding of current disease status and treatment options and is in agreement with the current care plan. All questions were answered. The patient knows to call the clinic with any problems, questions or concerns. We can certainly see the patient much sooner if necessary    Orders Placed This Encounter  Procedures   CT Chest W Contrast    Standing Status:   Future    Standing Expiration Date:   12/21/2023    Order Specific Question:   If indicated for the  ordered procedure, I authorize the administration of contrast media per Radiology protocol    Answer:   Yes    Order Specific Question:   Does the patient have a contrast media/X-ray dye allergy?    Answer:   No    Order Specific Question:   Preferred imaging location?    Answer:   [facility]   CT Abdomen Pelvis W Contrast    Standing Status:   Future    Standing Expiration Date:  12/21/2023    Order Specific Question:   If indicated for the ordered procedure, I authorize the administration of contrast media per Radiology protocol    Answer:   Yes    Order Specific Question:   Does the patient have a contrast media/X-ray dye allergy?    Answer:   No    Order Specific Question:   Preferred imaging location?    Answer:   [facility]    Order Specific Question:   If indicated for the ordered procedure, I authorize the administration of oral contrast media per Radiology protocol    Answer:   Yes     The total time spent in the appointment was 20-29 minutes.   Selassie Spatafore L Jessic Standifer, PA-C 12/21/22

## 2022-12-21 ENCOUNTER — Inpatient Hospital Stay (HOSPITAL_BASED_OUTPATIENT_CLINIC_OR_DEPARTMENT_OTHER): Payer: Medicare HMO | Admitting: Physician Assistant

## 2022-12-21 ENCOUNTER — Inpatient Hospital Stay: Payer: Medicare HMO

## 2022-12-21 VITALS — BP 114/69 | HR 65 | Resp 18

## 2022-12-21 VITALS — BP 130/86 | HR 79 | Temp 97.7°F | Resp 14 | Wt 157.5 lb

## 2022-12-21 DIAGNOSIS — C3490 Malignant neoplasm of unspecified part of unspecified bronchus or lung: Secondary | ICD-10-CM | POA: Diagnosis not present

## 2022-12-21 DIAGNOSIS — Z79620 Long term (current) use of immunosuppressive biologic: Secondary | ICD-10-CM | POA: Diagnosis not present

## 2022-12-21 DIAGNOSIS — C7971 Secondary malignant neoplasm of right adrenal gland: Secondary | ICD-10-CM | POA: Diagnosis not present

## 2022-12-21 DIAGNOSIS — F17200 Nicotine dependence, unspecified, uncomplicated: Secondary | ICD-10-CM | POA: Diagnosis not present

## 2022-12-21 DIAGNOSIS — Z5112 Encounter for antineoplastic immunotherapy: Secondary | ICD-10-CM | POA: Diagnosis not present

## 2022-12-21 DIAGNOSIS — Z95828 Presence of other vascular implants and grafts: Secondary | ICD-10-CM

## 2022-12-21 DIAGNOSIS — C7931 Secondary malignant neoplasm of brain: Secondary | ICD-10-CM | POA: Diagnosis not present

## 2022-12-21 DIAGNOSIS — C7972 Secondary malignant neoplasm of left adrenal gland: Secondary | ICD-10-CM | POA: Diagnosis not present

## 2022-12-21 DIAGNOSIS — C3432 Malignant neoplasm of lower lobe, left bronchus or lung: Secondary | ICD-10-CM | POA: Diagnosis not present

## 2022-12-21 LAB — CBC WITH DIFFERENTIAL (CANCER CENTER ONLY)
Abs Immature Granulocytes: 0.02 10*3/uL (ref 0.00–0.07)
Basophils Absolute: 0.1 10*3/uL (ref 0.0–0.1)
Basophils Relative: 1 %
Eosinophils Absolute: 0.5 10*3/uL (ref 0.0–0.5)
Eosinophils Relative: 6 %
HCT: 39.5 % (ref 39.0–52.0)
Hemoglobin: 13.8 g/dL (ref 13.0–17.0)
Immature Granulocytes: 0 %
Lymphocytes Relative: 19 %
Lymphs Abs: 1.7 10*3/uL (ref 0.7–4.0)
MCH: 31.2 pg (ref 26.0–34.0)
MCHC: 34.9 g/dL (ref 30.0–36.0)
MCV: 89.2 fL (ref 80.0–100.0)
Monocytes Absolute: 0.7 10*3/uL (ref 0.1–1.0)
Monocytes Relative: 8 %
Neutro Abs: 6.1 10*3/uL (ref 1.7–7.7)
Neutrophils Relative %: 66 %
Platelet Count: 177 10*3/uL (ref 150–400)
RBC: 4.43 MIL/uL (ref 4.22–5.81)
RDW: 12.9 % (ref 11.5–15.5)
WBC Count: 9.1 10*3/uL (ref 4.0–10.5)
nRBC: 0 % (ref 0.0–0.2)

## 2022-12-21 LAB — CMP (CANCER CENTER ONLY)
ALT: 16 U/L (ref 0–44)
AST: 18 U/L (ref 15–41)
Albumin: 4.3 g/dL (ref 3.5–5.0)
Alkaline Phosphatase: 69 U/L (ref 38–126)
Anion gap: 8 (ref 5–15)
BUN: 16 mg/dL (ref 6–20)
CO2: 28 mmol/L (ref 22–32)
Calcium: 8.8 mg/dL — ABNORMAL LOW (ref 8.9–10.3)
Chloride: 104 mmol/L (ref 98–111)
Creatinine: 1.12 mg/dL (ref 0.61–1.24)
GFR, Estimated: >60 mL/min (ref >60)
Glucose, Bld: 124 mg/dL — ABNORMAL HIGH (ref 70–99)
Potassium: 3.6 mmol/L (ref 3.5–5.1)
Sodium: 140 mmol/L (ref 135–145)
Total Bilirubin: 0.3 mg/dL (ref 0.3–1.2)
Total Protein: 7.2 g/dL (ref 6.5–8.1)

## 2022-12-21 LAB — TSH: TSH: 1.855 u[IU]/mL (ref 0.350–4.500)

## 2022-12-21 MED ORDER — SODIUM CHLORIDE 0.9 % IV SOLN
1500.0000 mg | Freq: Once | INTRAVENOUS | Status: AC
  Administered 2022-12-21: 1500 mg via INTRAVENOUS
  Filled 2022-12-21: qty 30

## 2022-12-21 MED ORDER — HEPARIN SOD (PORK) LOCK FLUSH 100 UNIT/ML IV SOLN
500.0000 [IU] | Freq: Once | INTRAVENOUS | Status: AC | PRN
  Administered 2022-12-21: 500 [IU]

## 2022-12-21 MED ORDER — SODIUM CHLORIDE 0.9 % IV SOLN: Freq: Once | INTRAVENOUS | Status: AC

## 2022-12-21 MED ORDER — TRIAMCINOLONE ACETONIDE 0.1 % EX CREA
1.0000 | TOPICAL_CREAM | Freq: Two times a day (BID) | CUTANEOUS | 0 refills | Status: DC | PRN
Start: 2022-12-21 — End: 2023-09-13

## 2022-12-21 MED ORDER — SODIUM CHLORIDE 0.9% FLUSH
10.0000 mL | Freq: Once | INTRAVENOUS | Status: AC
  Administered 2022-12-21: 10 mL

## 2022-12-21 MED ORDER — SODIUM CHLORIDE 0.9% FLUSH
10.0000 mL | INTRAVENOUS | Status: DC | PRN
  Administered 2022-12-21: 10 mL

## 2022-12-21 NOTE — Patient Instructions (Signed)
[facility]  Discharge Instructions: Thank you for choosing [facility] to provide your oncology and hematology care.   If you have a lab appointment with the Cancer Center, please go directly to the Cancer Center and check in at the registration area.   Wear comfortable clothing and clothing appropriate for easy access to any Portacath or PICC line.   We strive to give you quality time with your provider. You may need to reschedule your appointment if you arrive late (15 or more minutes).  Arriving late affects you and other patients whose appointments are after yours.  Also, if you miss three or more appointments without notifying the office, you may be dismissed from the clinic at the provider's discretion.      For prescription refill requests, have your pharmacy contact our office and allow 72 hours for refills to be completed.    Today you received the following chemotherapy and/or immunotherapy agents: Imfinzi      To help prevent nausea and vomiting after your treatment, we encourage you to take your nausea medication as directed.  BELOW ARE SYMPTOMS THAT SHOULD BE REPORTED IMMEDIATELY: *FEVER GREATER THAN 100.4 F (38 C) OR HIGHER *CHILLS OR SWEATING *NAUSEA AND VOMITING THAT IS NOT CONTROLLED WITH YOUR NAUSEA MEDICATION *UNUSUAL SHORTNESS OF BREATH *UNUSUAL BRUISING OR BLEEDING *URINARY PROBLEMS (pain or burning when urinating, or frequent urination) *BOWEL PROBLEMS (unusual diarrhea, constipation, pain near the anus) TENDERNESS IN MOUTH AND THROAT WITH OR WITHOUT PRESENCE OF ULCERS (sore throat, sores in mouth, or a toothache) UNUSUAL RASH, SWELLING OR PAIN  UNUSUAL VAGINAL DISCHARGE OR ITCHING   Items with * indicate a potential emergency and should be followed up as soon as possible or go to the Emergency Department if any problems should occur.  Please show the CHEMOTHERAPY ALERT CARD or IMMUNOTHERAPY ALERT CARD at  check-in to the Emergency Department and triage nurse.  Should you have questions after your visit or need to cancel or reschedule your appointment, please contact [facility]  Dept: [phone]  and follow the prompts.  Office hours are 8:00 a.m. to 4:30 p.m. Monday - Friday. Please note that voicemails left after 4:00 p.m. may not be returned until the following business day.  We are closed weekends and major holidays. You have access to a nurse at all times for urgent questions. Please call the main number to the clinic Dept: [phone] and follow the prompts.   For any non-urgent questions, you may also contact your provider using MyChart. We now offer e-Visits for anyone 18 and older to request care online for non-urgent symptoms. For details visit [email or web address].   Also download the MyChart app! Go to the app store, search "MyChart", open the app, select [facility], and log in with your MyChart username and password.   

## 2022-12-23 LAB — T4: T4, Total: 10.5 ug/dL (ref 4.5–12.0)

## 2022-12-29 ENCOUNTER — Ambulatory Visit (HOSPITAL_COMMUNITY)
Admission: RE | Admit: 2022-12-29 | Discharge: 2022-12-29 | Disposition: A | Discharge status: Home / Self Care | Payer: Medicare HMO | Source: Ambulatory Visit | Attending: Radiation Oncology | Admitting: Radiation Oncology

## 2022-12-29 DIAGNOSIS — C7931 Secondary malignant neoplasm of brain: Secondary | ICD-10-CM | POA: Insufficient documentation

## 2022-12-29 DIAGNOSIS — G9389 Other specified disorders of brain: Secondary | ICD-10-CM | POA: Diagnosis not present

## 2022-12-29 MED ORDER — HEPARIN SOD (PORK) LOCK FLUSH 100 UNIT/ML IV SOLN
500.0000 [IU] | INTRAVENOUS | Status: AC | PRN
  Administered 2022-12-29: 500 [IU]
  Filled 2022-12-29: qty 5

## 2022-12-29 MED ORDER — GADOBUTROL 1 MMOL/ML IV SOLN
7.5000 mL | Freq: Once | INTRAVENOUS | Status: AC | PRN
  Administered 2022-12-29: 7.5 mL via INTRAVENOUS

## 2022-12-31 ENCOUNTER — Other Ambulatory Visit: Payer: Self-pay | Admitting: Internal Medicine

## 2023-01-04 ENCOUNTER — Encounter: Payer: Self-pay | Admitting: Radiation Oncology

## 2023-01-04 NOTE — Progress Notes (Signed)
Radiation Oncology         [phone] ________________________________  Initial Outpatient Consultation - Conducted via telephone at patient request.  I spoke with the patient to conduct this visit via telephone. The patient was notified in advance and was offered an in person or telemedicine meeting to allow for face to face communication but instead preferred to proceed with a telephone visit.   Name: Brett Campbell        MRN: [id number]  Date of Service: 01/05/2023 DOB: [birth year]  CC:[name], NP  [name], NP     REFERRING PHYSICIAN: [name], NP   DIAGNOSIS:  Extensive Stage Small Cell Lung Cancer.    HISTORY OF PRESENT ILLNESS: Brett Campbell is a 62 y.o. 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 He was diagnosed on 07/24/22 when a bronchoscopy was performed and cytology of the LLL confirmed small cell carcinoma. He began systemic chemotherapy and immunotherapy on 08/10/22. He was found to have brain disease and went on to receive whole brain radiation which he completed on 10/02/22.He has continued with chemo/immunotherapy with Dr. Arbutus Ped.   He had an MRI brain for surveillance on 12/29/22. This showed no new or progressive disease. Stable endolymphatic sacs were noted and chronic ethmoid sinusitis and bilateral mastoid opacification are noted. His sister Brett Campbell is contacted as the patient is deaf and does not have access to ASL phone at home.    PREVIOUS RADIATION THERAPY:   Radiation Treatment Dates: 09/21/2022 through 10/02/2022 Site Technique Total Dose (Gy) Dose per Fx (Gy) Completed Fx Beam Energies  Brain:  Whole Brain Complex 30/30 3 10/10 6X     PAST MEDICAL HISTORY:  Past Medical History:  Diagnosis Date   Calcium deficiency    Cataract    COPD (chronic obstructive pulmonary disease) (HCC)    moderate emphysema   Deaf    Hypertension    Hypothyroidism    Personal history of colonic  polyps 11/03/2020   Pneumonia    August 2023   Scarlet fever    Sleep apnea    never used a Cpap, used to use O2   Thyroid disease    Tuberculosis    [place] department is contact with him due to hx.       PAST SURGICAL HISTORY: Past Surgical History:  Procedure Laterality Date   CHOLECYSTECTOMY     COLONOSCOPY     +5years   IR IMAGING GUIDED PORT INSERTION  08/14/2022   LAPAROSCOPIC RIGHT HEMI COLECTOMY N/A 02/26/2021   Procedure: LAPAROSCOPIC RIGHT HEMI COLECTOMY;  Surgeon: Andria Meuse, MD;  Location: [facility];  Service: General;  Laterality: N/A;   THYROIDECTOMY     VIDEO BRONCHOSCOPY WITH ENDOBRONCHIAL ULTRASOUND N/A 07/24/2022   Procedure: VIDEO BRONCHOSCOPY WITH ENDOBRONCHIAL ULTRASOUND  WITH LEFT LOWER LOBE  MASS BIOPSIES;  Surgeon: Martina Sinner, MD;  Location: [facility];  Service: Pulmonary;  Laterality: N/A;     FAMILY HISTORY:  Family History  Problem Relation Age of Onset   CAD Mother    Diabetes Mellitus II Mother    Diabetes Mother    CAD Father    Heart attack Father    Heart disease Father    Diabetes Sister    Colon cancer Neg Hx    Esophageal cancer Neg Hx    Stomach cancer Neg Hx    Colon polyps Neg Hx      SOCIAL HISTORY:  reports that he has been smoking cigarettes. He has a 11.25 pack-year smoking history. He has never used smokeless tobacco. He reports current alcohol use of about 1.0 - 2.0 standard drink of alcohol per week. He reports current drug use. Drug: Marijuana. The patient is single and lives with his sister Brett Campbell. His sister Brett Campbell lives out of state and joins [place] during the visit by phone. He is deaf and communicates with ASL, and also reads lips. The patient's sister Brett Campbell join [place] today on the call.    ALLERGIES: Penicillins   MEDICATIONS:  Current Outpatient Medications  Medication Sig Dispense Refill   amLODipine (NORVASC) 5 MG tablet Take 1 tablet (5 mg total) by mouth daily. 90 tablet 3   atorvastatin (LIPITOR) 10  MG tablet TAKE 1 TABLET EVERY DAY 90 tablet 3   calcitRIOL (ROCALTROL) 0.25 MCG capsule TAKE 1 CAPSULE EVERY DAY 90 capsule 0   calcium carbonate (TUMS) 500 MG chewable tablet Chew 1 tablet (200 mg of elemental calcium total) by mouth 3 (three) times daily. 90 tablet 3   colestipol (COLESTID) 1 g tablet Take 1 tablet (1 g total) by mouth 2 (two) times daily. (Patient taking differently: Take 1 g by mouth daily.) 60 tablet 5   HYDROcodone bit-homatropine (HYCODAN) 5-1.5 MG/5ML syrup Take 5 mLs by mouth every 6 hours as needed for cough. 240 mL 0   ibuprofen (ADVIL) 200 MG tablet Take 400-800 mg by mouth every 6 (six) hours as needed for moderate pain.     levothyroxine (SYNTHROID) 150 MCG tablet TAKE 1 TABLET EVERY DAY (NEED MD APPOINTMENT) 30 tablet 0   lidocaine-prilocaine (EMLA) cream Apply to affected area once 30 g 3   loperamide (IMODIUM A-D) 2 MG tablet Take 1 tablet (2 mg total) by mouth 4 (four) times daily as needed for diarrhea or loose stools. 30 tablet 0   ondansetron (ZOFRAN) 8 MG tablet Take 1 tablet (8 mg total) by mouth every 8 (eight) hours as needed for nausea, vomiting or refractory nausea / vomiting. Start on the third day after carboplatin. (Patient taking differently: Take 8 mg by mouth every 6 (six) hours as needed for nausea, vomiting or refractory nausea / vomiting. Start on the third day after carboplatin.) 30 tablet 1   potassium chloride SA (KLOR-CON M) 20 MEQ tablet TAKE 1 TABLET EVERY DAY 90 tablet 3   prochlorperazine (COMPAZINE) 10 MG tablet Take 1 tablet (10 mg total) by mouth every 6 (six) hours as needed. (Patient taking differently: Take 10 mg by mouth every 6 (six) hours as needed for nausea or vomiting.) 30 tablet 2   triamcinolone cream (KENALOG) 0.1 % Apply 1 Application topically 2 (two) times daily as needed. 453.6 g 0   No current facility-administered medications for this visit.     REVIEW OF SYSTEMS: On review of systems, the patient's sister states her  brother is doing well. She reports he is starting to have hair growth and some dryness of his scalp and feels like his ear canals are dry. No complaints of headache, visual or speech changes, or difficulties with movement are noted. In fact, his movement has improved.      PHYSICAL EXAM:  Unable to assess due to encounter type     ECOG = 1  0 - Asymptomatic (Fully active, able to carry on all predisease activities without restriction)  1 - Symptomatic but completely ambulatory (Restricted in physically strenuous activity but ambulatory and able to carry out work of  a light or sedentary nature. For example, light housework, office work)  2 - Symptomatic, <50% in bed during the day (Ambulatory and capable of all self care but unable to carry out any work activities. Up and about more than 50% of waking hours)  3 - Symptomatic, >50% in bed, but not bedbound (Capable of only limited self-care, confined to bed or chair 50% or more of waking hours)  4 - Bedbound (Completely disabled. Cannot carry on any self-care. Totally confined to bed or chair)  5 - Death   Brett Campbell MM, Creech RH, Tormey DC, et al. [phone]). "Toxicity and response criteria of the [facility]". Am. Evlyn Clines. Oncol. 5 (6): 649-55    LABORATORY DATA:  Lab Results  Component Value Date   WBC 9.1 12/21/2022   HGB 13.8 12/21/2022   HCT 39.5 12/21/2022   MCV 89.2 12/21/2022   PLT 177 12/21/2022   Lab Results  Component Value Date   NA 140 12/21/2022   K 3.6 12/21/2022   CL 104 12/21/2022   CO2 28 12/21/2022   Lab Results  Component Value Date   ALT 16 12/21/2022   AST 18 12/21/2022   ALKPHOS 69 12/21/2022   BILITOT 0.3 12/21/2022      RADIOGRAPHY: MR Brain W Wo Contrast  Result Date: 01/04/2023 CLINICAL DATA:  Assess treatment response of brain metastases. History of whole-brain radiotherapy EXAM: MRI HEAD WITHOUT AND WITH CONTRAST TECHNIQUE: Multiplanar, multiecho pulse sequences of the  brain and surrounding structures were obtained without and with intravenous contrast. CONTRAST:  7.97mL GADAVIST GADOBUTROL 1 MMOL/ML IV SOLN COMPARISON:  09/10/2022 FINDINGS: Brain: Somewhat difficult comparison with prior current black blood versus prior MP rage postcontrast sequence difference. No suspected progression. Subcentimeter lesions are seen in the bilateral perirolandic frontal lobe, anterior to the frontal horn of the right lateral ventricle, and in the right cerebral peduncle. Somewhat linear and nodular enhancement in the posterior pons is likely stable from the most recent brain MRI and could even be vascular. No brain edema or hemorrhage. No acute infarct, hydrocephalus, or collection. Vascular: Normal flow voids. Skull and upper cervical spine: Normal marrow signal. Enlarged endolymphatic sacs. Sinuses/Orbits: Mastoid opacification on both sides, interval and likely treatment related. Chronic ethmoid sinusitis IMPRESSION: No new or progressive disease when compared to 09/10/2022. Electronically Signed   By: Tiburcio Pea M.D.   On: 01/04/2023 04:17       IMPRESSION/PLAN: 1. 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 The patient is doing well radiographically and clinically according to his sister who is his surrogate decision maker and who has been involved in his care with [place] since his original consultation. We will plan repeat MRI in 3 months. She is in agreement and will share this discussion with [name].  2. Sinusitis and mastoid opacification. The patient's hearing is not of concern at this point given his known history of deafness, but if he develops sensations of fullness or progressive symptoms, we would be happy to refer him back to discuss this with ENT. He can also discuss dry ear canals with his PCP.     This encounter was conducted via telephone.  The patient has provided two factor identification and has given verbal  consent for this type of encounter and has been advised to only accept a meeting of this type in a secure network environment. The time spent during this encounter was 35 minutes including preparation, discussion, and  coordination of the patient's care. The attendants for this meeting include Brett Campbell  and Brett Campbell During the encounter, Brett Campbell was located at [facility] Radiation Oncology Department.  Kaevon Delose Carte was located at home with his sister Brett Campbell.     Osker Mason, [facility]   **Disclaimer: This note was dictated with voice recognition software. Similar sounding words can inadvertently be transcribed and this note may contain transcription errors which may not have been corrected upon publication of note.**

## 2023-01-04 NOTE — Progress Notes (Signed)
Telephone nursing appointment for patient to review most recent MRI results from 12/29/2022. I verified patient's identity and began nursing interview. Patient reports modest fatigue, otherwise doing well. No other issues conveyed at this time.   Meaningful use complete.   Patient aware of their 8:30am-01/05/2023 telephone appointment w/ Laurence Aly PA-C. I left my extension [phone] in case patient needs anything. Patient verbalized understanding. This concludes the nursing interview.   Patient contact [phone]     Ruel Favors, LPN

## 2023-01-05 ENCOUNTER — Other Ambulatory Visit: Payer: Self-pay | Admitting: Radiation Therapy

## 2023-01-05 ENCOUNTER — Ambulatory Visit
Admission: RE | Admit: 2023-01-05 | Discharge: 2023-01-05 | Disposition: A | Discharge status: Home / Self Care | Payer: Medicare HMO | Source: Ambulatory Visit | Attending: Radiation Oncology | Admitting: Radiation Oncology

## 2023-01-05 DIAGNOSIS — C7931 Secondary malignant neoplasm of brain: Secondary | ICD-10-CM

## 2023-01-05 DIAGNOSIS — F17210 Nicotine dependence, cigarettes, uncomplicated: Secondary | ICD-10-CM | POA: Diagnosis not present

## 2023-01-05 DIAGNOSIS — C3490 Malignant neoplasm of unspecified part of unspecified bronchus or lung: Secondary | ICD-10-CM

## 2023-01-05 DIAGNOSIS — C3432 Malignant neoplasm of lower lobe, left bronchus or lung: Secondary | ICD-10-CM | POA: Diagnosis not present

## 2023-01-06 ENCOUNTER — Other Ambulatory Visit: Payer: Self-pay

## 2023-01-06 DIAGNOSIS — C7931 Secondary malignant neoplasm of brain: Secondary | ICD-10-CM | POA: Insufficient documentation

## 2023-01-08 ENCOUNTER — Other Ambulatory Visit: Payer: Self-pay

## 2023-01-12 NOTE — Progress Notes (Signed)
[facility] OFFICE PROGRESS NOTE  [name], NP [address] [name] [address]  DIAGNOSIS: Extensive stage (T4, N3, M1 C) small cell lung cancer presented with obstructing left lower lobe lung mass with left extrapleural lymph node, bilateral adrenal metastasis, pancreatic lesion as well as upper abdominal lymphadenopathy and soft tissue nodules in the abdominal wall as well as the abdomen diagnosed in December 2023.   PRIOR THERAPY: Whole brain irradiation for brain metastasis.   CURRENT THERAPY: Systemic chemotherapy with carboplatin for AUC of 5 on day 1, etoposide 100 Mg/M2 on days 1, 2 and 3 with Cosela 240 Mg/M2 on the days of the chemotherapy and Imfinzi 1500 Mg IV every 3 weeks.  First dose August 10, 2022.  Status post 6 cycles.  Starting from cycle #5 he will be on maintenance treatment with Imfinzi 1500 Mg IV every 4 weeks.   INTERVAL HISTORY: Brett Campbell 62 y.o. male returns to the clinic today for a follow-up visit accompanied by his sister and sign interpreter.  The patient completed 4 cycles of chemotherapy and immunotherapy.  He is currently on maintenance immunotherapy and is tolerating it well overall without any concerning complaints except for some mild rash on his chest with associated itching. He was given kenalog cream at his last appointment which helps the itching. The rash and itching worsens with heat/sweating. Today he denies any fever, chills, night sweats, or unexplained weight loss. His sister states that he is eating well but he does not like water. Denies any chest pain or hemoptysis. She mentions she feels like his cough has changed. He relayed that his breathing is "fine". He has stable dyspnea on exertion. His diarrhea has improved as long as he takes his cholestyramine BID. He denies nausea, vomiting, or diarrhea. Denies any headache or visual changes.  He recently had a restaging CT scan performed.  He is here today  for evaluation and to review his scan results before starting cycle #7.    MEDICAL HISTORY: Past Medical History:  Diagnosis Date   Calcium deficiency    Cataract    COPD (chronic obstructive pulmonary disease) (HCC)    moderate emphysema   Deaf    Hypertension    Hypothyroidism    Personal history of colonic polyps 11/03/2020   Pneumonia    August 2023   Scarlet fever    Sleep apnea    never used a Cpap, used to use O2   Thyroid disease    Tuberculosis    [place] department is contact with him due to hx.    ALLERGIES:  is allergic to penicillins.  MEDICATIONS:  Current Outpatient Medications  Medication Sig Dispense Refill   amLODipine (NORVASC) 5 MG tablet Take 1 tablet (5 mg total) by mouth daily. 90 tablet 3   atorvastatin (LIPITOR) 10 MG tablet TAKE 1 TABLET EVERY DAY 90 tablet 3   calcitRIOL (ROCALTROL) 0.25 MCG capsule TAKE 1 CAPSULE EVERY DAY 90 capsule 0   calcium carbonate (TUMS) 500 MG chewable tablet Chew 1 tablet (200 mg of elemental calcium total) by mouth 3 (three) times daily. 90 tablet 3   colestipol (COLESTID) 1 g tablet Take 1 tablet (1 g total) by mouth 2 (two) times daily. (Patient taking differently: Take 1 g by mouth daily.) 60 tablet 5   HYDROcodone bit-homatropine (HYCODAN) 5-1.5 MG/5ML syrup Take 5 mLs by mouth every 6 hours as needed for cough. 240 mL 0   ibuprofen (  ADVIL) 200 MG tablet Take 400-800 mg by mouth every 6 (six) hours as needed for moderate pain.     levothyroxine (SYNTHROID) 150 MCG tablet TAKE 1 TABLET EVERY DAY (NEED MD APPOINTMENT) 30 tablet 0   lidocaine-prilocaine (EMLA) cream Apply to affected area once 30 g 3   loperamide (IMODIUM A-D) 2 MG tablet Take 1 tablet (2 mg total) by mouth 4 (four) times daily as needed for diarrhea or loose stools. 30 tablet 0   ondansetron (ZOFRAN) 8 MG tablet Take 1 tablet (8 mg total) by mouth every 8 (eight) hours as needed for nausea, vomiting or refractory nausea / vomiting. Start on the  third day after carboplatin. (Patient taking differently: Take 8 mg by mouth every 6 (six) hours as needed for nausea, vomiting or refractory nausea / vomiting. Start on the third day after carboplatin.) 30 tablet 1   potassium chloride SA (KLOR-CON M) 20 MEQ tablet TAKE 1 TABLET EVERY DAY 90 tablet 3   prochlorperazine (COMPAZINE) 10 MG tablet Take 1 tablet (10 mg total) by mouth every 6 (six) hours as needed. (Patient taking differently: Take 10 mg by mouth every 6 (six) hours as needed for nausea or vomiting.) 30 tablet 2   triamcinolone cream (KENALOG) 0.1 % Apply 1 Application topically 2 (two) times daily as needed. 453.6 g 0   No current facility-administered medications for this visit.    SURGICAL HISTORY:  Past Surgical History:  Procedure Laterality Date   CHOLECYSTECTOMY     COLONOSCOPY     +5years   IR IMAGING GUIDED PORT INSERTION  08/14/2022   LAPAROSCOPIC RIGHT HEMI COLECTOMY N/A 02/26/2021   Procedure: LAPAROSCOPIC RIGHT HEMI COLECTOMY;  Surgeon: Andria Meuse, MD;  Location: [facility];  Service: General;  Laterality: N/A;   THYROIDECTOMY     VIDEO BRONCHOSCOPY WITH ENDOBRONCHIAL ULTRASOUND N/A 07/24/2022   Procedure: VIDEO BRONCHOSCOPY WITH ENDOBRONCHIAL ULTRASOUND  WITH LEFT LOWER LOBE  MASS BIOPSIES;  Surgeon: Martina Sinner, MD;  Location: [facility];  Service: Pulmonary;  Laterality: N/A;    REVIEW OF SYSTEMS:   Review of Systems  Constitutional: Negative for appetite change, chills, fatigue, fever and unexpected weight change.  HENT: Negative for mouth sores, nosebleeds, sore throat and trouble swallowing.   Eyes: Negative for eye problems and icterus.  Respiratory: Positive for stable dyspnea on exertion. Positive for changed cough. Negative for hemoptysis and wheezing.   Cardiovascular: Negative for chest pain and leg swelling.  Gastrointestinal: Negative for abdominal pain, constipation, diarrhea (improved), nausea and vomiting.  Genitourinary: Negative for  bladder incontinence, difficulty urinating, dysuria, frequency and hematuria.   Musculoskeletal: Negative for back pain, gait problem, neck pain and neck stiffness.  Skin:Positive for rash/itching on chest.  Neurological: Negative for dizziness, extremity weakness, gait problem, headaches, light-headedness and seizures.  Hematological: Negative for adenopathy. Does not bruise/bleed easily.  Psychiatric/Behavioral: Negative for confusion, depression and sleep disturbance. The patient is not nervous/anxious.   PHYSICAL EXAMINATION:  Blood pressure 124/68, pulse 68, temperature 97.8 F (36.6 C), temperature source Oral, resp. rate 16, weight 156 lb 6.4 oz (70.9 kg), SpO2 97 %.  ECOG PERFORMANCE STATUS: 1  Physical Exam  Constitutional: Oriented to person, place, and time and well-developed, well-nourished, and in no distress.  HENT:  Head: Normocephalic and atraumatic. Positive for deafness.  Mouth/Throat: Oropharynx is clear and moist. No oropharyngeal exudate.  Eyes: Conjunctivae are normal. Right eye exhibits no discharge. Left eye exhibits no discharge. No scleral icterus.  Neck: Normal range  of motion. Neck supple.  Cardiovascular: Normal rate, regular rhythm, normal heart sounds and intact distal pulses.   Pulmonary/Chest: Effort normal and breath sounds normal. No respiratory distress. No wheezes. No rales.  Abdominal: Soft. Bowel sounds are normal. Exhibits no distension and no mass. There is no tenderness.  Musculoskeletal: Normal range of motion. Exhibits no edema.  Lymphadenopathy:    No cervical adenopathy.  Neurological: Alert and oriented to person, place, and time. Exhibits normal muscle tone. Gait normal. Coordination normal.  Skin: Skin is warm and dry. Positive for rash on chest. Not diaphoretic. No erythema. No pallor.  Psychiatric: Mood, memory and judgment normal.  Vitals reviewed.  LABORATORY DATA: Lab Results  Component Value Date   WBC 8.8 01/19/2023   HGB  14.3 01/19/2023   HCT 40.1 01/19/2023   MCV 87.9 01/19/2023   PLT 173 01/19/2023      Chemistry      Component Value Date/Time   NA 140 12/21/2022 0940   NA 139 04/28/2022 1038   K 3.6 12/21/2022 0940   CL 104 12/21/2022 0940   CO2 28 12/21/2022 0940   BUN 16 12/21/2022 0940   BUN 14 04/28/2022 1038   CREATININE 1.12 12/21/2022 0940      Component Value Date/Time   CALCIUM 8.8 (L) 12/21/2022 0940   ALKPHOS 69 12/21/2022 0940   AST 18 12/21/2022 0940   ALT 16 12/21/2022 0940   BILITOT 0.3 12/21/2022 0940       RADIOGRAPHIC STUDIES:  MR Brain W Wo Contrast  Result Date: 01/04/2023 CLINICAL DATA:  Assess treatment response of brain metastases. History of whole-brain radiotherapy EXAM: MRI HEAD WITHOUT AND WITH CONTRAST TECHNIQUE: Multiplanar, multiecho pulse sequences of the brain and surrounding structures were obtained without and with intravenous contrast. CONTRAST:  7.74mL GADAVIST GADOBUTROL 1 MMOL/ML IV SOLN COMPARISON:  09/10/2022 FINDINGS: Brain: Somewhat difficult comparison with prior current black blood versus prior MP rage postcontrast sequence difference. No suspected progression. Subcentimeter lesions are seen in the bilateral perirolandic frontal lobe, anterior to the frontal horn of the right lateral ventricle, and in the right cerebral peduncle. Somewhat linear and nodular enhancement in the posterior pons is likely stable from the most recent brain MRI and could even be vascular. No brain edema or hemorrhage. No acute infarct, hydrocephalus, or collection. Vascular: Normal flow voids. Skull and upper cervical spine: Normal marrow signal. Enlarged endolymphatic sacs. Sinuses/Orbits: Mastoid opacification on both sides, interval and likely treatment related. Chronic ethmoid sinusitis IMPRESSION: No new or progressive disease when compared to 09/10/2022. Electronically Signed   By: Tiburcio Pea M.D.   On: 01/04/2023 04:17     ASSESSMENT/PLAN:  This is a very pleasant  62 year old Caucasian male with Extensive stage (T4, N3, M1 C) small cell lung cancer presented with obstructing left lower lobe lung mass with left extrapleural lymph node, bilateral adrenal metastasis, pancreatic lesion as well as upper abdominal lymphadenopathy and soft tissue nodules in the abdominal wall as well as the abdomen diagnosed in December 2023.    He is undergoing treatment palliative systemic chemotherapy with carboplatin for an AUC of 5 on day 1, etoposide 100 mg/m on days 1, 2, and 3 and Imfinzi 5000 mg IV every 3 weeks.  Starting from cycle #5, he started maintenance immunotherapy with Imfinzi 1500 mg single agent every 4 weeks.  He is status post 6 cycles total.  The patient recently had a restaging CT scan performed. The scan report by radiology is not available  at this time. The patient was seen with Dr. Arbutus Ped today.  Dr. Arbutus Ped personally independently reviewed the scan discussed results with the patient today. Dr. Arbutus Ped did not see any evidence of disease progression. Of course, we will wait for the radiology overread.   For now, he will proceed with cycle #7. If the scan shows concerning findings, then we will call him back sooner.   Dr. Arbutus Ped recommended he proceed with cycle #7 today as scheduled.    We will see him back for follow-up visit in 4 weeks for evaluation repeat blood work before undergoing cycle #8.  He will continue cholestyramine for diarrhea, which is better at this time.   He will continue the kenalog cream for the rash and itching. He will also avoid heat/sweating as that is exacerbating the rash. Discussed if he developed significant rash/worsening rash, then we would consider high dose steroids. However, the rash is mild at this time so recommend close monitoring.  He was encouraged to increase his calcium rich food for his low calcium.   The patient was advised to call immediately if he has any concerning symptoms in the interval. The patient  voices understanding of current disease status and treatment options and is in agreement with the current care plan. All questions were answered. The patient knows to call the clinic with any problems, questions or concerns. We can certainly see the patient much sooner if necessary  No orders of the defined types were placed in this encounter.    Lucine Bilski L Kutler Vanvranken, PA-C 01/19/23  ADDENDUM: Hematology/Oncology Attending: I had a face-to-face encounter with the patient today.  I reviewed his record, lab, scan and recommended his care plan.  This is a very pleasant 62 years old white male with extensive stage small cell lung cancer diagnosed in December 2023 status post whole brain irradiation for brain metastasis.  The patient started systemic chemotherapy initially with carboplatin, etoposide, Cosela and Imfinzi for 4 cycles and currently on maintenance treatment with single agent Imfinzi for 2 more cycles.  He has been tolerating this treatment well with no concerning adverse effect except for occasional fatigue.  He denied having any significant chest pain or shortness of breath. The patient had repeat CT scan of the chest, abdomen and pelvis performed recently.  I personally and independently reviewed the scan and discussed the result with the patient and his sister.  The result of the scan were not available at the time of the visit but on my review of the images I did not see any concerning findings for disease progression and this was later confirmed with the final report. I recommended for the patient to continue his current treatment with maintenance Imfinzi and he will proceed with cycle #7 today. He will come back for follow-up visit in 4 weeks for evaluation before the next cycle of his treatment. He was advised to call immediately if he has any other concerning symptoms in the interval. The total time spent in the appointment was 30 minutes. Disclaimer: This note was dictated with  voice recognition software. Similar sounding words can inadvertently be transcribed and may be missed upon review. Lajuana Matte, MD

## 2023-01-15 ENCOUNTER — Ambulatory Visit (HOSPITAL_COMMUNITY)
Admission: RE | Admit: 2023-01-15 | Discharge: 2023-01-15 | Disposition: A | Discharge status: Home / Self Care | Payer: Medicare HMO | Source: Ambulatory Visit | Attending: Physician Assistant | Admitting: Physician Assistant

## 2023-01-15 ENCOUNTER — Inpatient Hospital Stay: Payer: Medicare HMO | Attending: Physician Assistant

## 2023-01-15 DIAGNOSIS — Z79620 Long term (current) use of immunosuppressive biologic: Secondary | ICD-10-CM | POA: Insufficient documentation

## 2023-01-15 DIAGNOSIS — C3432 Malignant neoplasm of lower lobe, left bronchus or lung: Secondary | ICD-10-CM | POA: Insufficient documentation

## 2023-01-15 DIAGNOSIS — C7972 Secondary malignant neoplasm of left adrenal gland: Secondary | ICD-10-CM | POA: Insufficient documentation

## 2023-01-15 DIAGNOSIS — R918 Other nonspecific abnormal finding of lung field: Secondary | ICD-10-CM | POA: Diagnosis not present

## 2023-01-15 DIAGNOSIS — C7931 Secondary malignant neoplasm of brain: Secondary | ICD-10-CM | POA: Insufficient documentation

## 2023-01-15 DIAGNOSIS — Z5112 Encounter for antineoplastic immunotherapy: Secondary | ICD-10-CM | POA: Insufficient documentation

## 2023-01-15 DIAGNOSIS — J439 Emphysema, unspecified: Secondary | ICD-10-CM | POA: Diagnosis not present

## 2023-01-15 DIAGNOSIS — C7971 Secondary malignant neoplasm of right adrenal gland: Secondary | ICD-10-CM | POA: Insufficient documentation

## 2023-01-15 DIAGNOSIS — N289 Disorder of kidney and ureter, unspecified: Secondary | ICD-10-CM | POA: Diagnosis not present

## 2023-01-15 DIAGNOSIS — C3490 Malignant neoplasm of unspecified part of unspecified bronchus or lung: Secondary | ICD-10-CM | POA: Diagnosis not present

## 2023-01-15 MED ORDER — HEPARIN SOD (PORK) LOCK FLUSH 100 UNIT/ML IV SOLN
500.0000 [IU] | Freq: Once | INTRAVENOUS | Status: AC
  Administered 2023-01-15: 500 [IU] via INTRAVENOUS

## 2023-01-15 MED ORDER — HEPARIN SOD (PORK) LOCK FLUSH 100 UNIT/ML IV SOLN
INTRAVENOUS | Status: AC
  Filled 2023-01-15: qty 5

## 2023-01-15 MED ORDER — IOHEXOL 300 MG/ML  SOLN
100.0000 mL | Freq: Once | INTRAMUSCULAR | Status: AC | PRN
  Administered 2023-01-15: 100 mL via INTRAVENOUS

## 2023-01-15 MED ORDER — SODIUM CHLORIDE (PF) 0.9 % IJ SOLN
INTRAMUSCULAR | Status: AC
  Filled 2023-01-15: qty 50

## 2023-01-19 ENCOUNTER — Inpatient Hospital Stay (HOSPITAL_BASED_OUTPATIENT_CLINIC_OR_DEPARTMENT_OTHER): Payer: Medicare HMO | Admitting: Physician Assistant

## 2023-01-19 ENCOUNTER — Inpatient Hospital Stay: Payer: Medicare HMO

## 2023-01-19 VITALS — BP 124/68 | HR 68 | Temp 97.8°F | Resp 16 | Wt 156.4 lb

## 2023-01-19 DIAGNOSIS — Z5112 Encounter for antineoplastic immunotherapy: Secondary | ICD-10-CM | POA: Diagnosis not present

## 2023-01-19 DIAGNOSIS — Z95828 Presence of other vascular implants and grafts: Secondary | ICD-10-CM

## 2023-01-19 DIAGNOSIS — C7972 Secondary malignant neoplasm of left adrenal gland: Secondary | ICD-10-CM | POA: Diagnosis not present

## 2023-01-19 DIAGNOSIS — C3432 Malignant neoplasm of lower lobe, left bronchus or lung: Secondary | ICD-10-CM | POA: Diagnosis not present

## 2023-01-19 DIAGNOSIS — C3490 Malignant neoplasm of unspecified part of unspecified bronchus or lung: Secondary | ICD-10-CM | POA: Diagnosis not present

## 2023-01-19 DIAGNOSIS — Z79620 Long term (current) use of immunosuppressive biologic: Secondary | ICD-10-CM | POA: Diagnosis not present

## 2023-01-19 DIAGNOSIS — C7971 Secondary malignant neoplasm of right adrenal gland: Secondary | ICD-10-CM | POA: Diagnosis not present

## 2023-01-19 DIAGNOSIS — C7931 Secondary malignant neoplasm of brain: Secondary | ICD-10-CM | POA: Diagnosis not present

## 2023-01-19 LAB — CMP (CANCER CENTER ONLY)
ALT: 15 U/L (ref 0–44)
AST: 17 U/L (ref 15–41)
Albumin: 4.2 g/dL (ref 3.5–5.0)
Alkaline Phosphatase: 62 U/L (ref 38–126)
Anion gap: 6 (ref 5–15)
BUN: 16 mg/dL (ref 6–20)
CO2: 28 mmol/L (ref 22–32)
Calcium: 8.2 mg/dL — ABNORMAL LOW (ref 8.9–10.3)
Chloride: 105 mmol/L (ref 98–111)
Creatinine: 1.18 mg/dL (ref 0.61–1.24)
GFR, Estimated: >60 mL/min (ref >60)
Glucose, Bld: 86 mg/dL (ref 70–99)
Potassium: 3.9 mmol/L (ref 3.5–5.1)
Sodium: 139 mmol/L (ref 135–145)
Total Bilirubin: 0.4 mg/dL (ref 0.3–1.2)
Total Protein: 6.8 g/dL (ref 6.5–8.1)

## 2023-01-19 LAB — CBC WITH DIFFERENTIAL (CANCER CENTER ONLY)
Abs Immature Granulocytes: 0.03 10*3/uL (ref 0.00–0.07)
Basophils Absolute: 0.1 10*3/uL (ref 0.0–0.1)
Basophils Relative: 1 %
Eosinophils Absolute: 0.4 10*3/uL (ref 0.0–0.5)
Eosinophils Relative: 5 %
HCT: 40.1 % (ref 39.0–52.0)
Hemoglobin: 14.3 g/dL (ref 13.0–17.0)
Immature Granulocytes: 0 %
Lymphocytes Relative: 20 %
Lymphs Abs: 1.8 10*3/uL (ref 0.7–4.0)
MCH: 31.4 pg (ref 26.0–34.0)
MCHC: 35.7 g/dL (ref 30.0–36.0)
MCV: 87.9 fL (ref 80.0–100.0)
Monocytes Absolute: 0.7 10*3/uL (ref 0.1–1.0)
Monocytes Relative: 8 %
Neutro Abs: 5.8 10*3/uL (ref 1.7–7.7)
Neutrophils Relative %: 66 %
Platelet Count: 173 10*3/uL (ref 150–400)
RBC: 4.56 MIL/uL (ref 4.22–5.81)
RDW: 12.1 % (ref 11.5–15.5)
WBC Count: 8.8 10*3/uL (ref 4.0–10.5)
nRBC: 0 % (ref 0.0–0.2)

## 2023-01-19 MED ORDER — HEPARIN SOD (PORK) LOCK FLUSH 100 UNIT/ML IV SOLN
500.0000 [IU] | Freq: Once | INTRAVENOUS | Status: AC | PRN
  Administered 2023-01-19: 500 [IU]

## 2023-01-19 MED ORDER — SODIUM CHLORIDE 0.9% FLUSH
10.0000 mL | Freq: Once | INTRAVENOUS | Status: AC
  Administered 2023-01-19: 10 mL

## 2023-01-19 MED ORDER — SODIUM CHLORIDE 0.9% FLUSH
10.0000 mL | INTRAVENOUS | Status: DC | PRN
  Administered 2023-01-19: 10 mL

## 2023-01-19 MED ORDER — SODIUM CHLORIDE 0.9 % IV SOLN
1500.0000 mg | Freq: Once | INTRAVENOUS | Status: AC
  Administered 2023-01-19: 1500 mg via INTRAVENOUS
  Filled 2023-01-19: qty 30

## 2023-01-19 MED ORDER — SODIUM CHLORIDE 0.9 % IV SOLN: Freq: Once | INTRAVENOUS | Status: AC

## 2023-01-19 NOTE — Patient Instructions (Signed)
[facility]  Discharge Instructions: Thank you for choosing [facility] to provide your oncology and hematology care.   If you have a lab appointment with the Cancer Center, please go directly to the Cancer Center and check in at the registration area.   Wear comfortable clothing and clothing appropriate for easy access to any Portacath or PICC line.   We strive to give you quality time with your provider. You may need to reschedule your appointment if you arrive late (15 or more minutes).  Arriving late affects you and other patients whose appointments are after yours.  Also, if you miss three or more appointments without notifying the office, you may be dismissed from the clinic at the provider's discretion.      For prescription refill requests, have your pharmacy contact our office and allow 72 hours for refills to be completed.    Today you received the following chemotherapy and/or immunotherapy agents: durvalumab        To help prevent nausea and vomiting after your treatment, we encourage you to take your nausea medication as directed.  BELOW ARE SYMPTOMS THAT SHOULD BE REPORTED IMMEDIATELY: *FEVER GREATER THAN 100.4 F (38 C) OR HIGHER *CHILLS OR SWEATING *NAUSEA AND VOMITING THAT IS NOT CONTROLLED WITH YOUR NAUSEA MEDICATION *UNUSUAL SHORTNESS OF BREATH *UNUSUAL BRUISING OR BLEEDING *URINARY PROBLEMS (pain or burning when urinating, or frequent urination) *BOWEL PROBLEMS (unusual diarrhea, constipation, pain near the anus) TENDERNESS IN MOUTH AND THROAT WITH OR WITHOUT PRESENCE OF ULCERS (sore throat, sores in mouth, or a toothache) UNUSUAL RASH, SWELLING OR PAIN  UNUSUAL VAGINAL DISCHARGE OR ITCHING   Items with * indicate a potential emergency and should be followed up as soon as possible or go to the Emergency Department if any problems should occur.  Please show the CHEMOTHERAPY ALERT CARD or IMMUNOTHERAPY ALERT CARD at  check-in to the Emergency Department and triage nurse.  Should you have questions after your visit or need to cancel or reschedule your appointment, please contact [facility]  Dept: [phone]  and follow the prompts.  Office hours are 8:00 a.m. to 4:30 p.m. Monday - Friday. Please note that voicemails left after 4:00 p.m. may not be returned until the following business day.  We are closed weekends and major holidays. You have access to a nurse at all times for urgent questions. Please call the main number to the clinic Dept: [phone] and follow the prompts.   For any non-urgent questions, you may also contact your provider using MyChart. We now offer e-Visits for anyone 18 and older to request care online for non-urgent symptoms. For details visit [email or web address].   Also download the MyChart app! Go to the app store, search "MyChart", open the app, select [facility], and log in with your MyChart username and password.   

## 2023-01-21 ENCOUNTER — Other Ambulatory Visit: Payer: Self-pay | Admitting: Internal Medicine

## 2023-02-13 ENCOUNTER — Other Ambulatory Visit: Payer: Self-pay | Admitting: Internal Medicine

## 2023-02-15 ENCOUNTER — Inpatient Hospital Stay: Payer: Medicare HMO

## 2023-02-15 ENCOUNTER — Other Ambulatory Visit: Payer: Self-pay | Admitting: Internal Medicine

## 2023-02-15 ENCOUNTER — Inpatient Hospital Stay (HOSPITAL_BASED_OUTPATIENT_CLINIC_OR_DEPARTMENT_OTHER): Payer: Medicare HMO | Admitting: Internal Medicine

## 2023-02-15 ENCOUNTER — Other Ambulatory Visit: Payer: Self-pay

## 2023-02-15 ENCOUNTER — Inpatient Hospital Stay: Payer: Medicare HMO | Attending: Physician Assistant

## 2023-02-15 DIAGNOSIS — C3490 Malignant neoplasm of unspecified part of unspecified bronchus or lung: Secondary | ICD-10-CM

## 2023-02-15 DIAGNOSIS — C7971 Secondary malignant neoplasm of right adrenal gland: Secondary | ICD-10-CM | POA: Diagnosis not present

## 2023-02-15 DIAGNOSIS — Z5112 Encounter for antineoplastic immunotherapy: Secondary | ICD-10-CM | POA: Insufficient documentation

## 2023-02-15 DIAGNOSIS — C3432 Malignant neoplasm of lower lobe, left bronchus or lung: Secondary | ICD-10-CM | POA: Insufficient documentation

## 2023-02-15 DIAGNOSIS — Z95828 Presence of other vascular implants and grafts: Secondary | ICD-10-CM

## 2023-02-15 DIAGNOSIS — C7972 Secondary malignant neoplasm of left adrenal gland: Secondary | ICD-10-CM | POA: Diagnosis not present

## 2023-02-15 DIAGNOSIS — C7931 Secondary malignant neoplasm of brain: Secondary | ICD-10-CM | POA: Diagnosis not present

## 2023-02-15 LAB — CMP (CANCER CENTER ONLY)
ALT: 15 U/L (ref 0–44)
AST: 19 U/L (ref 15–41)
Albumin: 4 g/dL (ref 3.5–5.0)
Alkaline Phosphatase: 62 U/L (ref 38–126)
Anion gap: 6 (ref 5–15)
BUN: 19 mg/dL (ref 8–23)
CO2: 28 mmol/L (ref 22–32)
Calcium: 8.2 mg/dL — ABNORMAL LOW (ref 8.9–10.3)
Chloride: 103 mmol/L (ref 98–111)
Creatinine: 1.24 mg/dL (ref 0.61–1.24)
GFR, Estimated: >60 mL/min (ref >60)
Glucose, Bld: 89 mg/dL (ref 70–99)
Potassium: 3.9 mmol/L (ref 3.5–5.1)
Sodium: 137 mmol/L (ref 135–145)
Total Bilirubin: 0.6 mg/dL (ref 0.3–1.2)
Total Protein: 6.6 g/dL (ref 6.5–8.1)

## 2023-02-15 LAB — CBC WITH DIFFERENTIAL (CANCER CENTER ONLY)
Abs Immature Granulocytes: 0.03 10*3/uL (ref 0.00–0.07)
Basophils Absolute: 0.1 10*3/uL (ref 0.0–0.1)
Basophils Relative: 1 %
Eosinophils Absolute: 0.5 10*3/uL (ref 0.0–0.5)
Eosinophils Relative: 5 %
HCT: 39.5 % (ref 39.0–52.0)
Hemoglobin: 14.1 g/dL (ref 13.0–17.0)
Immature Granulocytes: 0 %
Lymphocytes Relative: 21 %
Lymphs Abs: 1.9 10*3/uL (ref 0.7–4.0)
MCH: 31.5 pg (ref 26.0–34.0)
MCHC: 35.7 g/dL (ref 30.0–36.0)
MCV: 88.2 fL (ref 80.0–100.0)
Monocytes Absolute: 0.8 10*3/uL (ref 0.1–1.0)
Monocytes Relative: 9 %
Neutro Abs: 5.7 10*3/uL (ref 1.7–7.7)
Neutrophils Relative %: 64 %
Platelet Count: 166 10*3/uL (ref 150–400)
RBC: 4.48 MIL/uL (ref 4.22–5.81)
RDW: 12.3 % (ref 11.5–15.5)
WBC Count: 9 10*3/uL (ref 4.0–10.5)
nRBC: 0 % (ref 0.0–0.2)

## 2023-02-15 MED ORDER — HEPARIN SOD (PORK) LOCK FLUSH 100 UNIT/ML IV SOLN
500.0000 [IU] | Freq: Once | INTRAVENOUS | Status: AC | PRN
  Administered 2023-02-15: 500 [IU]

## 2023-02-15 MED ORDER — SODIUM CHLORIDE 0.9% FLUSH
10.0000 mL | INTRAVENOUS | Status: DC | PRN
  Administered 2023-02-15: 10 mL

## 2023-02-15 MED ORDER — SODIUM CHLORIDE 0.9% FLUSH
10.0000 mL | Freq: Once | INTRAVENOUS | Status: AC
  Administered 2023-02-15: 10 mL

## 2023-02-15 MED ORDER — SODIUM CHLORIDE 0.9 % IV SOLN: Freq: Once | INTRAVENOUS | Status: AC

## 2023-02-15 MED ORDER — SODIUM CHLORIDE 0.9 % IV SOLN
1500.0000 mg | Freq: Once | INTRAVENOUS | Status: AC
  Administered 2023-02-15: 1500 mg via INTRAVENOUS
  Filled 2023-02-15: qty 30

## 2023-02-15 NOTE — Progress Notes (Signed)
[facility] Telephone:(336) [phone]   [phone]  OFFICE PROGRESS NOTE  [name], NP [address] [place] [address]  DIAGNOSIS: Extensive stage (T4, N3, M1 C) small cell lung cancer presented with obstructing left lower lobe lung mass with left extrapleural lymph node, bilateral adrenal metastasis, pancreatic lesion as well as upper abdominal lymphadenopathy and soft tissue nodules in the abdominal wall as well as the abdomen diagnosed in December 2023.  PRIOR THERAPY: Whole brain irradiation for brain metastasis.  CURRENT THERAPY: Systemic chemotherapy with carboplatin for AUC of 5 on day 1, etoposide 100 Mg/M2 on days 1, 2 and 3 with Cosela 240 Mg/M2 on the days of the chemotherapy and Imfinzi 1500 Mg IV every 3 weeks.  First dose August 10, 2022.  Status post 7 cycles.  Starting from cycle #5 he will be on maintenance treatment with Imfinzi 1500 Mg IV every 4 weeks.  INTERVAL HISTORY: Brett Campbell 62 y.o. male returns to the clinic today for follow-up visit accompanied by his sister and his side interpreter.  The patient is feeling fine today with no concerning complaints except for fatigue.  He went to the music festival this weekend and stayed outside for almost 12 hours.  He was a little bit more fatigued after he came back home.  He denied having any chest pain, shortness of breath but has mild cough with no hemoptysis.  He has no nausea, vomiting, diarrhea or constipation.  He has no headache or visual changes.  He has no recent weight loss or night sweats.  He is here today for evaluation before starting cycle #8.  MEDICAL HISTORY: Past Medical History:  Diagnosis Date   Calcium deficiency    Cataract    COPD (chronic obstructive pulmonary disease) (HCC)    moderate emphysema   Deaf    Hypertension    Hypothyroidism    Personal history of colonic polyps 11/03/2020   Pneumonia    August 2023   Scarlet fever     Sleep apnea    never used a Cpap, used to use O2   Thyroid disease    Tuberculosis    [facility] is contact with him due to hx.    ALLERGIES:  is allergic to penicillins.  MEDICATIONS:  Current Outpatient Medications  Medication Sig Dispense Refill   amLODipine (NORVASC) 5 MG tablet Take 1 tablet (5 mg total) by mouth daily. 90 tablet 3   atorvastatin (LIPITOR) 10 MG tablet TAKE 1 TABLET EVERY DAY 90 tablet 3   calcitRIOL (ROCALTROL) 0.25 MCG capsule TAKE 1 CAPSULE EVERY DAY 30 capsule 0   calcium carbonate (TUMS) 500 MG chewable tablet Chew 1 tablet (200 mg of elemental calcium total) by mouth 3 (three) times daily. 90 tablet 3   colestipol (COLESTID) 1 g tablet Take 1 tablet (1 g total) by mouth 2 (two) times daily. (Patient taking differently: Take 1 g by mouth daily.) 60 tablet 5   HYDROcodone bit-homatropine (HYCODAN) 5-1.5 MG/5ML syrup Take 5 mLs by mouth every 6 hours as needed for cough. 240 mL 0   ibuprofen (ADVIL) 200 MG tablet Take 400-800 mg by mouth every 6 (six) hours as needed for moderate pain.     levothyroxine (SYNTHROID) 150 MCG tablet TAKE 1 TABLET EVERY DAY (NEED MD APPOINTMENT) 30 tablet 0   lidocaine-prilocaine (EMLA) cream Apply to affected area once 30 g 3   loperamide (IMODIUM A-D) 2 MG  tablet Take 1 tablet (2 mg total) by mouth 4 (four) times daily as needed for diarrhea or loose stools. 30 tablet 0   ondansetron (ZOFRAN) 8 MG tablet Take 1 tablet (8 mg total) by mouth every 8 (eight) hours as needed for nausea, vomiting or refractory nausea / vomiting. Start on the third day after carboplatin. (Patient taking differently: Take 8 mg by mouth every 6 (six) hours as needed for nausea, vomiting or refractory nausea / vomiting. Start on the third day after carboplatin.) 30 tablet 1   potassium chloride SA (KLOR-CON M) 20 MEQ tablet TAKE 1 TABLET EVERY DAY 90 tablet 3   prochlorperazine (COMPAZINE) 10 MG tablet Take 1 tablet (10 mg total) by mouth every 6  (six) hours as needed. (Patient taking differently: Take 10 mg by mouth every 6 (six) hours as needed for nausea or vomiting.) 30 tablet 2   triamcinolone cream (KENALOG) 0.1 % Apply 1 Application topically 2 (two) times daily as needed. 453.6 g 0   No current facility-administered medications for this visit.    SURGICAL HISTORY:  Past Surgical History:  Procedure Laterality Date   CHOLECYSTECTOMY     COLONOSCOPY     +5years   IR IMAGING GUIDED PORT INSERTION  08/14/2022   LAPAROSCOPIC RIGHT HEMI COLECTOMY N/A 02/26/2021   Procedure: LAPAROSCOPIC RIGHT HEMI COLECTOMY;  Surgeon: Andria Meuse, MD;  Location: [facility];  Service: General;  Laterality: N/A;   THYROIDECTOMY     VIDEO BRONCHOSCOPY WITH ENDOBRONCHIAL ULTRASOUND N/A 07/24/2022   Procedure: VIDEO BRONCHOSCOPY WITH ENDOBRONCHIAL ULTRASOUND  WITH LEFT LOWER LOBE  MASS BIOPSIES;  Surgeon: Martina Sinner, MD;  Location: [facility];  Service: Pulmonary;  Laterality: N/A;    REVIEW OF SYSTEMS:  A comprehensive review of systems was negative except for: Constitutional: positive for fatigue Respiratory: positive for cough   PHYSICAL EXAMINATION: General appearance: alert, cooperative, and fatigued Head: Normocephalic, without obvious abnormality, atraumatic Neck: no adenopathy, no JVD, supple, symmetrical, trachea midline, and thyroid not enlarged, symmetric, no tenderness/mass/nodules Lymph nodes: Cervical, supraclavicular, and axillary nodes normal. Resp: clear to auscultation bilaterally Back: symmetric, no curvature. ROM normal. No CVA tenderness. Cardio: regular rate and rhythm, S1, S2 normal, no murmur, click, rub or gallop GI: soft, non-tender; bowel sounds normal; no masses,  no organomegaly Extremities: extremities normal, atraumatic, no cyanosis or edema  ECOG PERFORMANCE STATUS: 1 - Symptomatic but completely ambulatory  Blood pressure 118/73, pulse (!) 56, temperature (!) 97.3 F (36.3 C), temperature source Oral,  resp. rate 18, height 5\' 10"  (1.778 m), weight 156 lb 8 oz (71 kg), SpO2 98 %.  LABORATORY DATA: Lab Results  Component Value Date   WBC 9.0 02/15/2023   HGB 14.1 02/15/2023   HCT 39.5 02/15/2023   MCV 88.2 02/15/2023   PLT 166 02/15/2023      Chemistry      Component Value Date/Time   NA 139 01/19/2023 0750   NA 139 04/28/2022 1038   K 3.9 01/19/2023 0750   CL 105 01/19/2023 0750   CO2 28 01/19/2023 0750   BUN 16 01/19/2023 0750   BUN 14 04/28/2022 1038   CREATININE 1.18 01/19/2023 0750      Component Value Date/Time   CALCIUM 8.2 (L) 01/19/2023 0750   ALKPHOS 62 01/19/2023 0750   AST 17 01/19/2023 0750   ALT 15 01/19/2023 0750   BILITOT 0.4 01/19/2023 0750       RADIOGRAPHIC STUDIES: No results found.  ASSESSMENT AND PLAN: This  is a very pleasant 62 years old white male with extensive stage (T4, N3, M1 C) small cell lung cancer presented with obstructing left lower lobe lung mass with left extrapleural lymph node, bilateral adrenal metastasis, pancreatic lesion as well as upper abdominal lymphadenopathy and soft tissue nodules in the abdominal wall as well as the abdomen diagnosed in December 2023. The patient started the first cycle of his systemic chemotherapy with carboplatin for AUC of 5 on day 1 and etoposide 100 Mg/M2 on days 1, 2 and 3 with Cosela before the chemotherapy and Imfinzi 1500 Mg IV on day 1 every 3 weeks.  Status post 7 cycle given on August 10, 2022.  Starting from cycle #5 he will be on maintenance treatment with Imfinzi every 4 weeks. The patient has been tolerating this treatment well with no concerning adverse effect except for mild fatigue. I recommended for him to proceed with cycle #8 today as planned. I will see him back for follow-up visit in 4 weeks for evaluation before starting cycle #9. He was advised to call immediately if he has any other concerning symptoms in the interval. The patient voices understanding of current disease status  and treatment options and is in agreement with the current care plan.  All questions were answered. The patient knows to call the clinic with any problems, questions or concerns. We can certainly see the patient much sooner if necessary.  The total time spent in the appointment was 20 minutes.  Disclaimer: This note was dictated with voice recognition software. Similar sounding words can inadvertently be transcribed and may not be corrected upon review.

## 2023-02-15 NOTE — Patient Instructions (Signed)
[facility]  Discharge Instructions: Thank you for choosing [facility] to provide your oncology and hematology care.   If you have a lab appointment with the Cancer Center, please go directly to the Cancer Center and check in at the registration area.   Wear comfortable clothing and clothing appropriate for easy access to any Portacath or PICC line.   We strive to give you quality time with your provider. You may need to reschedule your appointment if you arrive late (15 or more minutes).  Arriving late affects you and other patients whose appointments are after yours.  Also, if you miss three or more appointments without notifying the office, you may be dismissed from the clinic at the provider's discretion.      For prescription refill requests, have your pharmacy contact our office and allow 72 hours for refills to be completed.    Today you received the following chemotherapy and/or immunotherapy agents: Imfinzi      To help prevent nausea and vomiting after your treatment, we encourage you to take your nausea medication as directed.  BELOW ARE SYMPTOMS THAT SHOULD BE REPORTED IMMEDIATELY: *FEVER GREATER THAN 100.4 F (38 C) OR HIGHER *CHILLS OR SWEATING *NAUSEA AND VOMITING THAT IS NOT CONTROLLED WITH YOUR NAUSEA MEDICATION *UNUSUAL SHORTNESS OF BREATH *UNUSUAL BRUISING OR BLEEDING *URINARY PROBLEMS (pain or burning when urinating, or frequent urination) *BOWEL PROBLEMS (unusual diarrhea, constipation, pain near the anus) TENDERNESS IN MOUTH AND THROAT WITH OR WITHOUT PRESENCE OF ULCERS (sore throat, sores in mouth, or a toothache) UNUSUAL RASH, SWELLING OR PAIN  UNUSUAL VAGINAL DISCHARGE OR ITCHING   Items with * indicate a potential emergency and should be followed up as soon as possible or go to the Emergency Department if any problems should occur.  Please show the CHEMOTHERAPY ALERT CARD or IMMUNOTHERAPY ALERT CARD at  check-in to the Emergency Department and triage nurse.  Should you have questions after your visit or need to cancel or reschedule your appointment, please contact [facility]  Dept: [phone]  and follow the prompts.  Office hours are 8:00 a.m. to 4:30 p.m. Monday - Friday. Please note that voicemails left after 4:00 p.m. may not be returned until the following business day.  We are closed weekends and major holidays. You have access to a nurse at all times for urgent questions. Please call the main number to the clinic Dept: [phone] and follow the prompts.   For any non-urgent questions, you may also contact your provider using MyChart. We now offer e-Visits for anyone 18 and older to request care online for non-urgent symptoms. For details visit [email or web address].   Also download the MyChart app! Go to the app store, search "MyChart", open the app, select [facility], and log in with your MyChart username and password.   

## 2023-02-18 ENCOUNTER — Other Ambulatory Visit: Payer: Self-pay

## 2023-03-08 ENCOUNTER — Other Ambulatory Visit: Payer: Self-pay | Admitting: Internal Medicine

## 2023-03-15 ENCOUNTER — Inpatient Hospital Stay: Payer: Medicare HMO

## 2023-03-15 ENCOUNTER — Inpatient Hospital Stay: Payer: Medicare HMO | Attending: Physician Assistant | Admitting: Internal Medicine

## 2023-03-15 ENCOUNTER — Other Ambulatory Visit: Payer: Self-pay

## 2023-03-15 VITALS — BP 113/59 | HR 57

## 2023-03-15 VITALS — BP 122/60 | HR 58 | Temp 97.7°F | Resp 17 | Ht 70.0 in | Wt 159.0 lb

## 2023-03-15 DIAGNOSIS — C3490 Malignant neoplasm of unspecified part of unspecified bronchus or lung: Secondary | ICD-10-CM

## 2023-03-15 DIAGNOSIS — Z5112 Encounter for antineoplastic immunotherapy: Secondary | ICD-10-CM | POA: Diagnosis present

## 2023-03-15 DIAGNOSIS — C3432 Malignant neoplasm of lower lobe, left bronchus or lung: Secondary | ICD-10-CM | POA: Diagnosis not present

## 2023-03-15 DIAGNOSIS — Z95828 Presence of other vascular implants and grafts: Secondary | ICD-10-CM

## 2023-03-15 DIAGNOSIS — C7972 Secondary malignant neoplasm of left adrenal gland: Secondary | ICD-10-CM | POA: Insufficient documentation

## 2023-03-15 DIAGNOSIS — C7971 Secondary malignant neoplasm of right adrenal gland: Secondary | ICD-10-CM | POA: Insufficient documentation

## 2023-03-15 DIAGNOSIS — C7931 Secondary malignant neoplasm of brain: Secondary | ICD-10-CM | POA: Insufficient documentation

## 2023-03-15 LAB — CBC WITH DIFFERENTIAL (CANCER CENTER ONLY)
Abs Immature Granulocytes: 0.05 10*3/uL (ref 0.00–0.07)
Basophils Absolute: 0.1 10*3/uL (ref 0.0–0.1)
Basophils Relative: 1 %
Eosinophils Absolute: 0.5 10*3/uL (ref 0.0–0.5)
Eosinophils Relative: 6 %
HCT: 38.7 % — ABNORMAL LOW (ref 39.0–52.0)
Hemoglobin: 13.6 g/dL (ref 13.0–17.0)
Immature Granulocytes: 1 %
Lymphocytes Relative: 17 %
Lymphs Abs: 1.6 10*3/uL (ref 0.7–4.0)
MCH: 31.6 pg (ref 26.0–34.0)
MCHC: 35.1 g/dL (ref 30.0–36.0)
MCV: 89.8 fL (ref 80.0–100.0)
Monocytes Absolute: 0.8 10*3/uL (ref 0.1–1.0)
Monocytes Relative: 8 %
Neutro Abs: 6.4 10*3/uL (ref 1.7–7.7)
Neutrophils Relative %: 67 %
Platelet Count: 174 10*3/uL (ref 150–400)
RBC: 4.31 MIL/uL (ref 4.22–5.81)
RDW: 12.1 % (ref 11.5–15.5)
WBC Count: 9.4 10*3/uL (ref 4.0–10.5)
nRBC: 0 % (ref 0.0–0.2)

## 2023-03-15 LAB — CMP (CANCER CENTER ONLY)
ALT: 15 U/L (ref 0–44)
AST: 17 U/L (ref 15–41)
Albumin: 4 g/dL (ref 3.5–5.0)
Alkaline Phosphatase: 60 U/L (ref 38–126)
Anion gap: 5 (ref 5–15)
BUN: 16 mg/dL (ref 8–23)
CO2: 30 mmol/L (ref 22–32)
Calcium: 8.1 mg/dL — ABNORMAL LOW (ref 8.9–10.3)
Chloride: 104 mmol/L (ref 98–111)
Creatinine: 1.14 mg/dL (ref 0.61–1.24)
GFR, Estimated: >60 mL/min (ref >60)
Glucose, Bld: 94 mg/dL (ref 70–99)
Potassium: 4 mmol/L (ref 3.5–5.1)
Sodium: 139 mmol/L (ref 135–145)
Total Bilirubin: 0.4 mg/dL (ref 0.3–1.2)
Total Protein: 6.4 g/dL — ABNORMAL LOW (ref 6.5–8.1)

## 2023-03-15 MED ORDER — HEPARIN SOD (PORK) LOCK FLUSH 100 UNIT/ML IV SOLN
500.0000 [IU] | Freq: Once | INTRAVENOUS | Status: AC | PRN
  Administered 2023-03-15: 500 [IU]

## 2023-03-15 MED ORDER — SODIUM CHLORIDE 0.9% FLUSH
10.0000 mL | Freq: Once | INTRAVENOUS | Status: AC
  Administered 2023-03-15: 10 mL

## 2023-03-15 MED ORDER — SODIUM CHLORIDE 0.9 % IV SOLN: Freq: Once | INTRAVENOUS | Status: AC

## 2023-03-15 MED ORDER — SODIUM CHLORIDE 0.9% FLUSH
10.0000 mL | INTRAVENOUS | Status: DC | PRN
  Administered 2023-03-15: 10 mL

## 2023-03-15 MED ORDER — SODIUM CHLORIDE 0.9 % IV SOLN
1500.0000 mg | Freq: Once | INTRAVENOUS | Status: AC
  Administered 2023-03-15: 1500 mg via INTRAVENOUS
  Filled 2023-03-15: qty 30

## 2023-03-15 NOTE — Patient Instructions (Signed)

## 2023-03-15 NOTE — Progress Notes (Signed)
[facility] Telephone:([phone]   Fax:(336) [phone]  OFFICE PROGRESS NOTE  Brett Kitty, MD [address] [place] [address]  DIAGNOSIS: Extensive stage (T4, N3, M1 C) small cell lung cancer presented with obstructing left lower lobe lung mass with left extrapleural lymph node, bilateral adrenal metastasis, pancreatic lesion as well as upper abdominal lymphadenopathy and soft tissue nodules in the abdominal wall as well as the abdomen diagnosed in December 2023.  PRIOR THERAPY: Whole brain irradiation for brain metastasis.  CURRENT THERAPY: Systemic chemotherapy with carboplatin for AUC of 5 on day 1, etoposide 100 Mg/M2 on days 1, 2 and 3 with Cosela 240 Mg/M2 on the days of the chemotherapy and Imfinzi 1500 Mg IV every 3 weeks.  First dose August 10, 2022.  Status post 8 cycles.  Starting from cycle #5 he will be on maintenance treatment with Imfinzi 1500 Mg IV every 4 weeks.  INTERVAL HISTORY: Brett Campbell 62 y.o. male returns to the clinic today for follow-up visit accompanied by his sister and his sign interpreter.  The patient is doing fine today with no concerning complaints.  He enjoyed week of vacation at the [facility] in [place].  He denied having any current chest pain, shortness of breath, cough or hemoptysis.  He has no nausea, vomiting, diarrhea or constipation.  He has no headache or visual changes.  He has no recent weight loss or night sweats.  He is here today for evaluation before starting cycle #9 of his treatment.  MEDICAL HISTORY: Past Medical History:  Diagnosis Date   Calcium deficiency    Cataract    COPD (chronic obstructive pulmonary disease) (HCC)    moderate emphysema   Deaf    Hypertension    Hypothyroidism    Personal history of colonic polyps 11/03/2020   Pneumonia    August 2023   Scarlet fever    Sleep apnea    never used a Cpap, used to use O2   Thyroid disease    Tuberculosis    [place]  department is contact with him due to hx.    ALLERGIES:  is allergic to penicillins.  MEDICATIONS:  Current Outpatient Medications  Medication Sig Dispense Refill   amLODipine (NORVASC) 5 MG tablet Take 1 tablet (5 mg total) by mouth daily. 90 tablet 3   atorvastatin (LIPITOR) 10 MG tablet TAKE 1 TABLET EVERY DAY 90 tablet 3   calcitRIOL (ROCALTROL) 0.25 MCG capsule TAKE 1 CAPSULE EVERY DAY 30 capsule 0   calcium carbonate (TUMS) 500 MG chewable tablet Chew 1 tablet (200 mg of elemental calcium total) by mouth 3 (three) times daily. 90 tablet 3   colestipol (COLESTID) 1 g tablet Take 1 tablet (1 g total) by mouth 2 (two) times daily. (Patient taking differently: Take 1 g by mouth daily.) 60 tablet 5   HYDROcodone bit-homatropine (HYCODAN) 5-1.5 MG/5ML syrup Take 5 mLs by mouth every 6 hours as needed for cough. 240 mL 0   ibuprofen (ADVIL) 200 MG tablet Take 400-800 mg by mouth every 6 (six) hours as needed for moderate pain.     levothyroxine (SYNTHROID) 150 MCG tablet TAKE 1 TABLET EVERY DAY (NEED MD APPOINTMENT) 30 tablet 0   lidocaine-prilocaine (EMLA) cream Apply to affected area once 30 g 3   loperamide (IMODIUM A-D) 2 MG tablet Take 1 tablet (2 mg total) by mouth 4 (four) times daily as needed for diarrhea or loose stools. 30 tablet 0  ondansetron (ZOFRAN) 8 MG tablet Take 1 tablet (8 mg total) by mouth every 8 (eight) hours as needed for nausea, vomiting or refractory nausea / vomiting. Start on the third day after carboplatin. (Patient taking differently: Take 8 mg by mouth every 6 (six) hours as needed for nausea, vomiting or refractory nausea / vomiting. Start on the third day after carboplatin.) 30 tablet 1   potassium chloride SA (KLOR-CON M) 20 MEQ tablet TAKE 1 TABLET EVERY DAY 90 tablet 3   prochlorperazine (COMPAZINE) 10 MG tablet Take 1 tablet (10 mg total) by mouth every 6 (six) hours as needed. (Patient taking differently: Take 10 mg by mouth every 6 (six) hours as needed for  nausea or vomiting.) 30 tablet 2   triamcinolone cream (KENALOG) 0.1 % Apply 1 Application topically 2 (two) times daily as needed. 453.6 g 0   No current facility-administered medications for this visit.    SURGICAL HISTORY:  Past Surgical History:  Procedure Laterality Date   CHOLECYSTECTOMY     COLONOSCOPY     +5years   IR IMAGING GUIDED PORT INSERTION  08/14/2022   LAPAROSCOPIC RIGHT HEMI COLECTOMY N/A 02/26/2021   Procedure: LAPAROSCOPIC RIGHT HEMI COLECTOMY;  Surgeon: Andria Meuse, MD;  Location: [facility];  Service: General;  Laterality: N/A;   THYROIDECTOMY     VIDEO BRONCHOSCOPY WITH ENDOBRONCHIAL ULTRASOUND N/A 07/24/2022   Procedure: VIDEO BRONCHOSCOPY WITH ENDOBRONCHIAL ULTRASOUND  WITH LEFT LOWER LOBE  MASS BIOPSIES;  Surgeon: Martina Sinner, MD;  Location: [facility];  Service: Pulmonary;  Laterality: N/A;    REVIEW OF SYSTEMS:  A comprehensive review of systems was negative.   PHYSICAL EXAMINATION: General appearance: alert, cooperative, and fatigued Head: Normocephalic, without obvious abnormality, atraumatic Neck: no adenopathy, no JVD, supple, symmetrical, trachea midline, and thyroid not enlarged, symmetric, no tenderness/mass/nodules Lymph nodes: Cervical, supraclavicular, and axillary nodes normal. Resp: clear to auscultation bilaterally Back: symmetric, no curvature. ROM normal. No CVA tenderness. Cardio: regular rate and rhythm, S1, S2 normal, no murmur, click, rub or gallop GI: soft, non-tender; bowel sounds normal; no masses,  no organomegaly Extremities: extremities normal, atraumatic, no cyanosis or edema  ECOG PERFORMANCE STATUS: 1 - Symptomatic but completely ambulatory  Blood pressure 122/60, pulse (!) 58, temperature 97.7 F (36.5 C), temperature source Oral, resp. rate 17, height 5\' 10"  (1.778 m), weight 159 lb (72.1 kg), SpO2 99%.  LABORATORY DATA: Lab Results  Component Value Date   WBC 9.4 03/15/2023   HGB 13.6 03/15/2023   HCT 38.7 (L)  03/15/2023   MCV 89.8 03/15/2023   PLT 174 03/15/2023      Chemistry      Component Value Date/Time   NA 139 03/15/2023 1006   NA 139 04/28/2022 1038   K 4.0 03/15/2023 1006   CL 104 03/15/2023 1006   CO2 30 03/15/2023 1006   BUN 16 03/15/2023 1006   BUN 14 04/28/2022 1038   CREATININE 1.14 03/15/2023 1006      Component Value Date/Time   CALCIUM 8.1 (L) 03/15/2023 1006   ALKPHOS 60 03/15/2023 1006   AST 17 03/15/2023 1006   ALT 15 03/15/2023 1006   BILITOT 0.4 03/15/2023 1006       RADIOGRAPHIC STUDIES: No results found.  ASSESSMENT AND PLAN: This is a very pleasant 62 years old white male with extensive stage (T4, N3, M1 C) small cell lung cancer presented with obstructing left lower lobe lung mass with left extrapleural lymph node, bilateral adrenal metastasis, pancreatic lesion  as well as upper abdominal lymphadenopathy and soft tissue nodules in the abdominal wall as well as the abdomen diagnosed in December 2023. The patient started the first cycle of his systemic chemotherapy with carboplatin for AUC of 5 on day 1 and etoposide 100 Mg/M2 on days 1, 2 and 3 with Cosela before the chemotherapy and Imfinzi 1500 Mg IV on day 1 every 3 weeks.  Status post 8 cycle given on August 10, 2022.  Starting from cycle #5 he will be on maintenance treatment with Imfinzi every 4 weeks. The patient has been tolerating this treatment well with no concerning adverse effects. I recommended for him to proceed with cycle #9 today as planned. I will see him back for follow-up visit in 4 weeks for evaluation with repeat CT scan of the chest, abdomen and pelvis for restaging of his disease. The patient was advised to call immediately if he has any concerning symptoms in the interval. The patient voices understanding of current disease status and treatment options and is in agreement with the current care plan.  All questions were answered. The patient knows to call the clinic with any problems,  questions or concerns. We can certainly see the patient much sooner if necessary.  The total time spent in the appointment was 20 minutes.  Disclaimer: This note was dictated with voice recognition software. Similar sounding words can inadvertently be transcribed and may not be corrected upon review.

## 2023-03-21 ENCOUNTER — Other Ambulatory Visit: Payer: Self-pay

## 2023-03-29 ENCOUNTER — Encounter: Payer: Self-pay | Admitting: Internal Medicine

## 2023-03-31 ENCOUNTER — Encounter: Payer: Self-pay | Admitting: Internal Medicine

## 2023-04-02 ENCOUNTER — Other Ambulatory Visit: Payer: Self-pay

## 2023-04-05 ENCOUNTER — Ambulatory Visit (HOSPITAL_COMMUNITY)
Admission: RE | Admit: 2023-04-05 | Discharge: 2023-04-05 | Disposition: A | Discharge status: Home / Self Care | Payer: Medicare HMO | Source: Ambulatory Visit | Attending: Radiation Oncology | Admitting: Radiation Oncology

## 2023-04-05 ENCOUNTER — Ambulatory Visit (HOSPITAL_COMMUNITY): Payer: Medicare HMO

## 2023-04-05 DIAGNOSIS — C7931 Secondary malignant neoplasm of brain: Secondary | ICD-10-CM | POA: Insufficient documentation

## 2023-04-05 DIAGNOSIS — R9082 White matter disease, unspecified: Secondary | ICD-10-CM | POA: Diagnosis not present

## 2023-04-05 DIAGNOSIS — I613 Nontraumatic intracerebral hemorrhage in brain stem: Secondary | ICD-10-CM | POA: Diagnosis not present

## 2023-04-05 MED ORDER — GADOBUTROL 1 MMOL/ML IV SOLN
7.0000 mL | Freq: Once | INTRAVENOUS | Status: AC | PRN
  Administered 2023-04-05: 7 mL via INTRAVENOUS

## 2023-04-09 ENCOUNTER — Encounter (HOSPITAL_COMMUNITY): Payer: Self-pay

## 2023-04-09 ENCOUNTER — Inpatient Hospital Stay: Payer: Medicare HMO | Attending: Physician Assistant

## 2023-04-09 ENCOUNTER — Ambulatory Visit (HOSPITAL_COMMUNITY)
Admission: RE | Admit: 2023-04-09 | Discharge: 2023-04-09 | Disposition: A | Discharge status: Home / Self Care | Payer: Medicare HMO | Source: Ambulatory Visit | Attending: Internal Medicine | Admitting: Internal Medicine

## 2023-04-09 DIAGNOSIS — C7971 Secondary malignant neoplasm of right adrenal gland: Secondary | ICD-10-CM | POA: Insufficient documentation

## 2023-04-09 DIAGNOSIS — C7972 Secondary malignant neoplasm of left adrenal gland: Secondary | ICD-10-CM | POA: Insufficient documentation

## 2023-04-09 DIAGNOSIS — C3490 Malignant neoplasm of unspecified part of unspecified bronchus or lung: Secondary | ICD-10-CM | POA: Diagnosis not present

## 2023-04-09 DIAGNOSIS — C3432 Malignant neoplasm of lower lobe, left bronchus or lung: Secondary | ICD-10-CM | POA: Diagnosis not present

## 2023-04-09 DIAGNOSIS — N281 Cyst of kidney, acquired: Secondary | ICD-10-CM | POA: Diagnosis not present

## 2023-04-09 DIAGNOSIS — C7931 Secondary malignant neoplasm of brain: Secondary | ICD-10-CM | POA: Insufficient documentation

## 2023-04-09 DIAGNOSIS — J9811 Atelectasis: Secondary | ICD-10-CM | POA: Diagnosis not present

## 2023-04-09 DIAGNOSIS — Z5112 Encounter for antineoplastic immunotherapy: Secondary | ICD-10-CM | POA: Insufficient documentation

## 2023-04-09 MED ORDER — IOHEXOL 300 MG/ML  SOLN
100.0000 mL | Freq: Once | INTRAMUSCULAR | Status: AC | PRN
  Administered 2023-04-09: 100 mL via INTRAVENOUS

## 2023-04-09 MED ORDER — SODIUM CHLORIDE (PF) 0.9 % IJ SOLN
INTRAMUSCULAR | Status: AC
  Filled 2023-04-09: qty 50

## 2023-04-09 MED ORDER — HEPARIN SOD (PORK) LOCK FLUSH 100 UNIT/ML IV SOLN
INTRAVENOUS | Status: AC
  Filled 2023-04-09: qty 5

## 2023-04-10 ENCOUNTER — Other Ambulatory Visit: Payer: Self-pay

## 2023-04-12 ENCOUNTER — Inpatient Hospital Stay: Payer: Medicare HMO

## 2023-04-12 ENCOUNTER — Other Ambulatory Visit: Payer: Self-pay

## 2023-04-12 ENCOUNTER — Ambulatory Visit
Admission: RE | Admit: 2023-04-12 | Discharge: 2023-04-12 | Disposition: A | Discharge status: Home / Self Care | Payer: Medicare HMO | Source: Ambulatory Visit | Attending: Radiation Oncology | Admitting: Radiation Oncology

## 2023-04-12 ENCOUNTER — Encounter: Payer: Self-pay | Admitting: Radiation Oncology

## 2023-04-12 ENCOUNTER — Inpatient Hospital Stay: Payer: Medicare HMO | Admitting: Internal Medicine

## 2023-04-12 ENCOUNTER — Other Ambulatory Visit: Payer: Self-pay | Admitting: Radiation Therapy

## 2023-04-12 VITALS — Ht 70.0 in | Wt 157.0 lb

## 2023-04-12 DIAGNOSIS — C3490 Malignant neoplasm of unspecified part of unspecified bronchus or lung: Secondary | ICD-10-CM

## 2023-04-12 DIAGNOSIS — C7931 Secondary malignant neoplasm of brain: Secondary | ICD-10-CM

## 2023-04-12 DIAGNOSIS — C7972 Secondary malignant neoplasm of left adrenal gland: Secondary | ICD-10-CM | POA: Diagnosis not present

## 2023-04-12 DIAGNOSIS — Z95828 Presence of other vascular implants and grafts: Secondary | ICD-10-CM

## 2023-04-12 DIAGNOSIS — C3432 Malignant neoplasm of lower lobe, left bronchus or lung: Secondary | ICD-10-CM | POA: Diagnosis not present

## 2023-04-12 DIAGNOSIS — F17210 Nicotine dependence, cigarettes, uncomplicated: Secondary | ICD-10-CM | POA: Diagnosis not present

## 2023-04-12 DIAGNOSIS — Z5112 Encounter for antineoplastic immunotherapy: Secondary | ICD-10-CM | POA: Diagnosis not present

## 2023-04-12 DIAGNOSIS — C7971 Secondary malignant neoplasm of right adrenal gland: Secondary | ICD-10-CM | POA: Diagnosis not present

## 2023-04-12 LAB — CMP (CANCER CENTER ONLY)
ALT: 16 U/L (ref 0–44)
AST: 19 U/L (ref 15–41)
Albumin: 4.1 g/dL (ref 3.5–5.0)
Alkaline Phosphatase: 59 U/L (ref 38–126)
Anion gap: 7 (ref 5–15)
BUN: 19 mg/dL (ref 8–23)
CO2: 28 mmol/L (ref 22–32)
Calcium: 8 mg/dL — ABNORMAL LOW (ref 8.9–10.3)
Chloride: 106 mmol/L (ref 98–111)
Creatinine: 1.14 mg/dL (ref 0.61–1.24)
GFR, Estimated: >60 mL/min (ref >60)
Glucose, Bld: 122 mg/dL — ABNORMAL HIGH (ref 70–99)
Potassium: 4.1 mmol/L (ref 3.5–5.1)
Sodium: 141 mmol/L (ref 135–145)
Total Bilirubin: 0.4 mg/dL (ref 0.3–1.2)
Total Protein: 6.8 g/dL (ref 6.5–8.1)

## 2023-04-12 LAB — CBC WITH DIFFERENTIAL (CANCER CENTER ONLY)
Abs Immature Granulocytes: 0.02 10*3/uL (ref 0.00–0.07)
Basophils Absolute: 0.1 10*3/uL (ref 0.0–0.1)
Basophils Relative: 1 %
Eosinophils Absolute: 0.4 10*3/uL (ref 0.0–0.5)
Eosinophils Relative: 5 %
HCT: 40.1 % (ref 39.0–52.0)
Hemoglobin: 14.2 g/dL (ref 13.0–17.0)
Immature Granulocytes: 0 %
Lymphocytes Relative: 20 %
Lymphs Abs: 1.6 10*3/uL (ref 0.7–4.0)
MCH: 31.8 pg (ref 26.0–34.0)
MCHC: 35.4 g/dL (ref 30.0–36.0)
MCV: 89.7 fL (ref 80.0–100.0)
Monocytes Absolute: 0.6 10*3/uL (ref 0.1–1.0)
Monocytes Relative: 7 %
Neutro Abs: 5.6 10*3/uL (ref 1.7–7.7)
Neutrophils Relative %: 67 %
Platelet Count: 158 10*3/uL (ref 150–400)
RBC: 4.47 MIL/uL (ref 4.22–5.81)
RDW: 12.2 % (ref 11.5–15.5)
WBC Count: 8.4 10*3/uL (ref 4.0–10.5)
nRBC: 0 % (ref 0.0–0.2)

## 2023-04-12 MED ORDER — SODIUM CHLORIDE 0.9% FLUSH
10.0000 mL | INTRAVENOUS | Status: DC | PRN
  Administered 2023-04-12: 10 mL

## 2023-04-12 MED ORDER — HEPARIN SOD (PORK) LOCK FLUSH 100 UNIT/ML IV SOLN
500.0000 [IU] | Freq: Once | INTRAVENOUS | Status: AC | PRN
  Administered 2023-04-12: 500 [IU]

## 2023-04-12 MED ORDER — SODIUM CHLORIDE 0.9% FLUSH
10.0000 mL | Freq: Once | INTRAVENOUS | Status: AC | PRN
  Administered 2023-04-12: 10 mL

## 2023-04-12 MED ORDER — SODIUM CHLORIDE 0.9 % IV SOLN: Freq: Once | INTRAVENOUS | Status: AC

## 2023-04-12 MED ORDER — SODIUM CHLORIDE 0.9 % IV SOLN
1500.0000 mg | Freq: Once | INTRAVENOUS | Status: AC
  Administered 2023-04-12: 1500 mg via INTRAVENOUS
  Filled 2023-04-12: qty 30

## 2023-04-12 NOTE — Progress Notes (Signed)
[facility] [phone]   [phone]  OFFICE PROGRESS NOTE  Brett Kitty, MD [address] [place] [address]  DIAGNOSIS: Extensive stage (T4, N3, M1 C) small cell lung cancer presented with obstructing left lower lobe lung mass with left extrapleural lymph node, bilateral adrenal metastasis, pancreatic lesion as well as upper abdominal lymphadenopathy and soft tissue nodules in the abdominal wall as well as the abdomen diagnosed in December 2023.  PRIOR THERAPY: Whole brain irradiation for brain metastasis.  CURRENT THERAPY: Systemic chemotherapy with carboplatin for AUC of 5 on day 1, etoposide 100 Mg/M2 on days 1, 2 and 3 with Cosela 240 Mg/M2 on the days of the chemotherapy and Imfinzi 1500 Mg IV every 3 weeks.  First dose August 10, 2022.  Status post 9 cycles.  Starting from cycle #5 he will be on maintenance treatment with Imfinzi 1500 Mg IV every 4 weeks.  INTERVAL HISTORY: Brett Campbell 62 y.o. male returns to the clinic today for follow-up visit accompanied by his sister and his sign interpreter.  The patient is feeling fine today with no concerning complaints.  He denied having any current chest pain, shortness of breath, cough or hemoptysis.  He has no nausea, vomiting, diarrhea or constipation.  He has no headache or visual changes.  He denied having any recent weight loss or night sweats.  He continues to tolerate his treatment with immunotherapy fairly well.  The patient is here today for evaluation with repeat MRI of the brain as well as CT scan of the chest, abdomen and pelvis for restaging of his disease.  MEDICAL HISTORY: Past Medical History:  Diagnosis Date   Calcium deficiency    Cataract    COPD (chronic obstructive pulmonary disease) (HCC)    moderate emphysema   Deaf    Hypertension    Hypothyroidism    Personal history of colonic polyps 11/03/2020   Pneumonia    August 2023   Scarlet fever    Sleep  apnea    never used a Cpap, used to use O2   Thyroid disease    Tuberculosis    [place] Health department is contact with him due to hx.    ALLERGIES:  is allergic to penicillins.  MEDICATIONS:  Current Outpatient Medications  Medication Sig Dispense Refill   amLODipine (NORVASC) 5 MG tablet Take 1 tablet (5 mg total) by mouth daily. 90 tablet 3   atorvastatin (LIPITOR) 10 MG tablet TAKE 1 TABLET EVERY DAY 90 tablet 3   calcitRIOL (ROCALTROL) 0.25 MCG capsule TAKE 1 CAPSULE EVERY DAY 30 capsule 0   calcium carbonate (TUMS) 500 MG chewable tablet Chew 1 tablet (200 mg of elemental calcium total) by mouth 3 (three) times daily. 90 tablet 3   colestipol (COLESTID) 1 g tablet Take 1 tablet (1 g total) by mouth 2 (two) times daily. (Patient taking differently: Take 1 g by mouth daily.) 60 tablet 5   HYDROcodone bit-homatropine (HYCODAN) 5-1.5 MG/5ML syrup Take 5 mLs by mouth every 6 hours as needed for cough. 240 mL 0   ibuprofen (ADVIL) 200 MG tablet Take 400-800 mg by mouth every 6 (six) hours as needed for moderate pain.     levothyroxine (SYNTHROID) 150 MCG tablet TAKE 1 TABLET EVERY DAY (NEED MD APPOINTMENT) 30 tablet 0   lidocaine-prilocaine (EMLA) cream Apply to affected area once 30 g 3   loperamide (IMODIUM A-D) 2 MG tablet Take 1 tablet (2 mg  total) by mouth 4 (four) times daily as needed for diarrhea or loose stools. 30 tablet 0   ondansetron (ZOFRAN) 8 MG tablet Take 1 tablet (8 mg total) by mouth every 8 (eight) hours as needed for nausea, vomiting or refractory nausea / vomiting. Start on the third day after carboplatin. (Patient taking differently: Take 8 mg by mouth every 6 (six) hours as needed for nausea, vomiting or refractory nausea / vomiting. Start on the third day after carboplatin.) 30 tablet 1   potassium chloride SA (KLOR-CON M) 20 MEQ tablet TAKE 1 TABLET EVERY DAY 90 tablet 3   prochlorperazine (COMPAZINE) 10 MG tablet Take 1 tablet (10 mg total) by mouth every 6 (six)  hours as needed. (Patient taking differently: Take 10 mg by mouth every 6 (six) hours as needed for nausea or vomiting.) 30 tablet 2   triamcinolone cream (KENALOG) 0.1 % Apply 1 Application topically 2 (two) times daily as needed. 453.6 g 0   No current facility-administered medications for this visit.    SURGICAL HISTORY:  Past Surgical History:  Procedure Laterality Date   CHOLECYSTECTOMY     COLONOSCOPY     +5years   IR IMAGING GUIDED PORT INSERTION  08/14/2022   LAPAROSCOPIC RIGHT HEMI COLECTOMY N/A 02/26/2021   Procedure: LAPAROSCOPIC RIGHT HEMI COLECTOMY;  Surgeon: Andria Meuse, MD;  Location: [facility];  Service: General;  Laterality: N/A;   THYROIDECTOMY     VIDEO BRONCHOSCOPY WITH ENDOBRONCHIAL ULTRASOUND N/A 07/24/2022   Procedure: VIDEO BRONCHOSCOPY WITH ENDOBRONCHIAL ULTRASOUND  WITH LEFT LOWER LOBE  MASS BIOPSIES;  Surgeon: Martina Sinner, MD;  Location: [facility];  Service: Pulmonary;  Laterality: N/A;    REVIEW OF SYSTEMS:  Constitutional: negative Eyes: negative Ears, nose, mouth, throat, and face: negative Respiratory: negative Cardiovascular: negative Gastrointestinal: negative Genitourinary:negative Integument/breast: negative Hematologic/lymphatic: negative Musculoskeletal:negative Neurological: negative Behavioral/Psych: negative Endocrine: negative Allergic/Immunologic: negative   PHYSICAL EXAMINATION: General appearance: alert, cooperative, and fatigued Head: Normocephalic, without obvious abnormality, atraumatic Neck: no adenopathy, no JVD, supple, symmetrical, trachea midline, and thyroid not enlarged, symmetric, no tenderness/mass/nodules Lymph nodes: Cervical, supraclavicular, and axillary nodes normal. Resp: clear to auscultation bilaterally Back: symmetric, no curvature. ROM normal. No CVA tenderness. Cardio: regular rate and rhythm, S1, S2 normal, no murmur, click, rub or gallop GI: soft, non-tender; bowel sounds normal; no masses,  no  organomegaly Extremities: extremities normal, atraumatic, no cyanosis or edema Neurologic: Alert and oriented X 3, normal strength and tone. Normal symmetric reflexes. Normal coordination and gait  ECOG PERFORMANCE STATUS: 1 - Symptomatic but completely ambulatory  Blood pressure 127/71, pulse (!) 55, temperature 97.6 F (36.4 C), temperature source Oral, resp. rate 18, height 5\' 10"  (1.778 m), weight 157 lb (71.2 kg), SpO2 100%.  LABORATORY DATA: Lab Results  Component Value Date   WBC 8.4 04/12/2023   HGB 14.2 04/12/2023   HCT 40.1 04/12/2023   MCV 89.7 04/12/2023   PLT 158 04/12/2023      Chemistry      Component Value Date/Time   NA 141 04/12/2023 0826   NA 139 04/28/2022 1038   K 4.1 04/12/2023 0826   CL 106 04/12/2023 0826   CO2 28 04/12/2023 0826   BUN 19 04/12/2023 0826   BUN 14 04/28/2022 1038   CREATININE 1.14 04/12/2023 0826      Component Value Date/Time   CALCIUM 8.0 (L) 04/12/2023 0826   ALKPHOS 59 04/12/2023 0826   AST 19 04/12/2023 0826   ALT 16 04/12/2023 0826  BILITOT 0.4 04/12/2023 0826       RADIOGRAPHIC STUDIES: MR Brain W Wo Contrast  Result Date: 04/12/2023 CLINICAL DATA:  Brain metastases, assess treatment response. History of whole-brain radiotherapy. EXAM: MRI HEAD WITHOUT AND WITH CONTRAST TECHNIQUE: Multiplanar, multiecho pulse sequences of the brain and surrounding structures were obtained without and with intravenous contrast. CONTRAST:  7mL GADAVIST GADOBUTROL 1 MMOL/ML IV SOLN COMPARISON:  12/29/2022 FINDINGS: Brain: 2 subcentimeter areas of enhancement the posterior pons are unchanged. Decreased size of a left posterior frontal cortex lesion on 1100:240, now 5 mm. A right frontal white matter lesion is no longer seen. No new lesion. Progressive FLAIR hyperintensity in the cerebral white matter and bilateral middle cerebellar peduncles attributed to whole-brain radiotherapy. No acute infarct, hemorrhage, hydrocephalus, or mass.  Mineralization in the deep cerebellum and globus pallidus considered metabolic. Stable micro hemorrhages in the posterior pons. Enlarged endo lymphatic sacs bilaterally.  History of deafness. Vascular: Normal flow voids. Skull and upper cervical spine: No focal marrow lesion. Sinuses/Orbits: Chronic right mastoid and middle ear opacification with negative nasopharynx. IMPRESSION: 1. Stable and regressed areas of metastatic enhancement. No new or progressive lesion. 2. Progressed white matter disease in keeping with history of whole-brain radiotherapy. Electronically Signed   By: Tiburcio Pea M.D.   On: 04/12/2023 08:36    ASSESSMENT AND PLAN: This is a very pleasant 62 years old white male with extensive stage (T4, N3, M1 C) small cell lung cancer presented with obstructing left lower lobe lung mass with left extrapleural lymph node, bilateral adrenal metastasis, pancreatic lesion as well as upper abdominal lymphadenopathy and soft tissue nodules in the abdominal wall as well as the abdomen diagnosed in December 2023. The patient started the first cycle of his systemic chemotherapy with carboplatin for AUC of 5 on day 1 and etoposide 100 Mg/M2 on days 1, 2 and 3 with Cosela before the chemotherapy and Imfinzi 1500 Mg IV on day 1 every 3 weeks.  Status post 9 cycle given on August 10, 2022.  Starting from cycle #5 he will be on maintenance treatment with Imfinzi every 4 weeks. The patient has been tolerating this treatment well with no concerning adverse effects. He had repeat CT scan of the chest, abdomen pelvis as well as MRI of the brain performed recently.  His MRI showed no concerning findings for disease progression and the report for the CT scan of the chest, abdomen and pelvis is still pending.  I personally and independently reviewed the scan images and I did not see any concerning findings for disease progression but I will wait for the final report for confirmation. I recommended for the patient  to proceed with cycle #10 today as planned. I will see him back for follow-up visit in 4 weeks for evaluation before the next cycle of his treatment. The patient was advised to call immediately if he has any other concerning symptoms in the interval. The patient voices understanding of current disease status and treatment options and is in agreement with the current care plan.  All questions were answered. The patient knows to call the clinic with any problems, questions or concerns. We can certainly see the patient much sooner if necessary.  The total time spent in the appointment was 30 minutes.  Disclaimer: This note was dictated with voice recognition software. Similar sounding words can inadvertently be transcribed and may not be corrected upon review.

## 2023-04-12 NOTE — Progress Notes (Signed)
Telephone nursing appointment for patient to review most recent scan results. I verified patient's identity x2 and began nursing interview.   Patient reports doing well. No issues conveyed at this time.   Meaningful use complete.   Patient aware of their 1:30pm-04/12/2023 telephone appointment w/ Laurence Aly PA-C. I left my extension [phone] in case patient needs anything. Patient verbalized understanding. This concludes the nursing interview.   Patient contact [phone]     Ruel Favors, LPN

## 2023-04-12 NOTE — Patient Instructions (Signed)

## 2023-04-12 NOTE — Progress Notes (Signed)
Radiation Oncology         [phone] ________________________________  Initial Outpatient Consultation - Conducted via telephone at patient request.  I spoke with the patient to conduct this visit via telephone. The patient was notified in advance and was offered an in person or telemedicine meeting to allow for face to face communication but instead preferred to proceed with a telephone visit.   Name: Brett Campbell        MRN: [id number]  Date of Service: 04/12/2023 DOB: 1961/02/27  WU:[id number], Brett Caras, MD  [name], NP     REFERRING PHYSICIAN: [name], NP   DIAGNOSIS:  Extensive Stage Small Cell Lung Cancer.    HISTORY OF PRESENT ILLNESS: Brett Campbell is a 62 y.o. 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 He was diagnosed on 07/24/22 when a bronchoscopy was performed and cytology of the LLL confirmed small cell carcinoma. He began systemic chemotherapy and immunotherapy on 08/10/22. He was found to have brain disease and went on to receive whole brain radiation which he completed on 10/02/22.He has continued with chemo/immunotherapy with Dr. Arbutus Ped.   He continues to be followed with our department with surveillance MRI scans of the brain. His most recent on 04/05/23 showed no new or progressive disease. He has two stable subcentimeter areas of enhancement in the posterior pons, a decrease in the left posterior frontal cortex lesion, and a right frontal lesion is no longer seen. He continues to have stable endolymphatic sacs were noted and chronic right  mastoid opacification are noted. His sister Brett Campbell is often part of his visits to help communicate clinical information.   PREVIOUS RADIATION THERAPY:    09/21/2022 through 10/02/2022 Site Technique Total Dose (Gy) Dose per Fx (Gy) Completed Fx Beam Energies  Brain:  Whole Brain Complex 30/30 3 10/10 6X     PAST MEDICAL HISTORY:  Past Medical History:  Diagnosis  Date   Calcium deficiency    Cataract    COPD (chronic obstructive pulmonary disease) (HCC)    moderate emphysema   Deaf    Hypertension    Hypothyroidism    Personal history of colonic polyps 11/03/2020   Pneumonia    August 2023   Scarlet fever    Sleep apnea    never used a Cpap, used to use O2   Thyroid disease    Tuberculosis    [place]. Health department is contact with him due to hx.       PAST SURGICAL HISTORY: Past Surgical History:  Procedure Laterality Date   CHOLECYSTECTOMY     COLONOSCOPY     +5years   IR IMAGING GUIDED PORT INSERTION  08/14/2022   LAPAROSCOPIC RIGHT HEMI COLECTOMY N/A 02/26/2021   Procedure: LAPAROSCOPIC RIGHT HEMI COLECTOMY;  Surgeon: Andria Meuse, MD;  Location: [facility];  Service: General;  Laterality: N/A;   THYROIDECTOMY     VIDEO BRONCHOSCOPY WITH ENDOBRONCHIAL ULTRASOUND N/A 07/24/2022   Procedure: VIDEO BRONCHOSCOPY WITH ENDOBRONCHIAL ULTRASOUND  WITH LEFT LOWER LOBE  MASS BIOPSIES;  Surgeon: Martina Sinner, MD;  Location: [facility];  Service: Pulmonary;  Laterality: N/A;     FAMILY HISTORY:  Family History  Problem Relation Age of Onset   CAD Mother    Diabetes Mellitus II Mother    Diabetes Mother    CAD Father    Heart attack Father    Heart disease Father    Diabetes Sister  Colon cancer Neg Hx    Esophageal cancer Neg Hx    Stomach cancer Neg Hx    Colon polyps Neg Hx      SOCIAL HISTORY:  reports that he has been smoking cigarettes. He has a 11.3 pack-year smoking history. He has never used smokeless tobacco. He reports current alcohol use of about 1.0 - 2.0 standard drink of alcohol per week. He reports current drug use. Drug: Marijuana. The patient is single and lives with his sister Brett Campbell. His sister Brett Campbell lives out of state and joins [place] during the visit by phone. He is deaf and communicates with ASL, and also reads lips.     ALLERGIES: Penicillins   MEDICATIONS:  Current Outpatient Medications  Medication  Sig Dispense Refill   amLODipine (NORVASC) 5 MG tablet Take 1 tablet (5 mg total) by mouth daily. 90 tablet 3   atorvastatin (LIPITOR) 10 MG tablet TAKE 1 TABLET EVERY DAY 90 tablet 3   calcitRIOL (ROCALTROL) 0.25 MCG capsule TAKE 1 CAPSULE EVERY DAY 30 capsule 0   calcium carbonate (TUMS) 500 MG chewable tablet Chew 1 tablet (200 mg of elemental calcium total) by mouth 3 (three) times daily. 90 tablet 3   colestipol (COLESTID) 1 g tablet Take 1 tablet (1 g total) by mouth 2 (two) times daily. (Patient taking differently: Take 1 g by mouth daily.) 60 tablet 5   HYDROcodone bit-homatropine (HYCODAN) 5-1.5 MG/5ML syrup Take 5 mLs by mouth every 6 hours as needed for cough. 240 mL 0   ibuprofen (ADVIL) 200 MG tablet Take 400-800 mg by mouth every 6 (six) hours as needed for moderate pain.     levothyroxine (SYNTHROID) 150 MCG tablet TAKE 1 TABLET EVERY DAY (NEED MD APPOINTMENT) 30 tablet 0   lidocaine-prilocaine (EMLA) cream Apply to affected area once 30 g 3   loperamide (IMODIUM A-D) 2 MG tablet Take 1 tablet (2 mg total) by mouth 4 (four) times daily as needed for diarrhea or loose stools. 30 tablet 0   ondansetron (ZOFRAN) 8 MG tablet Take 1 tablet (8 mg total) by mouth every 8 (eight) hours as needed for nausea, vomiting or refractory nausea / vomiting. Start on the third day after carboplatin. (Patient taking differently: Take 8 mg by mouth every 6 (six) hours as needed for nausea, vomiting or refractory nausea / vomiting. Start on the third day after carboplatin.) 30 tablet 1   potassium chloride SA (KLOR-CON M) 20 MEQ tablet TAKE 1 TABLET EVERY DAY 90 tablet 3   prochlorperazine (COMPAZINE) 10 MG tablet Take 1 tablet (10 mg total) by mouth every 6 (six) hours as needed. (Patient taking differently: Take 10 mg by mouth every 6 (six) hours as needed for nausea or vomiting.) 30 tablet 2   triamcinolone cream (KENALOG) 0.1 % Apply 1 Application topically 2 (two) times daily as needed. 453.6 g 0   No  current facility-administered medications for this encounter.   Facility-Administered Medications Ordered in Other Encounters  Medication Dose Route Frequency Provider Last Rate Last Admin   sodium chloride flush (NS) 0.9 % injection 10 mL  10 mL Intracatheter PRN Si Gaul, MD   10 mL at 04/12/23 1121     REVIEW OF SYSTEMS: On review of systems, the patient's sister states her brother is feeling pretty well. He does have occasional nausea related to his chemotherapy regimen as well as fatigue from this therapy. He denies any headaches, visual or movement changes. He is not having any dizziness. No  other complaints are verbalized by his sister on his behalf.      PHYSICAL EXAM:  Unable to assess due to encounter type     ECOG = 1  0 - Asymptomatic (Fully active, able to carry on all predisease activities without restriction)  1 - Symptomatic but completely ambulatory (Restricted in physically strenuous activity but ambulatory and able to carry out work of a light or sedentary nature. For example, light housework, office work)  2 - Symptomatic, <50% in bed during the day (Ambulatory and capable of all self care but unable to carry out any work activities. Up and about more than 50% of waking hours)  3 - Symptomatic, >50% in bed, but not bedbound (Capable of only limited self-care, confined to bed or chair 50% or more of waking hours)  4 - Bedbound (Completely disabled. Cannot carry on any self-care. Totally confined to bed or chair)  5 - Death   Santiago Glad MM, Creech RH, Tormey DC, et al. [phone]). "Toxicity and response criteria of the [facility]". Am. Evlyn Clines. Oncol. 5 (6): 649-55    LABORATORY DATA:  Lab Results  Component Value Date   WBC 8.4 04/12/2023   HGB 14.2 04/12/2023   HCT 40.1 04/12/2023   MCV 89.7 04/12/2023   PLT 158 04/12/2023   Lab Results  Component Value Date   NA 141 04/12/2023   K 4.1 04/12/2023   CL 106 04/12/2023   CO2 28  04/12/2023   Lab Results  Component Value Date   ALT 16 04/12/2023   AST 19 04/12/2023   ALKPHOS 59 04/12/2023   BILITOT 0.4 04/12/2023      RADIOGRAPHY: MR Brain W Wo Contrast  Result Date: 04/12/2023 CLINICAL DATA:  Brain metastases, assess treatment response. History of whole-brain radiotherapy. EXAM: MRI HEAD WITHOUT AND WITH CONTRAST TECHNIQUE: Multiplanar, multiecho pulse sequences of the brain and surrounding structures were obtained without and with intravenous contrast. CONTRAST:  7mL GADAVIST GADOBUTROL 1 MMOL/ML IV SOLN COMPARISON:  12/29/2022 FINDINGS: Brain: 2 subcentimeter areas of enhancement the posterior pons are unchanged. Decreased size of a left posterior frontal cortex lesion on 1100:240, now 5 mm. A right frontal white matter lesion is no longer seen. No new lesion. Progressive FLAIR hyperintensity in the cerebral white matter and bilateral middle cerebellar peduncles attributed to whole-brain radiotherapy. No acute infarct, hemorrhage, hydrocephalus, or mass. Mineralization in the deep cerebellum and globus pallidus considered metabolic. Stable micro hemorrhages in the posterior pons. Enlarged endo lymphatic sacs bilaterally.  History of deafness. Vascular: Normal flow voids. Skull and upper cervical spine: No focal marrow lesion. Sinuses/Orbits: Chronic right mastoid and middle ear opacification with negative nasopharynx. IMPRESSION: 1. Stable and regressed areas of metastatic enhancement. No new or progressive lesion. 2. Progressed white matter disease in keeping with history of whole-brain radiotherapy. Electronically Signed   By: Tiburcio Pea M.D.   On: 04/12/2023 08:36       IMPRESSION/PLAN: 1. 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 The patient remains without new disease and we discussed that his MRI shows continued improvement and stability in some of the areas of previously seen disease. He will continue systemic  therapy with Dr. Arbutus Ped and is awaiting the read on his most recent CT scans to determine how his treatment is working. I recommended that he have a repeat MRI in 3 months. She is in agreement and interprets this information with Mr. Eyer.  2. Mastoid opacification.  The patient's hearing is not of concern at this point given his known history of deafness. He does not describe any symptoms today and this will be followed expectantly as is the endolymphatic sac enlargement that has also been stable.     The patient has provided two factor identification and has given verbal consent for this type of encounter and has been advised to only accept a meeting of this type in a secure network environment. The time spent during this encounter was 35 minutes including preparation, discussion, and coordination of the patient's care. The attendants for this meeting include  Ronny Bacon  and Nat Christen Sylvester and his sister Jerelene Redden. During the encounter,  Rober Minion, RN, Dr. Mitzi Hansen, and Ronny Bacon were located at [facility] Radiation Oncology Department.  Castiel Delose Ovens was located in the care with his sister Jerelene Redden leaving the cancer center though I had tried to go up to the infusion suite to visit they had finished before I was able to get upstairs.       Osker Mason, [facility]   **Disclaimer: This note was dictated with voice recognition software. Similar sounding words can inadvertently be transcribed and this note may contain transcription errors which may not have been corrected upon publication of note.**

## 2023-04-14 ENCOUNTER — Other Ambulatory Visit: Payer: Self-pay

## 2023-04-16 ENCOUNTER — Telehealth: Payer: Self-pay | Admitting: Gastroenterology

## 2023-04-16 MED ORDER — COLESTIPOL HCL 1 G PO TABS: 1.0000 g | ORAL_TABLET | Freq: Two times a day (BID) | ORAL | 3 refills | Status: DC

## 2023-04-16 NOTE — Telephone Encounter (Signed)
Script sent to pharmacy. Patient due for yearly follow up in 06/2023

## 2023-04-16 NOTE — Telephone Encounter (Signed)
Inbound call from [facility] requesting refill for Colestipol. Please advise.

## 2023-04-29 ENCOUNTER — Other Ambulatory Visit: Payer: Self-pay | Admitting: Nurse Practitioner

## 2023-04-29 ENCOUNTER — Other Ambulatory Visit: Payer: Self-pay | Admitting: Family Medicine

## 2023-04-29 DIAGNOSIS — E876 Hypokalemia: Secondary | ICD-10-CM

## 2023-04-29 DIAGNOSIS — E782 Mixed hyperlipidemia: Secondary | ICD-10-CM

## 2023-04-29 NOTE — Telephone Encounter (Signed)
Needs appointment for any refills.

## 2023-05-05 NOTE — Progress Notes (Signed)
[facility] OFFICE PROGRESS NOTE  Brett Kitty, MD [address] [place] [address]  DIAGNOSIS: Extensive stage (T4, N3, M1 C) small cell lung cancer presented with obstructing left lower lobe lung mass with left extrapleural lymph node, bilateral adrenal metastasis, pancreatic lesion as well as upper abdominal lymphadenopathy and soft tissue nodules in the abdominal wall as well as the abdomen diagnosed in December 2023.   PRIOR THERAPY: Whole brain irradiation for brain metastasis. Completed on 10/02/22  CURRENT THERAPY: Systemic chemotherapy with carboplatin for AUC of 5 on day 1, etoposide 100 Mg/M2 on days 1, 2 and 3 with Cosela 240 Mg/M2 on the days of the chemotherapy and Imfinzi 1500 Mg IV every 3 weeks.  First dose August 10, 2022.  Status post 10 cycles.  Starting from cycle #5 he will be on maintenance treatment with Imfinzi 1500 Mg IV every 4 weeks.   INTERVAL HISTORY: Brett Campbell 62 y.o. male returns to the clinic today for a follow-up visit accompanied by his sister and sign interpreter. The patient completed 4 cycles of chemotherapy and immunotherapy. He is currently on maintenance immunotherapy and is tolerating it well overall without any concerning complaints.  He was seen in the clinic last month for follow-up visit, he had a restaging CT scan performed a few days prior.  However the final radiology report was not available at that time.  The radiology report is currently available and shows enlarging adrenal lesions.    Today he denies any fever, chills, night sweats, or unexplained weight loss.  His sister states that he is eating well.  He is fatigued but moves around the house.  Denies any pain.  Denies any chest pain or hemoptysis. She mentions she feels like his cough has changed.  He may cough occasionally but denies any consistent cough.  He relayed that his breathing is stable. He has stable dyspnea on exertion. His diarrhea has  improved/controlled as long as he takes his cholestyramine BID. He denies nausea, vomiting, or diarrhea. Denies any headache or visual changes.  He previously had a rash on his chest which has improved with the Kenalog cream.  His next surveillance brain MRI is due in November 2024.  He is here today for evaluation before undergoing cycle #11.    MEDICAL HISTORY: Past Medical History:  Diagnosis Date   Calcium deficiency    Cataract    COPD (chronic obstructive pulmonary disease) (HCC)    moderate emphysema   Deaf    Hypertension    Hypothyroidism    Personal history of colonic polyps 11/03/2020   Pneumonia    August 2023   Scarlet fever    Sleep apnea    never used a Cpap, used to use O2   Thyroid disease    Tuberculosis    [place] department is contact with him due to hx.    ALLERGIES:  is allergic to penicillins.  MEDICATIONS:  Current Outpatient Medications  Medication Sig Dispense Refill   amLODipine (NORVASC) 5 MG tablet Take 1 tablet (5 mg total) by mouth daily. 90 tablet 3   atorvastatin (LIPITOR) 10 MG tablet TAKE 1 TABLET EVERY DAY 90 tablet 3   calcitRIOL (ROCALTROL) 0.25 MCG capsule TAKE 1 CAPSULE EVERY DAY 30 capsule 0   calcium carbonate (TUMS) 500 MG chewable tablet Chew 1 tablet (200 mg of elemental calcium total) by mouth 3 (three) times daily. 90 tablet 3   colestipol (COLESTID) 1 g tablet Take  1 tablet (1 g total) by mouth 2 (two) times daily. 60 tablet 3   HYDROcodone bit-homatropine (HYCODAN) 5-1.5 MG/5ML syrup Take 5 mLs by mouth every 6 hours as needed for cough. 240 mL 0   ibuprofen (ADVIL) 200 MG tablet Take 400-800 mg by mouth every 6 (six) hours as needed for moderate pain.     levothyroxine (SYNTHROID) 150 MCG tablet TAKE 1 TABLET EVERY DAY (NEED MD APPOINTMENT) 30 tablet 0   lidocaine-prilocaine (EMLA) cream Apply to affected area once 30 g 3   loperamide (IMODIUM A-D) 2 MG tablet Take 1 tablet (2 mg total) by mouth 4 (four) times daily as  needed for diarrhea or loose stools. 30 tablet 0   ondansetron (ZOFRAN) 8 MG tablet Take 1 tablet (8 mg total) by mouth every 8 (eight) hours as needed for nausea, vomiting or refractory nausea / vomiting. Start on the third day after carboplatin. (Patient taking differently: Take 8 mg by mouth every 6 (six) hours as needed for nausea, vomiting or refractory nausea / vomiting. Start on the third day after carboplatin.) 30 tablet 1   potassium chloride SA (KLOR-CON M) 20 MEQ tablet TAKE 1 TABLET EVERY DAY 90 tablet 3   prochlorperazine (COMPAZINE) 10 MG tablet Take 1 tablet (10 mg total) by mouth every 6 (six) hours as needed. (Patient taking differently: Take 10 mg by mouth every 6 (six) hours as needed for nausea or vomiting.) 30 tablet 2   triamcinolone cream (KENALOG) 0.1 % Apply 1 Application topically 2 (two) times daily as needed. 453.6 g 0   No current facility-administered medications for this visit.    SURGICAL HISTORY:  Past Surgical History:  Procedure Laterality Date   CHOLECYSTECTOMY     COLONOSCOPY     +5years   IR IMAGING GUIDED PORT INSERTION  08/14/2022   LAPAROSCOPIC RIGHT HEMI COLECTOMY N/A 02/26/2021   Procedure: LAPAROSCOPIC RIGHT HEMI COLECTOMY;  Surgeon: Andria Meuse, MD;  Location: [facility];  Service: General;  Laterality: N/A;   THYROIDECTOMY     VIDEO BRONCHOSCOPY WITH ENDOBRONCHIAL ULTRASOUND N/A 07/24/2022   Procedure: VIDEO BRONCHOSCOPY WITH ENDOBRONCHIAL ULTRASOUND  WITH LEFT LOWER LOBE  MASS BIOPSIES;  Surgeon: Martina Sinner, MD;  Location: [facility];  Service: Pulmonary;  Laterality: N/A;    REVIEW OF SYSTEMS:   Review of Systems  Constitutional: Positive for stable fatigue. Negative for appetite change, chills,  fever and unexpected weight change.  HENT: Negative for mouth sores, nosebleeds, sore throat and trouble swallowing.   Eyes: Negative for eye problems and icterus.  Respiratory: Positive for stable dyspnea on exertion. Positive for intermittent  mild cough. Negative for hemoptysis and wheezing.   Cardiovascular: Negative for chest pain and leg swelling.  Gastrointestinal: Negative for abdominal pain, constipation, diarrhea (improved), nausea and vomiting.  Genitourinary: Negative for bladder incontinence, difficulty urinating, dysuria, frequency and hematuria.   Musculoskeletal: Negative for back pain, gait problem, neck pain and neck stiffness.  Skin:Positive for rash/itching on chest.  Neurological: Negative for dizziness, extremity weakness, gait problem, headaches, light-headedness and seizures.  Hematological: Negative for adenopathy. Does not bruise/bleed easily.  Psychiatric/Behavioral: Negative for confusion, depression and sleep disturbance. The patient is not nervous/anxious   PHYSICAL EXAMINATION:  Blood pressure 106/68, pulse (!) 53, temperature 97.6 F (36.4 C), temperature source Oral, resp. rate 17, height 5\' 10"  (1.778 m), weight 157 lb 2 oz (71.3 kg), SpO2 97%.  ECOG PERFORMANCE STATUS: 1  Physical Exam  Constitutional: Oriented to person, place, and  time and well-developed, well-nourished, and in no distress.  HENT:  Head: Normocephalic and atraumatic. Positive for deafness.  Mouth/Throat: Oropharynx is clear and moist. No oropharyngeal exudate.  Eyes: Conjunctivae are normal. Right eye exhibits no discharge. Left eye exhibits no discharge. No scleral icterus.  Neck: Normal range of motion. Neck supple.  Cardiovascular: Normal rate, regular rhythm, normal heart sounds and intact distal pulses.   Pulmonary/Chest: Effort normal and breath sounds normal. No respiratory distress. No wheezes. No rales.  Abdominal: Soft. Bowel sounds are normal. Exhibits no distension and no mass. There is no tenderness.  Musculoskeletal: Normal range of motion. Exhibits no edema.  Lymphadenopathy:    No cervical adenopathy.  Neurological: Alert and oriented to person, place, and time. Exhibits normal muscle tone. Gait normal.  Coordination normal.  Skin: Skin is warm and dry. Positive for rash on chest. Not diaphoretic. No erythema. No pallor.  Psychiatric: Mood, memory and judgment normal.  Vitals reviewed.  LABORATORY DATA: Lab Results  Component Value Date   WBC 8.5 05/10/2023   HGB 14.2 05/10/2023   HCT 39.8 05/10/2023   MCV 88.8 05/10/2023   PLT 181 05/10/2023      Chemistry      Component Value Date/Time   NA 138 05/10/2023 0738   NA 139 04/28/2022 1038   K 3.8 05/10/2023 0738   CL 103 05/10/2023 0738   CO2 29 05/10/2023 0738   BUN 21 05/10/2023 0738   BUN 14 04/28/2022 1038   CREATININE 1.25 (H) 05/10/2023 0738      Component Value Date/Time   CALCIUM 8.1 (L) 05/10/2023 0738   ALKPHOS 63 05/10/2023 0738   AST 18 05/10/2023 0738   ALT 18 05/10/2023 0738   BILITOT 0.5 05/10/2023 0738       RADIOGRAPHIC STUDIES:  No results found.   ASSESSMENT/PLAN:  This is a very pleasant 62 year old Caucasian male with Extensive stage (T4, N3, M1 C) small cell lung cancer presented with obstructing left lower lobe lung mass with left extrapleural lymph node, bilateral adrenal metastasis, pancreatic lesion as well as upper abdominal lymphadenopathy and soft tissue nodules in the abdominal wall as well as the abdomen diagnosed in December 2023.   He is undergoing treatment palliative systemic chemotherapy with carboplatin for an AUC of 5 on day 1, etoposide 100 mg/m on days 1, 2, and 3 and Imfinzi 5000 mg IV every 3 weeks.  Starting from cycle #5, he started maintenance immunotherapy with Imfinzi 1500 mg single agent every 4 weeks.  He is status post 10 cycles total.   The patient was seen with Dr. Arbutus Ped.  The patient scans show progressive bilateral adrenal gland metastasis with recurrent right adrenal nodule progressive enlargement of the left adrenal nodules.    Dr. Arbutus Ped recommends referral to radiation oncology for consideration of radiation to these areas and to continue on his  immunotherapy.  Dr. Arbutus Ped explained that in some point in the future with his small cell lung cancer, he expects further disease progression, and at that point in time we may need to revisit chemotherapy.  For now we will try and keep him on immunotherapy as long as we can as the patient tolerates this well with good quality of life.  Labs were reviewed. Recommend he proceed with cycle #11 today as scheduled.   I placed a referral back to Dr. Mitzi Hansen and [facility] and radiation oncology.  We will see him back for follow-up visit in 4 weeks for evaluation repeat blood  work before undergoing cycle #12   He will continue cholestyramine for diarrhea, which is better at this time   He will continue using his steroid cream if needed for itching.  The patient was advised to call immediately if he has any concerning symptoms in the interval. The patient voices understanding of current disease status and treatment options and is in agreement with the current care plan. All questions were answered. The patient knows to call the clinic with any problems, questions or concerns. We can certainly see the patient much sooner if necessary   Orders Placed This Encounter  Procedures   Ambulatory referral to Radiation Oncology    Referral Priority:   Routine    Referral Type:   Consultation    Referral Reason:   Specialty Services Required    Requested Specialty:   Radiation Oncology    Number of Visits Requested:   1      Ariell Gunnels L Willliam Pettet, PA-C 05/10/23  ADDENDUM: Hematology/Oncology Attending: I had a face-to-face encounter with the patient today.  I reviewed his record, lab, scan and recommended his care plan.  This is a very pleasant 62 years old white male with extensive stage small cell lung cancer diagnosed in December 2023 status post whole brain irradiation followed by induction systemic chemotherapy with carboplatin, etoposide and Imfinzi for 4 cycles and currently on maintenance  treatment with immunotherapy with Imfinzi 1500 Mg IV every 4 weeks status post 6 more cycles. He has been tolerating his treatment fairly well with no concerning adverse effects. He had repeat CT scan of the chest, abdomen and pelvis performed recently.  The report was not available at the time of his last visit but the final report showed evidence for increase in bilateral adrenal metastasis. There is no other evidence of metastatic disease progression in any other areas. I recommended for the patient to continue his current treatment with Imfinzi and he will proceed with cycle #11 today. For the suspicious disease progression in the bilateral adrenal glands, I will refer the patient back to Dr. Mitzi Hansen for consideration of palliative radiotherapy to these areas. If the patient develop any additional disease progression in the future, I will consider discontinuing the maintenance Imfinzi and starting the patient on the second line therapy likely with chemotherapy. The patient and his sister were in agreement with the current plan. The patient was advised to call immediately if he has any other concerning symptoms in the interval. The total time spent in the appointment was 30 minutes. Disclaimer: This note was dictated with voice recognition software. Similar sounding words can inadvertently be transcribed and may be missed upon review. Lajuana Matte, MD

## 2023-05-06 ENCOUNTER — Other Ambulatory Visit: Payer: Self-pay | Admitting: Radiation Therapy

## 2023-05-07 ENCOUNTER — Other Ambulatory Visit: Payer: Self-pay

## 2023-05-10 ENCOUNTER — Inpatient Hospital Stay: Payer: Medicare HMO | Admitting: Physician Assistant

## 2023-05-10 ENCOUNTER — Inpatient Hospital Stay: Payer: Medicare HMO | Attending: Physician Assistant

## 2023-05-10 ENCOUNTER — Inpatient Hospital Stay: Payer: Medicare HMO

## 2023-05-10 ENCOUNTER — Encounter: Payer: Self-pay | Admitting: Internal Medicine

## 2023-05-10 VITALS — BP 106/68 | HR 53 | Temp 97.6°F | Resp 17 | Ht 70.0 in | Wt 157.1 lb

## 2023-05-10 VITALS — BP 127/76 | HR 57 | Temp 97.7°F | Resp 16

## 2023-05-10 DIAGNOSIS — C3490 Malignant neoplasm of unspecified part of unspecified bronchus or lung: Secondary | ICD-10-CM

## 2023-05-10 DIAGNOSIS — Z95828 Presence of other vascular implants and grafts: Secondary | ICD-10-CM

## 2023-05-10 DIAGNOSIS — C7972 Secondary malignant neoplasm of left adrenal gland: Secondary | ICD-10-CM | POA: Diagnosis not present

## 2023-05-10 DIAGNOSIS — Z923 Personal history of irradiation: Secondary | ICD-10-CM | POA: Diagnosis not present

## 2023-05-10 DIAGNOSIS — Z5112 Encounter for antineoplastic immunotherapy: Secondary | ICD-10-CM

## 2023-05-10 DIAGNOSIS — C7931 Secondary malignant neoplasm of brain: Secondary | ICD-10-CM | POA: Insufficient documentation

## 2023-05-10 DIAGNOSIS — C7971 Secondary malignant neoplasm of right adrenal gland: Secondary | ICD-10-CM | POA: Insufficient documentation

## 2023-05-10 DIAGNOSIS — C3432 Malignant neoplasm of lower lobe, left bronchus or lung: Secondary | ICD-10-CM | POA: Diagnosis not present

## 2023-05-10 DIAGNOSIS — Z9221 Personal history of antineoplastic chemotherapy: Secondary | ICD-10-CM | POA: Diagnosis not present

## 2023-05-10 LAB — CBC WITH DIFFERENTIAL (CANCER CENTER ONLY)
Abs Immature Granulocytes: 0.03 10*3/uL (ref 0.00–0.07)
Basophils Absolute: 0.1 10*3/uL (ref 0.0–0.1)
Basophils Relative: 1 %
Eosinophils Absolute: 0.4 10*3/uL (ref 0.0–0.5)
Eosinophils Relative: 5 %
HCT: 39.8 % (ref 39.0–52.0)
Hemoglobin: 14.2 g/dL (ref 13.0–17.0)
Immature Granulocytes: 0 %
Lymphocytes Relative: 22 %
Lymphs Abs: 1.8 10*3/uL (ref 0.7–4.0)
MCH: 31.7 pg (ref 26.0–34.0)
MCHC: 35.7 g/dL (ref 30.0–36.0)
MCV: 88.8 fL (ref 80.0–100.0)
Monocytes Absolute: 0.7 10*3/uL (ref 0.1–1.0)
Monocytes Relative: 9 %
Neutro Abs: 5.4 10*3/uL (ref 1.7–7.7)
Neutrophils Relative %: 63 %
Platelet Count: 181 10*3/uL (ref 150–400)
RBC: 4.48 MIL/uL (ref 4.22–5.81)
RDW: 12 % (ref 11.5–15.5)
WBC Count: 8.5 10*3/uL (ref 4.0–10.5)
nRBC: 0 % (ref 0.0–0.2)

## 2023-05-10 LAB — CMP (CANCER CENTER ONLY)
ALT: 18 U/L (ref 0–44)
AST: 18 U/L (ref 15–41)
Albumin: 4.2 g/dL (ref 3.5–5.0)
Alkaline Phosphatase: 63 U/L (ref 38–126)
Anion gap: 6 (ref 5–15)
BUN: 21 mg/dL (ref 8–23)
CO2: 29 mmol/L (ref 22–32)
Calcium: 8.1 mg/dL — ABNORMAL LOW (ref 8.9–10.3)
Chloride: 103 mmol/L (ref 98–111)
Creatinine: 1.25 mg/dL — ABNORMAL HIGH (ref 0.61–1.24)
GFR, Estimated: >60 mL/min (ref >60)
Glucose, Bld: 132 mg/dL — ABNORMAL HIGH (ref 70–99)
Potassium: 3.8 mmol/L (ref 3.5–5.1)
Sodium: 138 mmol/L (ref 135–145)
Total Bilirubin: 0.5 mg/dL (ref 0.3–1.2)
Total Protein: 6.9 g/dL (ref 6.5–8.1)

## 2023-05-10 MED ORDER — SODIUM CHLORIDE 0.9 % IV SOLN: Freq: Once | INTRAVENOUS | Status: AC

## 2023-05-10 MED ORDER — SODIUM CHLORIDE 0.9% FLUSH
10.0000 mL | Freq: Once | INTRAVENOUS | Status: AC
  Administered 2023-05-10: 10 mL

## 2023-05-10 MED ORDER — HEPARIN SOD (PORK) LOCK FLUSH 100 UNIT/ML IV SOLN
500.0000 [IU] | Freq: Once | INTRAVENOUS | Status: AC | PRN
  Administered 2023-05-10: 500 [IU]

## 2023-05-10 MED ORDER — SODIUM CHLORIDE 0.9 % IV SOLN
1500.0000 mg | Freq: Once | INTRAVENOUS | Status: AC
  Administered 2023-05-10: 1500 mg via INTRAVENOUS
  Filled 2023-05-10: qty 30

## 2023-05-10 MED ORDER — SODIUM CHLORIDE 0.9% FLUSH
10.0000 mL | INTRAVENOUS | Status: DC | PRN
  Administered 2023-05-10: 10 mL

## 2023-05-10 NOTE — Patient Instructions (Signed)
[place] [facility]  Discharge Instructions: Thank you for choosing [facility] to provide your oncology and hematology care.   If you have a lab appointment with the Cancer Center, please go directly to the Cancer Center and check in at the registration area.   Wear comfortable clothing and clothing appropriate for easy access to any Portacath or PICC line.   We strive to give you quality time with your provider. You may need to reschedule your appointment if you arrive late (15 or more minutes).  Arriving late affects you and other patients whose appointments are after yours.  Also, if you miss three or more appointments without notifying the office, you may be dismissed from the clinic at the provider's discretion.      For prescription refill requests, have your pharmacy contact our office and allow 72 hours for refills to be completed.    Today you received the following chemotherapy and/or immunotherapy agent: Durvalumab (Imfinzi)   To help prevent nausea and vomiting after your treatment, we encourage you to take your nausea medication as directed.  BELOW ARE SYMPTOMS THAT SHOULD BE REPORTED IMMEDIATELY: *FEVER GREATER THAN 100.4 F (38 C) OR HIGHER *CHILLS OR SWEATING *NAUSEA AND VOMITING THAT IS NOT CONTROLLED WITH YOUR NAUSEA MEDICATION *UNUSUAL SHORTNESS OF BREATH *UNUSUAL BRUISING OR BLEEDING *URINARY PROBLEMS (pain or burning when urinating, or frequent urination) *BOWEL PROBLEMS (unusual diarrhea, constipation, pain near the anus) TENDERNESS IN MOUTH AND THROAT WITH OR WITHOUT PRESENCE OF ULCERS (sore throat, sores in mouth, or a toothache) UNUSUAL RASH, SWELLING OR PAIN  UNUSUAL VAGINAL DISCHARGE OR ITCHING   Items with * indicate a potential emergency and should be followed up as soon as possible or go to the Emergency Department if any problems should occur.  Please show the CHEMOTHERAPY ALERT CARD or IMMUNOTHERAPY ALERT CARD  at check-in to the Emergency Department and triage nurse.  Should you have questions after your visit or need to cancel or reschedule your appointment, please contact [place] [facility]  Dept: [phone]  and follow the prompts.  Office hours are 8:00 a.m. to 4:30 p.m. Monday - Friday. Please note that voicemails left after 4:00 p.m. may not be returned until the following business day.  We are closed weekends and major holidays. You have access to a nurse at all times for urgent questions. Please call the main number to the clinic Dept: [phone] and follow the prompts.   For any non-urgent questions, you may also contact your provider using MyChart. We now offer e-Visits for anyone 10 and older to request care online for non-urgent symptoms. For details visit [email or web address].   Also download the MyChart app! Go to the app store, search "MyChart", open the app, select [place], and log in with your MyChart username and password.

## 2023-05-12 ENCOUNTER — Other Ambulatory Visit: Payer: Self-pay

## 2023-05-13 ENCOUNTER — Ambulatory Visit
Admission: RE | Admit: 2023-05-13 | Discharge: 2023-05-13 | Disposition: A | Discharge status: Home / Self Care | Payer: Medicare HMO | Source: Ambulatory Visit | Attending: Radiation Oncology | Admitting: Radiation Oncology

## 2023-05-13 ENCOUNTER — Encounter: Payer: Self-pay | Admitting: Radiation Oncology

## 2023-05-13 ENCOUNTER — Inpatient Hospital Stay: Payer: Medicare HMO

## 2023-05-13 VITALS — BP 104/64 | HR 54 | Temp 97.5°F | Resp 20 | Ht 70.0 in | Wt 155.6 lb

## 2023-05-13 DIAGNOSIS — Z79890 Hormone replacement therapy (postmenopausal): Secondary | ICD-10-CM | POA: Diagnosis not present

## 2023-05-13 DIAGNOSIS — Z79899 Other long term (current) drug therapy: Secondary | ICD-10-CM | POA: Insufficient documentation

## 2023-05-13 DIAGNOSIS — Z86010 Personal history of colonic polyps: Secondary | ICD-10-CM | POA: Diagnosis not present

## 2023-05-13 DIAGNOSIS — J439 Emphysema, unspecified: Secondary | ICD-10-CM | POA: Diagnosis not present

## 2023-05-13 DIAGNOSIS — C7490 Malignant neoplasm of unspecified part of unspecified adrenal gland: Secondary | ICD-10-CM

## 2023-05-13 DIAGNOSIS — I10 Essential (primary) hypertension: Secondary | ICD-10-CM | POA: Insufficient documentation

## 2023-05-13 DIAGNOSIS — C7972 Secondary malignant neoplasm of left adrenal gland: Secondary | ICD-10-CM | POA: Insufficient documentation

## 2023-05-13 DIAGNOSIS — E039 Hypothyroidism, unspecified: Secondary | ICD-10-CM | POA: Diagnosis not present

## 2023-05-13 DIAGNOSIS — C7931 Secondary malignant neoplasm of brain: Secondary | ICD-10-CM | POA: Diagnosis not present

## 2023-05-13 DIAGNOSIS — F17210 Nicotine dependence, cigarettes, uncomplicated: Secondary | ICD-10-CM | POA: Diagnosis not present

## 2023-05-13 DIAGNOSIS — C3432 Malignant neoplasm of lower lobe, left bronchus or lung: Secondary | ICD-10-CM | POA: Insufficient documentation

## 2023-05-13 DIAGNOSIS — G4730 Sleep apnea, unspecified: Secondary | ICD-10-CM | POA: Diagnosis not present

## 2023-05-13 DIAGNOSIS — C7971 Secondary malignant neoplasm of right adrenal gland: Secondary | ICD-10-CM | POA: Diagnosis not present

## 2023-05-13 DIAGNOSIS — R0989 Other specified symptoms and signs involving the circulatory and respiratory systems: Secondary | ICD-10-CM | POA: Insufficient documentation

## 2023-05-13 DIAGNOSIS — Z923 Personal history of irradiation: Secondary | ICD-10-CM | POA: Insufficient documentation

## 2023-05-13 NOTE — Progress Notes (Signed)
Radiation Oncology         [phone] ________________________________  Outpatient Re-consultation   Name: Brett Campbell        MRN: [id number]  Date of Service: 05/13/2023 DOB: [birth year]  WU:[id number], Mabeline Caras, MD  Si Gaul, MD     REFERRING PHYSICIAN: Si Gaul, MD   DIAGNOSIS:  Extensive Stage Small Cell Lung Cancer.    HISTORY OF PRESENT ILLNESS: Brett Campbell is a 62 y.o. 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 He was diagnosed on 07/24/22 when a bronchoscopy was performed and cytology of the LLL confirmed small cell carcinoma. He began systemic chemotherapy and immunotherapy on 08/10/22. He was found to have brain disease and went on to receive whole brain radiation which he completed on 10/02/22. After completing chemotherapy in March 2024, he  has continued with  immunotherapy with Dr. Arbutus Ped.   He continues to be followed with our department with surveillance MRI scans of the brain. His most recent on 04/05/23 showed no new or progressive disease. He has two stable subcentimeter areas of enhancement in the posterior pons, a decrease in the left posterior frontal cortex lesion, and a right frontal lesion is no longer seen. He continues to have stable endolymphatic sacs were noted and chronic right  mastoid opacification are noted.  Since his last visit, the CT scan that he had on 04/09/2023 showed the known left adrenal nodule measuring 3.8 cm where it had previously measured 1.5 cm on scans in May 2024.  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  No other areas of disease were noted and he is seen to consider a palliative course of radiotherapy while he continues immunotherapy.  PREVIOUS RADIATION THERAPY:    09/21/2022 through 10/02/2022 Site Technique Total Dose (Gy) Dose per Fx (Gy) Completed Fx Beam Energies  Brain:  Whole Brain Complex 30/30 3 10/10 6X     PAST MEDICAL HISTORY:  Past Medical History:   Diagnosis Date   Calcium deficiency    Cataract    COPD (chronic obstructive pulmonary disease) (HCC)    moderate emphysema   Deaf    Hypertension    Hypothyroidism    Personal history of colonic polyps 11/03/2020   Pneumonia    August 2023   Scarlet fever    Sleep apnea    never used a Cpap, used to use O2   Thyroid disease    Tuberculosis    [place] Health department is contact with him due to hx.       PAST SURGICAL HISTORY: Past Surgical History:  Procedure Laterality Date   CHOLECYSTECTOMY     COLONOSCOPY     +5years   IR IMAGING GUIDED PORT INSERTION  08/14/2022   LAPAROSCOPIC RIGHT HEMI COLECTOMY N/A 02/26/2021   Procedure: LAPAROSCOPIC RIGHT HEMI COLECTOMY;  Surgeon: Andria Meuse, MD;  Location: [facility];  Service: General;  Laterality: N/A;   THYROIDECTOMY     VIDEO BRONCHOSCOPY WITH ENDOBRONCHIAL ULTRASOUND N/A 07/24/2022   Procedure: VIDEO BRONCHOSCOPY WITH ENDOBRONCHIAL ULTRASOUND  WITH LEFT LOWER LOBE  MASS BIOPSIES;  Surgeon: Martina Sinner, MD;  Location: [facility];  Service: Pulmonary;  Laterality: N/A;     FAMILY HISTORY:  Family History  Problem Relation Age of Onset   CAD Mother    Diabetes Mellitus II Mother    Diabetes Mother    CAD Father    Heart attack Father    Heart disease Father  Diabetes Sister    Colon cancer Neg Hx    Esophageal cancer Neg Hx    Stomach cancer Neg Hx    Colon polyps Neg Hx      SOCIAL HISTORY:  reports that he has been smoking cigarettes. He has a 11.3 pack-year smoking history. He has never used smokeless tobacco. He reports current alcohol use of about 1.0 - 2.0 standard drink of alcohol per week. He reports current drug use. Drug: Marijuana. The patient is single and lives with his sister [name]. His sister Alexia Freestone lives out of state and joins [place] during the visit by phone. He is deaf and communicates with ASL, and also reads lips.     ALLERGIES: Penicillins   MEDICATIONS:  Current Outpatient Medications   Medication Sig Dispense Refill   amLODipine (NORVASC) 5 MG tablet Take 1 tablet (5 mg total) by mouth daily. 90 tablet 3   atorvastatin (LIPITOR) 10 MG tablet TAKE 1 TABLET EVERY DAY 90 tablet 3   calcitRIOL (ROCALTROL) 0.25 MCG capsule TAKE 1 CAPSULE EVERY DAY 30 capsule 0   calcium carbonate (TUMS) 500 MG chewable tablet Chew 1 tablet (200 mg of elemental calcium total) by mouth 3 (three) times daily. 90 tablet 3   colestipol (COLESTID) 1 g tablet Take 1 tablet (1 g total) by mouth 2 (two) times daily. 60 tablet 3   HYDROcodone bit-homatropine (HYCODAN) 5-1.5 MG/5ML syrup Take 5 mLs by mouth every 6 hours as needed for cough. 240 mL 0   ibuprofen (ADVIL) 200 MG tablet Take 400-800 mg by mouth every 6 (six) hours as needed for moderate pain.     levothyroxine (SYNTHROID) 150 MCG tablet TAKE 1 TABLET EVERY DAY (NEED MD APPOINTMENT) 30 tablet 0   lidocaine-prilocaine (EMLA) cream Apply to affected area once 30 g 3   loperamide (IMODIUM A-D) 2 MG tablet Take 1 tablet (2 mg total) by mouth 4 (four) times daily as needed for diarrhea or loose stools. 30 tablet 0   ondansetron (ZOFRAN) 8 MG tablet Take 1 tablet (8 mg total) by mouth every 8 (eight) hours as needed for nausea, vomiting or refractory nausea / vomiting. Start on the third day after carboplatin. (Patient taking differently: Take 8 mg by mouth every 6 (six) hours as needed for nausea, vomiting or refractory nausea / vomiting. Start on the third day after carboplatin.) 30 tablet 1   potassium chloride SA (KLOR-CON M) 20 MEQ tablet TAKE 1 TABLET EVERY DAY 90 tablet 3   prochlorperazine (COMPAZINE) 10 MG tablet Take 1 tablet (10 mg total) by mouth every 6 (six) hours as needed. (Patient taking differently: Take 10 mg by mouth every 6 (six) hours as needed for nausea or vomiting.) 30 tablet 2   triamcinolone cream (KENALOG) 0.1 % Apply 1 Application topically 2 (two) times daily as needed. 453.6 g 0   No current facility-administered medications  for this visit.     REVIEW OF SYSTEMS: On review of systems, the patient states he is doing pretty well. He has had a few days of cough without fever, and a runny nose, but no headaches or verbalized sinus pressure. He has also noted nausea related to the time of his immunotherapy. He has antiemetics that help with his symptoms. He is not having any pain in his abdomen, or verbalized difficulty with bowel function. No other complaints are verbalized.      PHYSICAL EXAM:  Wt Readings from Last 3 Encounters:  05/13/23 155 lb 9.6 oz (70.6  kg)  05/10/23 157 lb 2 oz (71.3 kg)  04/12/23 157 lb (71.2 kg)   Temp Readings from Last 3 Encounters:  05/13/23 (!) 97.5 F (36.4 C) (Oral)  05/10/23 97.7 F (36.5 C) (Oral)  05/10/23 97.6 F (36.4 C) (Oral)   BP Readings from Last 3 Encounters:  05/13/23 104/64  05/10/23 127/76  05/10/23 106/68   Pulse Readings from Last 3 Encounters:  05/13/23 (!) 54  05/10/23 (!) 57  05/10/23 (!) 53   In general this is a overall stable appearing caucasian male in no acute distress. He's alert and oriented x4 and appropriate throughout the examination. Cardiopulmonary assessment is negative for acute distress and he exhibits normal effort.      ECOG = 1  0 - Asymptomatic (Fully active, able to carry on all predisease activities without restriction)  1 - Symptomatic but completely ambulatory (Restricted in physically strenuous activity but ambulatory and able to carry out work of a light or sedentary nature. For example, light housework, office work)  2 - Symptomatic, <50% in bed during the day (Ambulatory and capable of all self care but unable to carry out any work activities. Up and about more than 50% of waking hours)  3 - Symptomatic, >50% in bed, but not bedbound (Capable of only limited self-care, confined to bed or chair 50% or more of waking hours)  4 - Bedbound (Completely disabled. Cannot carry on any self-care. Totally confined to bed or  chair)  5 - Death   Santiago Glad MM, Creech RH, Tormey DC, et al. [phone]). "Toxicity and response criteria of the [facility]". Am. Evlyn Clines. Oncol. 5 (6): 649-55    LABORATORY DATA:  Lab Results  Component Value Date   WBC 8.5 05/10/2023   HGB 14.2 05/10/2023   HCT 39.8 05/10/2023   MCV 88.8 05/10/2023   PLT 181 05/10/2023   Lab Results  Component Value Date   NA 138 05/10/2023   K 3.8 05/10/2023   CL 103 05/10/2023   CO2 29 05/10/2023   Lab Results  Component Value Date   ALT 18 05/10/2023   AST 18 05/10/2023   ALKPHOS 63 05/10/2023   BILITOT 0.5 05/10/2023      RADIOGRAPHY: No results found.     IMPRESSION/PLAN: 1. 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 The patient's recent imaging shows progressive left adrenal disease and new right adrenal disease. He will continue immunotherapy since the only progressive disease is in the adrenal glands. Dr. Mitzi Hansen discusses the rationale for a palliative course of radiotherapy to bilateral adrenal glands. We discussed the risks, benefits, short, and long term effects of radiotherapy, as well as the palliative intent, and the patient is interested in proceeding. Dr. Mitzi Hansen discusses the delivery and logistics of radiotherapy and anticipates a course of 2 weeks of radiotherapy. Written consent is obtained and placed in the chart, a copy was provided to the patient. The patient will be contacted to coordinate treatment planning by our simulation department. I will also send a message to his endocrinologist that we will be treating his adrenal glands in case he develops symptoms of adrenal insufficiency as a result. His sister states he has he has not seen Dr. Brooks Sailors since he was diagnosed with lung cancer so we appreciate her input regarding his case. 2. Cough and runny nose. I encourage the patient to have covid testing and his sister states he can test at home. If he  has  progressive symptoms of cough or develops shortness of breath he should report these symptoms to medical oncology or be seen in an urgent setting. 3. Mastoid opacification. As previously this is noted but has not created changes in the patient's quality of life but can be revisited if that becomes problematic.  In a visit lasting 60 minutes, greater than 50% of the time was spent face to face time with the assistance of an ASL interpreter discussing the patient's condition, in preparation for the discussion, and coordinating the patient's care.      Osker Mason, [facility]   **Disclaimer: This note was dictated with voice recognition software. Similar sounding words can inadvertently be transcribed and this note may contain transcription errors which may not have been corrected upon publication of note.**

## 2023-05-13 NOTE — Progress Notes (Signed)
Nursing interview for Carcinoma of bilateral adrenal glands Patient identity verified x2.  Patient reports fatigue but NO ABD discomfort currently. No other issues conveyed at this time.  Meaningful use complete.  Vitals- BP 104/64 (BP Location: Left Arm, Patient Position: Sitting, Cuff Size: Large)   Pulse (!) 54   Temp (!) 97.5 F (36.4 C) (Oral)   Resp 20   Ht 5\' 10"  (1.778 m)   Wt 155 lb 9.6 oz (70.6 kg)   SpO2 99%   BMI 22.33 kg/m   This concludes the interaction.  Ruel Favors, LPN

## 2023-05-14 DIAGNOSIS — C7972 Secondary malignant neoplasm of left adrenal gland: Secondary | ICD-10-CM | POA: Diagnosis not present

## 2023-05-14 DIAGNOSIS — C3432 Malignant neoplasm of lower lobe, left bronchus or lung: Secondary | ICD-10-CM | POA: Diagnosis not present

## 2023-05-14 DIAGNOSIS — F17210 Nicotine dependence, cigarettes, uncomplicated: Secondary | ICD-10-CM | POA: Diagnosis not present

## 2023-05-14 DIAGNOSIS — C7971 Secondary malignant neoplasm of right adrenal gland: Secondary | ICD-10-CM | POA: Diagnosis not present

## 2023-05-14 NOTE — Radiation Completion Notes (Signed)
Patient Name: Brett Campbell, Brett Campbell MRN: [id number] Date of Birth: July 06, 1961 Referring Physician: Melody Comas, M.D. Date of Service: 2023-05-14 Radiation Oncologist: Dorothy Puffer, M.D. 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 Staging on 2022-07-31: Small cell lung cancer (HCC) T=cT3, N=cN0, M=cM1c Intent: Palliative     ==========DELIVERED PLANS==========  First Treatment Date: 2022-09-21 - Last Treatment Date: 2022-10-02   Plan Name: Brain_Whole Site: Brain Technique: Isodose Plan Mode: Photon Dose Per Fraction: 3 Gy Prescribed Dose (Delivered / Prescribed): 30 Gy / 30 Gy Prescribed Fxs (Delivered / Prescribed): 10 / 10     ==========ON TREATMENT VISIT DATES========== 2022-09-25, 2022-10-02     ==========UPCOMING VISITS==========       ==========APPENDIX - ON TREATMENT VISIT NOTES==========   See weekly On Treatment Notes in Epic for details.

## 2023-05-18 ENCOUNTER — Other Ambulatory Visit: Payer: Self-pay

## 2023-05-19 ENCOUNTER — Telehealth: Payer: Self-pay

## 2023-05-19 ENCOUNTER — Other Ambulatory Visit: Payer: Self-pay

## 2023-05-19 DIAGNOSIS — I10 Essential (primary) hypertension: Secondary | ICD-10-CM

## 2023-05-19 MED ORDER — AMLODIPINE BESYLATE 5 MG PO TABS: 5.0000 mg | ORAL_TABLET | Freq: Every day | ORAL | 3 refills | Status: DC

## 2023-05-19 NOTE — Telephone Encounter (Signed)
Pt sister is requesting a refill for  amLODipine (NORVASC) 5 MG tablet   Pharmacy: [id number] - [place] ([place]), [place] - [address]

## 2023-05-19 NOTE — Telephone Encounter (Signed)
Rx has been sent  

## 2023-05-25 ENCOUNTER — Inpatient Hospital Stay: Payer: Medicare HMO | Attending: Physician Assistant

## 2023-05-25 ENCOUNTER — Ambulatory Visit
Admission: RE | Admit: 2023-05-25 | Discharge: 2023-05-25 | Disposition: A | Discharge status: Home / Self Care | Payer: Medicare HMO | Source: Ambulatory Visit | Attending: Radiation Oncology | Admitting: Radiation Oncology

## 2023-05-25 DIAGNOSIS — Z510 Encounter for antineoplastic radiation therapy: Secondary | ICD-10-CM | POA: Diagnosis not present

## 2023-05-25 DIAGNOSIS — F17210 Nicotine dependence, cigarettes, uncomplicated: Secondary | ICD-10-CM | POA: Diagnosis not present

## 2023-05-25 DIAGNOSIS — C7951 Secondary malignant neoplasm of bone: Secondary | ICD-10-CM | POA: Insufficient documentation

## 2023-05-25 DIAGNOSIS — Z5112 Encounter for antineoplastic immunotherapy: Secondary | ICD-10-CM | POA: Insufficient documentation

## 2023-05-25 DIAGNOSIS — C3432 Malignant neoplasm of lower lobe, left bronchus or lung: Secondary | ICD-10-CM | POA: Insufficient documentation

## 2023-05-25 DIAGNOSIS — C7971 Secondary malignant neoplasm of right adrenal gland: Secondary | ICD-10-CM | POA: Diagnosis not present

## 2023-05-25 DIAGNOSIS — C7972 Secondary malignant neoplasm of left adrenal gland: Secondary | ICD-10-CM | POA: Diagnosis not present

## 2023-05-25 DIAGNOSIS — R634 Abnormal weight loss: Secondary | ICD-10-CM | POA: Insufficient documentation

## 2023-05-25 DIAGNOSIS — C7931 Secondary malignant neoplasm of brain: Secondary | ICD-10-CM | POA: Insufficient documentation

## 2023-05-27 ENCOUNTER — Other Ambulatory Visit: Payer: Self-pay | Admitting: Physician Assistant

## 2023-05-28 ENCOUNTER — Other Ambulatory Visit: Payer: Self-pay

## 2023-05-28 MED ORDER — LEVOTHYROXINE SODIUM 150 MCG PO TABS: ORAL_TABLET | ORAL | 0 refills | Status: DC

## 2023-05-31 ENCOUNTER — Ambulatory Visit (INDEPENDENT_AMBULATORY_CARE_PROVIDER_SITE_OTHER): Payer: Medicare HMO | Admitting: Family Medicine

## 2023-05-31 ENCOUNTER — Encounter: Payer: Self-pay | Admitting: Family Medicine

## 2023-05-31 VITALS — BP 121/80 | HR 63 | Ht 70.0 in | Wt 153.0 lb

## 2023-05-31 DIAGNOSIS — Z23 Encounter for immunization: Secondary | ICD-10-CM

## 2023-05-31 DIAGNOSIS — R634 Abnormal weight loss: Secondary | ICD-10-CM | POA: Diagnosis not present

## 2023-05-31 DIAGNOSIS — E782 Mixed hyperlipidemia: Secondary | ICD-10-CM | POA: Diagnosis not present

## 2023-05-31 DIAGNOSIS — E876 Hypokalemia: Secondary | ICD-10-CM | POA: Diagnosis not present

## 2023-05-31 DIAGNOSIS — I10 Essential (primary) hypertension: Secondary | ICD-10-CM | POA: Diagnosis not present

## 2023-05-31 DIAGNOSIS — E039 Hypothyroidism, unspecified: Secondary | ICD-10-CM

## 2023-05-31 DIAGNOSIS — K529 Noninfective gastroenteritis and colitis, unspecified: Secondary | ICD-10-CM | POA: Insufficient documentation

## 2023-05-31 DIAGNOSIS — E58 Dietary calcium deficiency: Secondary | ICD-10-CM

## 2023-05-31 MED ORDER — LEVOTHYROXINE SODIUM 150 MCG PO TABS: ORAL_TABLET | ORAL | 3 refills | Status: DC

## 2023-05-31 MED ORDER — ATORVASTATIN CALCIUM 10 MG PO TABS
10.0000 mg | ORAL_TABLET | Freq: Every day | ORAL | 3 refills | Status: DC
Start: 2023-05-31 — End: 2023-09-13

## 2023-05-31 MED ORDER — POTASSIUM CHLORIDE CRYS ER 20 MEQ PO TBCR
20.0000 meq | EXTENDED_RELEASE_TABLET | Freq: Every day | ORAL | 3 refills | Status: DC
Start: 2023-05-31 — End: 2023-09-13

## 2023-05-31 MED ORDER — CALCITRIOL 0.25 MCG PO CAPS: 0.2500 ug | ORAL_CAPSULE | Freq: Every day | ORAL | 3 refills | Status: DC

## 2023-05-31 MED ORDER — AMLODIPINE BESYLATE 5 MG PO TABS: 5.0000 mg | ORAL_TABLET | Freq: Every day | ORAL | 3 refills | Status: DC

## 2023-05-31 MED ORDER — COLESTIPOL HCL 1 G PO TABS: 1.0000 g | ORAL_TABLET | Freq: Two times a day (BID) | ORAL | 3 refills | Status: DC

## 2023-05-31 NOTE — Assessment & Plan Note (Signed)
Calcitriol sent in.

## 2023-05-31 NOTE — Progress Notes (Signed)
Established Patient Office Visit  Subjective   Patient ID: Brett Campbell, male    DOB: [birth year]  Age: 62 y.o. MRN: [id number]  Chief Complaint  Patient presents with   Medical Management of Chronic Issues    HPI Patient is accompanied today by his sister and the sign language interpreter.  Currently undergoing immunotherapy for metastatic lung cancer.  Has had radiation to his brain in the past.  Next week starts radiation to the adrenal glands.  Overall patient appears to be tolerating his treatment well.  Complains of being "a little tired" but otherwise no complaints or concerns.  Patient needs refills of his medications sent to the online pharmacy.  We discussed the medications he is taking.  No concerns will need his medications.  He is taking cholestyramine for continued diarrhea.  Patient's sister says that he has a issues with low blood pressure in the past, but they are unable to say what his low blood pressure readings were.  Patient was treated at hospital several months ago for low blood pressure but this was due to poor p.o. intake.  Patient has had some weight loss recently.  He said since appetite is decreased.  He does not take protein shakes because he does not like their taste and the ensures gave him diarrhea.  We discussed protein bars.  Recommended increased protein intake and increased calorie intake overall.   The 10-year ASCVD risk score (Arnett DK, et al., 2019) is: 14.9%  Health Maintenance Due  Topic Date Due   COVID-19 Vaccine (1) Never done   Hepatitis C Screening  Never done   Medicare Annual Wellness (AWV)  09/23/2022      Objective:     BP 121/80   Pulse 63   Ht 5\' 10"  (1.778 m)   Wt 153 lb (69.4 kg)   SpO2 96%   BMI 21.95 kg/m    Physical Exam General: Alert, oriented.  Thin appearing male. CV: Regular rate and rhythm Pulmonary: Lungs clear bilaterally Psych: Pleasant affect   No results found for any visits on  05/31/23.      Assessment & Plan:   Essential hypertension Assessment & Plan: Patient reports episodes of hypotension, but only documented low blood pressure it was when he was acutely ill and unable to take p.o.  Currently only taking amlodipine.  Send in amlodipine 90-day supply with refill to online pharmacy.  Orders: -     amLODIPine Besylate; Take 1 tablet (5 mg total) by mouth daily.  Dispense: 90 tablet; Refill: 3  Mixed hyperlipidemia Assessment & Plan: Continue statin.  Refill sent in today.  Orders: -     Atorvastatin Calcium; Take 1 tablet (10 mg total) by mouth daily.  Dispense: 90 tablet; Refill: 3  Hypocalcemia -     Calcitriol; Take 1 capsule (0.25 mcg total) by mouth daily.  Dispense: 30 capsule; Refill: 3  Hypokalemia -     Potassium Chloride Crys ER; Take 1 tablet (20 mEq total) by mouth daily.  Dispense: 90 tablet; Refill: 3  Need for influenza vaccination -     Flu vaccine trivalent PF, 6mos and older(Flulaval,Afluria,Fluarix,Fluzone)  Calcium deficiency Assessment & Plan: Calcitriol sent in.   Chronic diarrhea Assessment & Plan: Continue colestipol.  Refill sent in.   Acquired hypothyroidism Assessment & Plan: Most recent TSH normal.  Send in refills to online pharmacy.  Will run out of current refills prior to his appointment with the endocrinologist.   Weight loss Assessment &  Plan: Secondary to malignancy and treatment.  No nausea or vomiting, just decreased appetite.  Encourage patient to increase his protein and caloric intake.  Recommended protein bars due to patient not tolerating protein/nutritional shakes   Other orders -     Colestipol HCl; Take 1 tablet (1 g total) by mouth 2 (two) times daily.  Dispense: 60 tablet; Refill: 3 -     Levothyroxine Sodium; TAKE 1 TABLET EVERY DAY  Dispense: 90 tablet; Refill: 3     Return in about 3 months (around 08/31/2023) for Hypertension, hypothyroid.    [name], MD

## 2023-05-31 NOTE — Assessment & Plan Note (Signed)
Most recent TSH normal.  Send in refills to online pharmacy.  Will run out of current refills prior to his appointment with the endocrinologist.

## 2023-05-31 NOTE — Patient Instructions (Addendum)
It was nice to see you today,  We addressed the following topics today: -I have sent in refills for your medications to your online pharmacy.  They are 90-day supply with refills - To help with your protein intake you can try taking protein bars found in the grocery store.  Try to find protein bars that have 15 to 20 g of protein per bar at least.  It is okay to take multiple once per day.  The extra calories can help prevent weight loss and negative the protein will help prevent muscle loss.  Have a great day,  Frederic Jericho, MD

## 2023-05-31 NOTE — Assessment & Plan Note (Signed)
Continue colestipol.  Refill sent in.

## 2023-05-31 NOTE — Assessment & Plan Note (Addendum)
Patient reports episodes of hypotension, but only documented low blood pressure it was when he was acutely ill and unable to take p.o.  Currently only taking amlodipine.  Send in amlodipine 90-day supply with refill to online pharmacy.

## 2023-05-31 NOTE — Assessment & Plan Note (Signed)
Continue statin.  Refill sent in today.

## 2023-05-31 NOTE — Assessment & Plan Note (Signed)
Secondary to malignancy and treatment.  No nausea or vomiting, just decreased appetite.  Encourage patient to increase his protein and caloric intake.  Recommended protein bars due to patient not tolerating protein/nutritional shakes

## 2023-06-01 ENCOUNTER — Other Ambulatory Visit: Payer: Self-pay

## 2023-06-07 ENCOUNTER — Inpatient Hospital Stay: Payer: Medicare HMO

## 2023-06-07 ENCOUNTER — Inpatient Hospital Stay (HOSPITAL_BASED_OUTPATIENT_CLINIC_OR_DEPARTMENT_OTHER): Payer: Medicare HMO | Admitting: Internal Medicine

## 2023-06-07 VITALS — BP 111/72 | HR 54 | Temp 97.8°F | Resp 17 | Wt 153.0 lb

## 2023-06-07 DIAGNOSIS — C3490 Malignant neoplasm of unspecified part of unspecified bronchus or lung: Secondary | ICD-10-CM | POA: Diagnosis not present

## 2023-06-07 DIAGNOSIS — Z5112 Encounter for antineoplastic immunotherapy: Secondary | ICD-10-CM | POA: Diagnosis not present

## 2023-06-07 DIAGNOSIS — C3432 Malignant neoplasm of lower lobe, left bronchus or lung: Secondary | ICD-10-CM

## 2023-06-07 DIAGNOSIS — C7972 Secondary malignant neoplasm of left adrenal gland: Secondary | ICD-10-CM | POA: Diagnosis not present

## 2023-06-07 DIAGNOSIS — C7971 Secondary malignant neoplasm of right adrenal gland: Secondary | ICD-10-CM | POA: Insufficient documentation

## 2023-06-07 DIAGNOSIS — C7931 Secondary malignant neoplasm of brain: Secondary | ICD-10-CM | POA: Diagnosis not present

## 2023-06-07 DIAGNOSIS — F17210 Nicotine dependence, cigarettes, uncomplicated: Secondary | ICD-10-CM | POA: Diagnosis not present

## 2023-06-07 DIAGNOSIS — R634 Abnormal weight loss: Secondary | ICD-10-CM | POA: Diagnosis not present

## 2023-06-07 LAB — CMP (CANCER CENTER ONLY)
ALT: 14 U/L (ref 0–44)
AST: 17 U/L (ref 15–41)
Albumin: 4.2 g/dL (ref 3.5–5.0)
Alkaline Phosphatase: 63 U/L (ref 38–126)
Anion gap: 6 (ref 5–15)
BUN: 16 mg/dL (ref 8–23)
CO2: 29 mmol/L (ref 22–32)
Calcium: 8.2 mg/dL — ABNORMAL LOW (ref 8.9–10.3)
Chloride: 105 mmol/L (ref 98–111)
Creatinine: 1.15 mg/dL (ref 0.61–1.24)
GFR, Estimated: >60 mL/min (ref >60)
Glucose, Bld: 98 mg/dL (ref 70–99)
Potassium: 3.9 mmol/L (ref 3.5–5.1)
Sodium: 140 mmol/L (ref 135–145)
Total Bilirubin: 0.4 mg/dL (ref 0.3–1.2)
Total Protein: 6.9 g/dL (ref 6.5–8.1)

## 2023-06-07 LAB — CBC WITH DIFFERENTIAL (CANCER CENTER ONLY)
Abs Immature Granulocytes: 0.02 10*3/uL (ref 0.00–0.07)
Basophils Absolute: 0.1 10*3/uL (ref 0.0–0.1)
Basophils Relative: 1 %
Eosinophils Absolute: 0.4 10*3/uL (ref 0.0–0.5)
Eosinophils Relative: 5 %
HCT: 38.4 % — ABNORMAL LOW (ref 39.0–52.0)
Hemoglobin: 14.1 g/dL (ref 13.0–17.0)
Immature Granulocytes: 0 %
Lymphocytes Relative: 22 %
Lymphs Abs: 2 10*3/uL (ref 0.7–4.0)
MCH: 32.6 pg (ref 26.0–34.0)
MCHC: 36.7 g/dL — ABNORMAL HIGH (ref 30.0–36.0)
MCV: 88.9 fL (ref 80.0–100.0)
Monocytes Absolute: 0.8 10*3/uL (ref 0.1–1.0)
Monocytes Relative: 9 %
Neutro Abs: 5.4 10*3/uL (ref 1.7–7.7)
Neutrophils Relative %: 63 %
Platelet Count: 175 10*3/uL (ref 150–400)
RBC: 4.32 MIL/uL (ref 4.22–5.81)
RDW: 11.9 % (ref 11.5–15.5)
WBC Count: 8.7 10*3/uL (ref 4.0–10.5)
nRBC: 0 % (ref 0.0–0.2)

## 2023-06-07 MED ORDER — SODIUM CHLORIDE 0.9 % IV SOLN: Freq: Once | INTRAVENOUS | Status: AC

## 2023-06-07 MED ORDER — HEPARIN SOD (PORK) LOCK FLUSH 100 UNIT/ML IV SOLN
500.0000 [IU] | Freq: Once | INTRAVENOUS | Status: AC | PRN
  Administered 2023-06-07: 500 [IU]

## 2023-06-07 MED ORDER — SODIUM CHLORIDE 0.9% FLUSH
10.0000 mL | INTRAVENOUS | Status: DC | PRN
  Administered 2023-06-07: 10 mL

## 2023-06-07 MED ORDER — SODIUM CHLORIDE 0.9 % IV SOLN
1500.0000 mg | Freq: Once | INTRAVENOUS | Status: AC
  Administered 2023-06-07: 1500 mg via INTRAVENOUS
  Filled 2023-06-07: qty 30

## 2023-06-07 NOTE — Progress Notes (Signed)
[facility] Telephone:([phone]   [phone]  OFFICE PROGRESS NOTE  Brett Kitty, MD [address] [place] [address]  DIAGNOSIS: Extensive stage (T4, N3, M1 C) small cell lung cancer presented with obstructing left lower lobe lung mass with left extrapleural lymph node, bilateral adrenal metastasis, pancreatic lesion as well as upper abdominal lymphadenopathy and soft tissue nodules in the abdominal wall as well as the abdomen diagnosed in December 2023.  PRIOR THERAPY: Whole brain irradiation for brain metastasis.  CURRENT THERAPY: Systemic chemotherapy with carboplatin for AUC of 5 on day 1, etoposide 100 Mg/M2 on days 1, 2 and 3 with Cosela 240 Mg/M2 on the days of the chemotherapy and Imfinzi 1500 Mg IV every 3 weeks.  First dose August 10, 2022.  Status post 11 cycles.  Starting from cycle #5 he will be on maintenance treatment with Imfinzi 1500 Mg IV every 4 weeks.  INTERVAL HISTORY: Brett Campbell 62 y.o. male returns to the clinic today for follow-up visit accompanied by his sister and his sign interpreter.Discussed the use of AI scribe software for clinical note transcription with the patient, who gave verbal consent to proceed.  History of Present Illness   The patient, a 62 year old male with a recent diagnosis of extensive stage small cell lung cancer, presents for a follow-up visit. He has been undergoing chemotherapy every four weeks and is scheduled for palliative radiation therapy to bilateral adrenal gland metastases.  The patient reports experiencing diarrhea since starting chemotherapy. He makes frequent trips to the bathroom, approximately two times during the day and twice at night. He has been taking colestipol in the morning and afternoon to manage these symptoms.  Despite these side effects, the patient has maintained a consistent weight, with a minor loss of three pounds noted over the past few months. He reports no  significant fatigue, continues to consume three meals a day, and does not overeat.  The patient denies experiencing any chest pain or breathing issues. He also denies any headaches or changes in vision. However, he continues to smoke cigarettes and cannabis, despite his cancer diagnosis.  The patient's blood pressure has been slightly low, and he has been experiencing a persistent cough. He has been struggling with maintaining adequate hydration, often preferring juice over water. Despite these challenges, the patient has been tolerating the chemotherapy regimen.      MEDICAL HISTORY: Past Medical History:  Diagnosis Date   Calcium deficiency    Cataract    COPD (chronic obstructive pulmonary disease) (HCC)    moderate emphysema   Deaf    Hypertension    Hypothyroidism    Personal history of colonic polyps 11/03/2020   Pneumonia    August 2023   Scarlet fever    Sleep apnea    never used a Cpap, used to use O2   Thyroid disease    Tuberculosis    [place]. Health department is contact with him due to hx.    ALLERGIES:  is allergic to penicillins.  MEDICATIONS:  Current Outpatient Medications  Medication Sig Dispense Refill   amLODipine (NORVASC) 5 MG tablet Take 1 tablet (5 mg total) by mouth daily. 90 tablet 3   atorvastatin (LIPITOR) 10 MG tablet Take 1 tablet (10 mg total) by mouth daily. 90 tablet 3   calcitRIOL (ROCALTROL) 0.25 MCG capsule Take 1 capsule (0.25 mcg total) by mouth daily. 30 capsule 3   calcium carbonate (TUMS) 500 MG chewable tablet  Chew 1 tablet (200 mg of elemental calcium total) by mouth 3 (three) times daily. 90 tablet 3   colestipol (COLESTID) 1 g tablet Take 1 tablet (1 g total) by mouth 2 (two) times daily. 60 tablet 3   HYDROcodone bit-homatropine (HYCODAN) 5-1.5 MG/5ML syrup Take 5 mLs by mouth every 6 hours as needed for cough. 240 mL 0   ibuprofen (ADVIL) 200 MG tablet Take 400-800 mg by mouth every 6 (six) hours as needed for moderate pain.      levothyroxine (SYNTHROID) 150 MCG tablet TAKE 1 TABLET EVERY DAY 90 tablet 3   lidocaine-prilocaine (EMLA) cream Apply to affected area once 30 g 3   loperamide (IMODIUM A-D) 2 MG tablet Take 1 tablet (2 mg total) by mouth 4 (four) times daily as needed for diarrhea or loose stools. 30 tablet 0   ondansetron (ZOFRAN) 8 MG tablet Take 1 tablet (8 mg total) by mouth every 8 (eight) hours as needed for nausea, vomiting or refractory nausea / vomiting. Start on the third day after carboplatin. (Patient taking differently: Take 8 mg by mouth every 6 (six) hours as needed for nausea, vomiting or refractory nausea / vomiting. Start on the third day after carboplatin.) 30 tablet 1   potassium chloride SA (KLOR-CON M) 20 MEQ tablet Take 1 tablet (20 mEq total) by mouth daily. 90 tablet 3   prochlorperazine (COMPAZINE) 10 MG tablet Take 1 tablet (10 mg total) by mouth every 6 (six) hours as needed. (Patient taking differently: Take 10 mg by mouth every 6 (six) hours as needed for nausea or vomiting.) 30 tablet 2   triamcinolone cream (KENALOG) 0.1 % Apply 1 Application topically 2 (two) times daily as needed. 453.6 g 0   No current facility-administered medications for this visit.    SURGICAL HISTORY:  Past Surgical History:  Procedure Laterality Date   CHOLECYSTECTOMY     COLONOSCOPY     +5years   IR IMAGING GUIDED PORT INSERTION  08/14/2022   LAPAROSCOPIC RIGHT HEMI COLECTOMY N/A 02/26/2021   Procedure: LAPAROSCOPIC RIGHT HEMI COLECTOMY;  Surgeon: Andria Meuse, MD;  Location: [facility];  Service: General;  Laterality: N/A;   THYROIDECTOMY     VIDEO BRONCHOSCOPY WITH ENDOBRONCHIAL ULTRASOUND N/A 07/24/2022   Procedure: VIDEO BRONCHOSCOPY WITH ENDOBRONCHIAL ULTRASOUND  WITH LEFT LOWER LOBE  MASS BIOPSIES;  Surgeon: Martina Sinner, MD;  Location: [facility];  Service: Pulmonary;  Laterality: N/A;    REVIEW OF SYSTEMS:  Constitutional: positive for fatigue Eyes: negative Ears, nose, mouth, throat,  and face: negative Respiratory: positive for cough Cardiovascular: negative Gastrointestinal: positive for diarrhea Genitourinary:negative Integument/breast: negative Hematologic/lymphatic: negative Musculoskeletal:negative Neurological: negative Behavioral/Psych: negative Endocrine: negative Allergic/Immunologic: negative   PHYSICAL EXAMINATION: General appearance: alert, cooperative, and fatigued Head: Normocephalic, without obvious abnormality, atraumatic Neck: no adenopathy, no JVD, supple, symmetrical, trachea midline, and thyroid not enlarged, symmetric, no tenderness/mass/nodules Lymph nodes: Cervical, supraclavicular, and axillary nodes normal. Resp: clear to auscultation bilaterally Back: symmetric, no curvature. ROM normal. No CVA tenderness. Cardio: regular rate and rhythm, S1, S2 normal, no murmur, click, rub or gallop GI: soft, non-tender; bowel sounds normal; no masses,  no organomegaly Extremities: extremities normal, atraumatic, no cyanosis or edema Neurologic: Alert and oriented X 3, normal strength and tone. Normal symmetric reflexes. Normal coordination and gait  ECOG PERFORMANCE STATUS: 1 - Symptomatic but completely ambulatory  Blood pressure 111/72, pulse (!) 54, temperature 97.8 F (36.6 C), temperature source Tympanic, resp. rate 17, weight 153 lb (69.4 kg),  SpO2 100%.  LABORATORY DATA: Lab Results  Component Value Date   WBC 8.7 06/07/2023   HGB 14.1 06/07/2023   HCT 38.4 (L) 06/07/2023   MCV 88.9 06/07/2023   PLT 175 06/07/2023      Chemistry      Component Value Date/Time   NA 138 05/10/2023 0738   NA 139 04/28/2022 1038   K 3.8 05/10/2023 0738   CL 103 05/10/2023 0738   CO2 29 05/10/2023 0738   BUN 21 05/10/2023 0738   BUN 14 04/28/2022 1038   CREATININE 1.25 (H) 05/10/2023 0738      Component Value Date/Time   CALCIUM 8.1 (L) 05/10/2023 0738   ALKPHOS 63 05/10/2023 0738   AST 18 05/10/2023 0738   ALT 18 05/10/2023 0738   BILITOT 0.5  05/10/2023 0738       RADIOGRAPHIC STUDIES: No results found.  ASSESSMENT AND PLAN: This is a very pleasant 62 years old white male with extensive stage (T4, N3, M1 C) small cell lung cancer presented with obstructing left lower lobe lung mass with left extrapleural lymph node, bilateral adrenal metastasis, pancreatic lesion as well as upper abdominal lymphadenopathy and soft tissue nodules in the abdominal wall as well as the abdomen diagnosed in December 2023. The patient started the first cycle of his systemic chemotherapy with carboplatin for AUC of 5 on day 1 and etoposide 100 Mg/M2 on days 1, 2 and 3 with Cosela before the chemotherapy and Imfinzi 1500 Mg IV on day 1 every 3 weeks.  Status post 11 cycle given on August 10, 2022.  Starting from cycle #5 he will be on maintenance treatment with Imfinzi every 4 weeks.    Extensive Stage Small Cell Lung Cancer Tolerating chemotherapy with mild diarrhea and weight loss. No new symptoms of chest pain, breathing issues, headaches, or vision changes. Bilateral adrenal gland metastasis to receive palliative radiation therapy. -Continue current chemotherapy regimen. -Start palliative radiation therapy to bilateral adrenal glands with Dr. Mitzi Hansen. -Encourage increased fluid intake, consider juice to improve hydration. -CT scan of chest, abdomen, and pelvis in 3 weeks to assess disease progression. -Follow-up appointment in 4 weeks.  Tobacco Use Continued smoking despite diagnosis of lung cancer. -Strongly advise cessation of smoking.  Weight Loss Mild weight loss noted, possibly due to decreased appetite and increased trips to the bathroom. -Monitor weight closely. -Encourage consumption of nutrient-dense foods and adequate hydration.   The patient was advised to call immediately if he has any concerning symptoms in the interval. The patient voices understanding of current disease status and treatment options and is in agreement with the  current care plan.  All questions were answered. The patient knows to call the clinic with any problems, questions or concerns. We can certainly see the patient much sooner if necessary.  The total time spent in the appointment was 30 minutes.  Disclaimer: This note was dictated with voice recognition software. Similar sounding words can inadvertently be transcribed and may not be corrected upon review.

## 2023-06-07 NOTE — Patient Instructions (Signed)
[facility] [facility]  Discharge Instructions: Thank you for choosing [facility] to provide your oncology and hematology care.   If you have a lab appointment with the Cancer Center, please go directly to the Cancer Center and check in at the registration area.   Wear comfortable clothing and clothing appropriate for easy access to any Portacath or PICC line.   We strive to give you quality time with your provider. You may need to reschedule your appointment if you arrive late (15 or more minutes).  Arriving late affects you and other patients whose appointments are after yours.  Also, if you miss three or more appointments without notifying the office, you may be dismissed from the clinic at the provider's discretion.      For prescription refill requests, have your pharmacy contact our office and allow 72 hours for refills to be completed.    Today you received the following chemotherapy and/or immunotherapy agent: Durvalumab (Imfinzi)   To help prevent nausea and vomiting after your treatment, we encourage you to take your nausea medication as directed.  BELOW ARE SYMPTOMS THAT SHOULD BE REPORTED IMMEDIATELY: *FEVER GREATER THAN 100.4 F (38 C) OR HIGHER *CHILLS OR SWEATING *NAUSEA AND VOMITING THAT IS NOT CONTROLLED WITH YOUR NAUSEA MEDICATION *UNUSUAL SHORTNESS OF BREATH *UNUSUAL BRUISING OR BLEEDING *URINARY PROBLEMS (pain or burning when urinating, or frequent urination) *BOWEL PROBLEMS (unusual diarrhea, constipation, pain near the anus) TENDERNESS IN MOUTH AND THROAT WITH OR WITHOUT PRESENCE OF ULCERS (sore throat, sores in mouth, or a toothache) UNUSUAL RASH, SWELLING OR PAIN  UNUSUAL VAGINAL DISCHARGE OR ITCHING   Items with * indicate a potential emergency and should be followed up as soon as possible or go to the Emergency Department if any problems should occur.  Please show the CHEMOTHERAPY ALERT CARD or IMMUNOTHERAPY ALERT CARD  at check-in to the Emergency Department and triage nurse.  Should you have questions after your visit or need to cancel or reschedule your appointment, please contact [place] [facility]  Dept: [phone]  and follow the prompts.  Office hours are 8:00 a.m. to 4:30 p.m. Monday - Friday. Please note that voicemails left after 4:00 p.m. may not be returned until the following business day.  We are closed weekends and major holidays. You have access to a nurse at all times for urgent questions. Please call the main number to the clinic Dept: [phone] and follow the prompts.   For any non-urgent questions, you may also contact your provider using MyChart. We now offer e-Visits for anyone 67 and older to request care online for non-urgent symptoms. For details visit [email or web address].   Also download the MyChart app! Go to the app store, search "MyChart", open the app, select [place], and log in with your MyChart username and password.

## 2023-06-08 ENCOUNTER — Ambulatory Visit: Payer: Medicare HMO

## 2023-06-08 DIAGNOSIS — C7972 Secondary malignant neoplasm of left adrenal gland: Secondary | ICD-10-CM | POA: Diagnosis not present

## 2023-06-08 DIAGNOSIS — C7951 Secondary malignant neoplasm of bone: Secondary | ICD-10-CM | POA: Diagnosis not present

## 2023-06-08 DIAGNOSIS — Z510 Encounter for antineoplastic radiation therapy: Secondary | ICD-10-CM | POA: Diagnosis not present

## 2023-06-08 DIAGNOSIS — C7971 Secondary malignant neoplasm of right adrenal gland: Secondary | ICD-10-CM | POA: Diagnosis not present

## 2023-06-08 DIAGNOSIS — C3432 Malignant neoplasm of lower lobe, left bronchus or lung: Secondary | ICD-10-CM | POA: Diagnosis not present

## 2023-06-08 DIAGNOSIS — F17210 Nicotine dependence, cigarettes, uncomplicated: Secondary | ICD-10-CM | POA: Diagnosis not present

## 2023-06-09 ENCOUNTER — Other Ambulatory Visit: Payer: Self-pay | Admitting: Medical Oncology

## 2023-06-09 ENCOUNTER — Other Ambulatory Visit: Payer: Self-pay

## 2023-06-09 ENCOUNTER — Ambulatory Visit
Admission: RE | Admit: 2023-06-09 | Discharge: 2023-06-09 | Disposition: A | Discharge status: Home / Self Care | Payer: Medicare HMO | Source: Ambulatory Visit | Attending: Radiation Oncology | Admitting: Radiation Oncology

## 2023-06-09 DIAGNOSIS — C7951 Secondary malignant neoplasm of bone: Secondary | ICD-10-CM | POA: Diagnosis not present

## 2023-06-09 DIAGNOSIS — C7972 Secondary malignant neoplasm of left adrenal gland: Secondary | ICD-10-CM | POA: Diagnosis not present

## 2023-06-09 DIAGNOSIS — F17210 Nicotine dependence, cigarettes, uncomplicated: Secondary | ICD-10-CM | POA: Diagnosis not present

## 2023-06-09 DIAGNOSIS — C3432 Malignant neoplasm of lower lobe, left bronchus or lung: Secondary | ICD-10-CM | POA: Diagnosis not present

## 2023-06-09 DIAGNOSIS — Z510 Encounter for antineoplastic radiation therapy: Secondary | ICD-10-CM | POA: Diagnosis not present

## 2023-06-09 DIAGNOSIS — C7971 Secondary malignant neoplasm of right adrenal gland: Secondary | ICD-10-CM | POA: Diagnosis not present

## 2023-06-09 LAB — RAD ONC ARIA SESSION SUMMARY
Course Elapsed Days: 0
Plan Fractions Treated to Date: 1
Plan Fractions Treated to Date: 1
Plan Prescribed Dose Per Fraction: 5 Gy
Plan Prescribed Dose Per Fraction: 5 Gy
Plan Total Fractions Prescribed: 10
Plan Total Fractions Prescribed: 10
Plan Total Prescribed Dose: 50 Gy
Plan Total Prescribed Dose: 50 Gy
Reference Point Dosage Given to Date: 5 Gy
Reference Point Dosage Given to Date: 5 Gy
Reference Point Session Dosage Given: 5 Gy
Reference Point Session Dosage Given: 5 Gy
Session Number: 1

## 2023-06-10 ENCOUNTER — Ambulatory Visit
Admission: RE | Admit: 2023-06-10 | Discharge: 2023-06-10 | Disposition: A | Discharge status: Home / Self Care | Payer: Medicare HMO | Source: Ambulatory Visit | Attending: Radiation Oncology | Admitting: Radiation Oncology

## 2023-06-10 ENCOUNTER — Inpatient Hospital Stay: Payer: Medicare HMO

## 2023-06-10 ENCOUNTER — Other Ambulatory Visit: Payer: Self-pay

## 2023-06-10 DIAGNOSIS — Z510 Encounter for antineoplastic radiation therapy: Secondary | ICD-10-CM | POA: Diagnosis not present

## 2023-06-10 DIAGNOSIS — F17210 Nicotine dependence, cigarettes, uncomplicated: Secondary | ICD-10-CM | POA: Diagnosis not present

## 2023-06-10 DIAGNOSIS — C7971 Secondary malignant neoplasm of right adrenal gland: Secondary | ICD-10-CM | POA: Diagnosis not present

## 2023-06-10 DIAGNOSIS — C3432 Malignant neoplasm of lower lobe, left bronchus or lung: Secondary | ICD-10-CM | POA: Diagnosis not present

## 2023-06-10 DIAGNOSIS — C7951 Secondary malignant neoplasm of bone: Secondary | ICD-10-CM | POA: Diagnosis not present

## 2023-06-10 DIAGNOSIS — C7972 Secondary malignant neoplasm of left adrenal gland: Secondary | ICD-10-CM | POA: Diagnosis not present

## 2023-06-10 LAB — RAD ONC ARIA SESSION SUMMARY
Course Elapsed Days: 1
Plan Fractions Treated to Date: 2
Plan Fractions Treated to Date: 2
Plan Prescribed Dose Per Fraction: 5 Gy
Plan Prescribed Dose Per Fraction: 5 Gy
Plan Total Fractions Prescribed: 10
Plan Total Fractions Prescribed: 10
Plan Total Prescribed Dose: 50 Gy
Plan Total Prescribed Dose: 50 Gy
Reference Point Dosage Given to Date: 10 Gy
Reference Point Dosage Given to Date: 10 Gy
Reference Point Session Dosage Given: 5 Gy
Reference Point Session Dosage Given: 5 Gy
Session Number: 2

## 2023-06-11 ENCOUNTER — Ambulatory Visit
Admission: RE | Admit: 2023-06-11 | Discharge: 2023-06-11 | Disposition: A | Discharge status: Home / Self Care | Payer: Medicare HMO | Source: Ambulatory Visit | Attending: Radiation Oncology

## 2023-06-11 ENCOUNTER — Inpatient Hospital Stay: Payer: Medicare HMO

## 2023-06-11 ENCOUNTER — Other Ambulatory Visit: Payer: Self-pay

## 2023-06-11 DIAGNOSIS — C7971 Secondary malignant neoplasm of right adrenal gland: Secondary | ICD-10-CM | POA: Diagnosis not present

## 2023-06-11 DIAGNOSIS — C3432 Malignant neoplasm of lower lobe, left bronchus or lung: Secondary | ICD-10-CM | POA: Diagnosis not present

## 2023-06-11 DIAGNOSIS — C7951 Secondary malignant neoplasm of bone: Secondary | ICD-10-CM | POA: Diagnosis not present

## 2023-06-11 DIAGNOSIS — Z510 Encounter for antineoplastic radiation therapy: Secondary | ICD-10-CM | POA: Diagnosis not present

## 2023-06-11 DIAGNOSIS — F17210 Nicotine dependence, cigarettes, uncomplicated: Secondary | ICD-10-CM | POA: Diagnosis not present

## 2023-06-11 DIAGNOSIS — C7972 Secondary malignant neoplasm of left adrenal gland: Secondary | ICD-10-CM | POA: Diagnosis not present

## 2023-06-11 LAB — RAD ONC ARIA SESSION SUMMARY
Course Elapsed Days: 2
Plan Fractions Treated to Date: 3
Plan Fractions Treated to Date: 3
Plan Prescribed Dose Per Fraction: 5 Gy
Plan Prescribed Dose Per Fraction: 5 Gy
Plan Total Fractions Prescribed: 10
Plan Total Fractions Prescribed: 10
Plan Total Prescribed Dose: 50 Gy
Plan Total Prescribed Dose: 50 Gy
Reference Point Dosage Given to Date: 15 Gy
Reference Point Dosage Given to Date: 15 Gy
Reference Point Session Dosage Given: 5 Gy
Reference Point Session Dosage Given: 5 Gy
Session Number: 3

## 2023-06-14 ENCOUNTER — Other Ambulatory Visit: Payer: Self-pay

## 2023-06-14 ENCOUNTER — Inpatient Hospital Stay: Payer: Medicare HMO

## 2023-06-14 ENCOUNTER — Ambulatory Visit
Admission: RE | Admit: 2023-06-14 | Discharge: 2023-06-14 | Disposition: A | Discharge status: Home / Self Care | Payer: Medicare HMO | Source: Ambulatory Visit | Attending: Radiation Oncology | Admitting: Radiation Oncology

## 2023-06-14 DIAGNOSIS — C7971 Secondary malignant neoplasm of right adrenal gland: Secondary | ICD-10-CM | POA: Diagnosis not present

## 2023-06-14 DIAGNOSIS — C7951 Secondary malignant neoplasm of bone: Secondary | ICD-10-CM | POA: Diagnosis not present

## 2023-06-14 DIAGNOSIS — C3432 Malignant neoplasm of lower lobe, left bronchus or lung: Secondary | ICD-10-CM | POA: Diagnosis not present

## 2023-06-14 DIAGNOSIS — F17210 Nicotine dependence, cigarettes, uncomplicated: Secondary | ICD-10-CM | POA: Diagnosis not present

## 2023-06-14 DIAGNOSIS — C7972 Secondary malignant neoplasm of left adrenal gland: Secondary | ICD-10-CM | POA: Diagnosis not present

## 2023-06-14 DIAGNOSIS — Z510 Encounter for antineoplastic radiation therapy: Secondary | ICD-10-CM | POA: Diagnosis not present

## 2023-06-14 LAB — RAD ONC ARIA SESSION SUMMARY
Course Elapsed Days: 5
Plan Fractions Treated to Date: 4
Plan Fractions Treated to Date: 4
Plan Prescribed Dose Per Fraction: 5 Gy
Plan Prescribed Dose Per Fraction: 5 Gy
Plan Total Fractions Prescribed: 10
Plan Total Fractions Prescribed: 10
Plan Total Prescribed Dose: 50 Gy
Plan Total Prescribed Dose: 50 Gy
Reference Point Dosage Given to Date: 20 Gy
Reference Point Dosage Given to Date: 20 Gy
Reference Point Session Dosage Given: 5 Gy
Reference Point Session Dosage Given: 5 Gy
Session Number: 4

## 2023-06-15 ENCOUNTER — Inpatient Hospital Stay: Payer: Medicare HMO

## 2023-06-15 ENCOUNTER — Other Ambulatory Visit: Payer: Self-pay

## 2023-06-15 ENCOUNTER — Ambulatory Visit
Admission: RE | Admit: 2023-06-15 | Discharge: 2023-06-15 | Disposition: A | Discharge status: Home / Self Care | Payer: Medicare HMO | Source: Ambulatory Visit | Attending: Radiation Oncology | Admitting: Radiation Oncology

## 2023-06-15 DIAGNOSIS — C7972 Secondary malignant neoplasm of left adrenal gland: Secondary | ICD-10-CM | POA: Diagnosis not present

## 2023-06-15 DIAGNOSIS — Z510 Encounter for antineoplastic radiation therapy: Secondary | ICD-10-CM | POA: Diagnosis not present

## 2023-06-15 DIAGNOSIS — F17210 Nicotine dependence, cigarettes, uncomplicated: Secondary | ICD-10-CM | POA: Diagnosis not present

## 2023-06-15 DIAGNOSIS — C7951 Secondary malignant neoplasm of bone: Secondary | ICD-10-CM | POA: Diagnosis not present

## 2023-06-15 DIAGNOSIS — C7971 Secondary malignant neoplasm of right adrenal gland: Secondary | ICD-10-CM | POA: Diagnosis not present

## 2023-06-15 DIAGNOSIS — C3432 Malignant neoplasm of lower lobe, left bronchus or lung: Secondary | ICD-10-CM | POA: Diagnosis not present

## 2023-06-15 LAB — RAD ONC ARIA SESSION SUMMARY
Course Elapsed Days: 6
Plan Fractions Treated to Date: 5
Plan Fractions Treated to Date: 5
Plan Prescribed Dose Per Fraction: 5 Gy
Plan Prescribed Dose Per Fraction: 5 Gy
Plan Total Fractions Prescribed: 10
Plan Total Fractions Prescribed: 10
Plan Total Prescribed Dose: 50 Gy
Plan Total Prescribed Dose: 50 Gy
Reference Point Dosage Given to Date: 25 Gy
Reference Point Dosage Given to Date: 25 Gy
Reference Point Session Dosage Given: 5 Gy
Reference Point Session Dosage Given: 5 Gy
Session Number: 5

## 2023-06-16 ENCOUNTER — Ambulatory Visit: Payer: Medicare HMO

## 2023-06-16 ENCOUNTER — Inpatient Hospital Stay: Payer: Medicare HMO

## 2023-06-16 ENCOUNTER — Telehealth: Payer: Self-pay | Admitting: Internal Medicine

## 2023-06-16 NOTE — Telephone Encounter (Signed)
Called patient regarding upcoming November/December appointments, patient is notified.

## 2023-06-17 ENCOUNTER — Inpatient Hospital Stay: Payer: Medicare HMO

## 2023-06-17 ENCOUNTER — Ambulatory Visit: Payer: Medicare HMO

## 2023-06-18 ENCOUNTER — Ambulatory Visit
Admission: RE | Admit: 2023-06-18 | Discharge: 2023-06-18 | Disposition: A | Discharge status: Home / Self Care | Payer: Medicare HMO | Source: Ambulatory Visit | Attending: Radiation Oncology

## 2023-06-18 ENCOUNTER — Inpatient Hospital Stay: Payer: Medicare HMO

## 2023-06-18 ENCOUNTER — Other Ambulatory Visit: Payer: Self-pay

## 2023-06-18 ENCOUNTER — Ambulatory Visit
Admission: RE | Admit: 2023-06-18 | Discharge: 2023-06-18 | Disposition: A | Discharge status: Home / Self Care | Payer: Medicare HMO | Source: Ambulatory Visit | Attending: Radiation Oncology | Admitting: Radiation Oncology

## 2023-06-18 DIAGNOSIS — F17210 Nicotine dependence, cigarettes, uncomplicated: Secondary | ICD-10-CM | POA: Diagnosis not present

## 2023-06-18 DIAGNOSIS — C3432 Malignant neoplasm of lower lobe, left bronchus or lung: Secondary | ICD-10-CM | POA: Diagnosis not present

## 2023-06-18 DIAGNOSIS — C7972 Secondary malignant neoplasm of left adrenal gland: Secondary | ICD-10-CM | POA: Insufficient documentation

## 2023-06-18 DIAGNOSIS — C7951 Secondary malignant neoplasm of bone: Secondary | ICD-10-CM | POA: Diagnosis not present

## 2023-06-18 DIAGNOSIS — C7971 Secondary malignant neoplasm of right adrenal gland: Secondary | ICD-10-CM | POA: Diagnosis not present

## 2023-06-18 DIAGNOSIS — Z510 Encounter for antineoplastic radiation therapy: Secondary | ICD-10-CM | POA: Insufficient documentation

## 2023-06-18 LAB — RAD ONC ARIA SESSION SUMMARY
Course Elapsed Days: 9
Plan Fractions Treated to Date: 6
Plan Fractions Treated to Date: 6
Plan Prescribed Dose Per Fraction: 5 Gy
Plan Prescribed Dose Per Fraction: 5 Gy
Plan Total Fractions Prescribed: 10
Plan Total Fractions Prescribed: 10
Plan Total Prescribed Dose: 50 Gy
Plan Total Prescribed Dose: 50 Gy
Reference Point Dosage Given to Date: 30 Gy
Reference Point Dosage Given to Date: 30 Gy
Reference Point Session Dosage Given: 5 Gy
Reference Point Session Dosage Given: 5 Gy
Session Number: 6

## 2023-06-21 ENCOUNTER — Ambulatory Visit: Payer: Medicare HMO

## 2023-06-21 ENCOUNTER — Inpatient Hospital Stay: Payer: Medicare HMO

## 2023-06-22 ENCOUNTER — Ambulatory Visit
Admission: RE | Admit: 2023-06-22 | Discharge: 2023-06-22 | Disposition: A | Discharge status: Home / Self Care | Payer: Medicare HMO | Source: Ambulatory Visit | Attending: Radiation Oncology | Admitting: Radiation Oncology

## 2023-06-22 ENCOUNTER — Inpatient Hospital Stay: Payer: Medicare HMO

## 2023-06-22 ENCOUNTER — Other Ambulatory Visit: Payer: Self-pay

## 2023-06-22 DIAGNOSIS — C7951 Secondary malignant neoplasm of bone: Secondary | ICD-10-CM | POA: Diagnosis not present

## 2023-06-22 DIAGNOSIS — C7971 Secondary malignant neoplasm of right adrenal gland: Secondary | ICD-10-CM | POA: Diagnosis not present

## 2023-06-22 DIAGNOSIS — F17210 Nicotine dependence, cigarettes, uncomplicated: Secondary | ICD-10-CM | POA: Diagnosis not present

## 2023-06-22 DIAGNOSIS — C3432 Malignant neoplasm of lower lobe, left bronchus or lung: Secondary | ICD-10-CM | POA: Diagnosis not present

## 2023-06-22 DIAGNOSIS — C7972 Secondary malignant neoplasm of left adrenal gland: Secondary | ICD-10-CM | POA: Diagnosis not present

## 2023-06-22 DIAGNOSIS — Z510 Encounter for antineoplastic radiation therapy: Secondary | ICD-10-CM | POA: Diagnosis not present

## 2023-06-22 LAB — RAD ONC ARIA SESSION SUMMARY
Course Elapsed Days: 13
Plan Fractions Treated to Date: 7
Plan Fractions Treated to Date: 7
Plan Prescribed Dose Per Fraction: 5 Gy
Plan Prescribed Dose Per Fraction: 5 Gy
Plan Total Fractions Prescribed: 10
Plan Total Fractions Prescribed: 10
Plan Total Prescribed Dose: 50 Gy
Plan Total Prescribed Dose: 50 Gy
Reference Point Dosage Given to Date: 35 Gy
Reference Point Dosage Given to Date: 35 Gy
Reference Point Session Dosage Given: 5 Gy
Reference Point Session Dosage Given: 5 Gy
Session Number: 7

## 2023-06-23 ENCOUNTER — Other Ambulatory Visit: Payer: Self-pay

## 2023-06-23 ENCOUNTER — Ambulatory Visit
Admission: RE | Admit: 2023-06-23 | Discharge: 2023-06-23 | Disposition: A | Discharge status: Home / Self Care | Payer: Medicare HMO | Source: Ambulatory Visit | Attending: Radiation Oncology | Admitting: Radiation Oncology

## 2023-06-23 ENCOUNTER — Inpatient Hospital Stay: Payer: Medicare HMO

## 2023-06-23 DIAGNOSIS — C7951 Secondary malignant neoplasm of bone: Secondary | ICD-10-CM | POA: Diagnosis not present

## 2023-06-23 DIAGNOSIS — F17210 Nicotine dependence, cigarettes, uncomplicated: Secondary | ICD-10-CM | POA: Diagnosis not present

## 2023-06-23 DIAGNOSIS — C7972 Secondary malignant neoplasm of left adrenal gland: Secondary | ICD-10-CM | POA: Diagnosis not present

## 2023-06-23 DIAGNOSIS — C7971 Secondary malignant neoplasm of right adrenal gland: Secondary | ICD-10-CM | POA: Diagnosis not present

## 2023-06-23 DIAGNOSIS — C3432 Malignant neoplasm of lower lobe, left bronchus or lung: Secondary | ICD-10-CM | POA: Diagnosis not present

## 2023-06-23 DIAGNOSIS — Z510 Encounter for antineoplastic radiation therapy: Secondary | ICD-10-CM | POA: Diagnosis not present

## 2023-06-23 LAB — RAD ONC ARIA SESSION SUMMARY
Course Elapsed Days: 14
Plan Fractions Treated to Date: 8
Plan Fractions Treated to Date: 8
Plan Prescribed Dose Per Fraction: 5 Gy
Plan Prescribed Dose Per Fraction: 5 Gy
Plan Total Fractions Prescribed: 10
Plan Total Fractions Prescribed: 10
Plan Total Prescribed Dose: 50 Gy
Plan Total Prescribed Dose: 50 Gy
Reference Point Dosage Given to Date: 40 Gy
Reference Point Dosage Given to Date: 40 Gy
Reference Point Session Dosage Given: 5 Gy
Reference Point Session Dosage Given: 5 Gy
Session Number: 8

## 2023-06-24 ENCOUNTER — Inpatient Hospital Stay: Payer: Medicare HMO

## 2023-06-24 ENCOUNTER — Ambulatory Visit
Admission: RE | Admit: 2023-06-24 | Discharge: 2023-06-24 | Disposition: A | Discharge status: Home / Self Care | Payer: Medicare HMO | Source: Ambulatory Visit | Attending: Radiation Oncology | Admitting: Radiation Oncology

## 2023-06-24 ENCOUNTER — Other Ambulatory Visit: Payer: Self-pay

## 2023-06-24 DIAGNOSIS — C7971 Secondary malignant neoplasm of right adrenal gland: Secondary | ICD-10-CM | POA: Diagnosis not present

## 2023-06-24 DIAGNOSIS — C3432 Malignant neoplasm of lower lobe, left bronchus or lung: Secondary | ICD-10-CM | POA: Diagnosis not present

## 2023-06-24 DIAGNOSIS — C7951 Secondary malignant neoplasm of bone: Secondary | ICD-10-CM | POA: Diagnosis not present

## 2023-06-24 DIAGNOSIS — F17210 Nicotine dependence, cigarettes, uncomplicated: Secondary | ICD-10-CM | POA: Diagnosis not present

## 2023-06-24 DIAGNOSIS — C7972 Secondary malignant neoplasm of left adrenal gland: Secondary | ICD-10-CM | POA: Diagnosis not present

## 2023-06-24 DIAGNOSIS — Z510 Encounter for antineoplastic radiation therapy: Secondary | ICD-10-CM | POA: Diagnosis not present

## 2023-06-24 LAB — RAD ONC ARIA SESSION SUMMARY
Course Elapsed Days: 15
Plan Fractions Treated to Date: 9
Plan Fractions Treated to Date: 9
Plan Prescribed Dose Per Fraction: 5 Gy
Plan Prescribed Dose Per Fraction: 5 Gy
Plan Total Fractions Prescribed: 10
Plan Total Fractions Prescribed: 10
Plan Total Prescribed Dose: 50 Gy
Plan Total Prescribed Dose: 50 Gy
Reference Point Dosage Given to Date: 45 Gy
Reference Point Dosage Given to Date: 45 Gy
Reference Point Session Dosage Given: 5 Gy
Reference Point Session Dosage Given: 5 Gy
Session Number: 9

## 2023-06-25 ENCOUNTER — Ambulatory Visit
Admission: RE | Admit: 2023-06-25 | Discharge: 2023-06-25 | Disposition: A | Discharge status: Home / Self Care | Payer: Medicare HMO | Source: Ambulatory Visit | Attending: Radiation Oncology | Admitting: Radiation Oncology

## 2023-06-25 ENCOUNTER — Other Ambulatory Visit: Payer: Self-pay

## 2023-06-25 ENCOUNTER — Ambulatory Visit: Admission: RE | Admit: 2023-06-25 | Payer: Medicare HMO | Source: Ambulatory Visit

## 2023-06-25 DIAGNOSIS — C7931 Secondary malignant neoplasm of brain: Secondary | ICD-10-CM | POA: Insufficient documentation

## 2023-06-25 DIAGNOSIS — Z510 Encounter for antineoplastic radiation therapy: Secondary | ICD-10-CM | POA: Diagnosis not present

## 2023-06-25 DIAGNOSIS — F17210 Nicotine dependence, cigarettes, uncomplicated: Secondary | ICD-10-CM | POA: Diagnosis not present

## 2023-06-25 DIAGNOSIS — C3432 Malignant neoplasm of lower lobe, left bronchus or lung: Secondary | ICD-10-CM | POA: Diagnosis not present

## 2023-06-25 DIAGNOSIS — C7972 Secondary malignant neoplasm of left adrenal gland: Secondary | ICD-10-CM | POA: Diagnosis not present

## 2023-06-25 DIAGNOSIS — C7971 Secondary malignant neoplasm of right adrenal gland: Secondary | ICD-10-CM | POA: Diagnosis not present

## 2023-06-25 LAB — RAD ONC ARIA SESSION SUMMARY
Course Elapsed Days: 16
Plan Fractions Treated to Date: 10
Plan Fractions Treated to Date: 10
Plan Prescribed Dose Per Fraction: 5 Gy
Plan Prescribed Dose Per Fraction: 5 Gy
Plan Total Fractions Prescribed: 10
Plan Total Fractions Prescribed: 10
Plan Total Prescribed Dose: 50 Gy
Plan Total Prescribed Dose: 50 Gy
Reference Point Dosage Given to Date: 50 Gy
Reference Point Dosage Given to Date: 50 Gy
Reference Point Session Dosage Given: 5 Gy
Reference Point Session Dosage Given: 5 Gy
Session Number: 10

## 2023-06-28 NOTE — Radiation Completion Notes (Signed)
  Radiation Oncology         [phone] ________________________________  Name: Brett Campbell MRN: [id number]  Date of Service: 06/25/2023  DOB: 23-Dec-1960  End of Treatment Note     Diagnosis: Extensive Stage Small Cell Carcinoma of the LLL with nodal, adrenal, and brain metastases   Intent: Curative     ==========DELIVERED PLANS==========  First Treatment Date: 2023-06-09 - Last Treatment Date: 2023-06-25   Plan Name: Adrenl_R_UHRT Site: Adrenal Gland, Right Technique: IMRT Mode: Photon Dose Per Fraction: 5 Gy Prescribed Dose (Delivered / Prescribed): 50 Gy / 50 Gy Prescribed Fxs (Delivered / Prescribed): 10 / 10   Plan Name: Adrenl_L_UHRT Site: Adrenal Gland, Left Technique: IMRT Mode: Photon Dose Per Fraction: 5 Gy Prescribed Dose (Delivered / Prescribed): 50 Gy / 50 Gy Prescribed Fxs (Delivered / Prescribed): 10 / 10     ==========ON TREATMENT VISIT DATES========== 2023-06-11, 2023-06-18   See weekly On Treatment Notes in Epic for details. The patient tolerated radiation. He developed fatigue during treatment.  The patient will receive a call in about one month from the radiation oncology department. He will have a repeat MRI on 07/06/23 and will be contacted to review these results. He will also continue follow up with Dr. Arbutus Ped as well.      Osker Mason, PAC

## 2023-07-02 ENCOUNTER — Other Ambulatory Visit: Payer: Self-pay

## 2023-07-02 ENCOUNTER — Inpatient Hospital Stay: Payer: Medicare HMO | Attending: Physician Assistant

## 2023-07-02 ENCOUNTER — Ambulatory Visit (HOSPITAL_COMMUNITY): Payer: Medicare HMO

## 2023-07-02 DIAGNOSIS — D696 Thrombocytopenia, unspecified: Secondary | ICD-10-CM | POA: Insufficient documentation

## 2023-07-02 DIAGNOSIS — Z79899 Other long term (current) drug therapy: Secondary | ICD-10-CM | POA: Insufficient documentation

## 2023-07-02 DIAGNOSIS — C7931 Secondary malignant neoplasm of brain: Secondary | ICD-10-CM | POA: Insufficient documentation

## 2023-07-02 DIAGNOSIS — C3432 Malignant neoplasm of lower lobe, left bronchus or lung: Secondary | ICD-10-CM | POA: Insufficient documentation

## 2023-07-02 DIAGNOSIS — K8689 Other specified diseases of pancreas: Secondary | ICD-10-CM | POA: Insufficient documentation

## 2023-07-02 DIAGNOSIS — Z5112 Encounter for antineoplastic immunotherapy: Secondary | ICD-10-CM | POA: Insufficient documentation

## 2023-07-02 DIAGNOSIS — Z87891 Personal history of nicotine dependence: Secondary | ICD-10-CM | POA: Insufficient documentation

## 2023-07-02 DIAGNOSIS — E039 Hypothyroidism, unspecified: Secondary | ICD-10-CM | POA: Insufficient documentation

## 2023-07-02 DIAGNOSIS — C7971 Secondary malignant neoplasm of right adrenal gland: Secondary | ICD-10-CM | POA: Insufficient documentation

## 2023-07-02 DIAGNOSIS — C7972 Secondary malignant neoplasm of left adrenal gland: Secondary | ICD-10-CM | POA: Insufficient documentation

## 2023-07-05 ENCOUNTER — Inpatient Hospital Stay: Payer: Medicare HMO | Admitting: Internal Medicine

## 2023-07-05 ENCOUNTER — Encounter: Payer: Self-pay | Admitting: Internal Medicine

## 2023-07-05 ENCOUNTER — Inpatient Hospital Stay: Payer: Medicare HMO

## 2023-07-05 VITALS — BP 123/76 | HR 77 | Temp 98.4°F | Resp 17 | Ht 70.0 in | Wt 149.3 lb

## 2023-07-05 VITALS — BP 130/64 | HR 66 | Temp 98.7°F | Resp 18

## 2023-07-05 DIAGNOSIS — Z87891 Personal history of nicotine dependence: Secondary | ICD-10-CM | POA: Diagnosis not present

## 2023-07-05 DIAGNOSIS — C3490 Malignant neoplasm of unspecified part of unspecified bronchus or lung: Secondary | ICD-10-CM | POA: Diagnosis not present

## 2023-07-05 DIAGNOSIS — C7931 Secondary malignant neoplasm of brain: Secondary | ICD-10-CM | POA: Diagnosis not present

## 2023-07-05 DIAGNOSIS — C3432 Malignant neoplasm of lower lobe, left bronchus or lung: Secondary | ICD-10-CM | POA: Diagnosis not present

## 2023-07-05 DIAGNOSIS — Z5112 Encounter for antineoplastic immunotherapy: Secondary | ICD-10-CM | POA: Diagnosis not present

## 2023-07-05 DIAGNOSIS — Z79899 Other long term (current) drug therapy: Secondary | ICD-10-CM | POA: Diagnosis not present

## 2023-07-05 DIAGNOSIS — E039 Hypothyroidism, unspecified: Secondary | ICD-10-CM | POA: Diagnosis not present

## 2023-07-05 DIAGNOSIS — K8689 Other specified diseases of pancreas: Secondary | ICD-10-CM | POA: Diagnosis not present

## 2023-07-05 DIAGNOSIS — C7972 Secondary malignant neoplasm of left adrenal gland: Secondary | ICD-10-CM | POA: Diagnosis not present

## 2023-07-05 DIAGNOSIS — D696 Thrombocytopenia, unspecified: Secondary | ICD-10-CM | POA: Diagnosis not present

## 2023-07-05 DIAGNOSIS — Z95828 Presence of other vascular implants and grafts: Secondary | ICD-10-CM

## 2023-07-05 DIAGNOSIS — C7971 Secondary malignant neoplasm of right adrenal gland: Secondary | ICD-10-CM | POA: Diagnosis not present

## 2023-07-05 LAB — CMP (CANCER CENTER ONLY)
ALT: 21 U/L (ref 0–44)
AST: 22 U/L (ref 15–41)
Albumin: 4 g/dL (ref 3.5–5.0)
Alkaline Phosphatase: 68 U/L (ref 38–126)
Anion gap: 9 (ref 5–15)
BUN: 12 mg/dL (ref 8–23)
CO2: 26 mmol/L (ref 22–32)
Calcium: 7.1 mg/dL — ABNORMAL LOW (ref 8.9–10.3)
Chloride: 104 mmol/L (ref 98–111)
Creatinine: 1.14 mg/dL (ref 0.61–1.24)
GFR, Estimated: >60 mL/min (ref >60)
Glucose, Bld: 167 mg/dL — ABNORMAL HIGH (ref 70–99)
Potassium: 4 mmol/L (ref 3.5–5.1)
Sodium: 139 mmol/L (ref 135–145)
Total Bilirubin: 0.4 mg/dL (ref <1.2)
Total Protein: 6.7 g/dL (ref 6.5–8.1)

## 2023-07-05 LAB — CBC WITH DIFFERENTIAL (CANCER CENTER ONLY)
Abs Immature Granulocytes: 0.03 10*3/uL (ref 0.00–0.07)
Basophils Absolute: 0.1 10*3/uL (ref 0.0–0.1)
Basophils Relative: 1 %
Eosinophils Absolute: 0.4 10*3/uL (ref 0.0–0.5)
Eosinophils Relative: 5 %
HCT: 40.3 % (ref 39.0–52.0)
Hemoglobin: 14.5 g/dL (ref 13.0–17.0)
Immature Granulocytes: 0 %
Lymphocytes Relative: 4 %
Lymphs Abs: 0.4 10*3/uL — ABNORMAL LOW (ref 0.7–4.0)
MCH: 32.3 pg (ref 26.0–34.0)
MCHC: 36 g/dL (ref 30.0–36.0)
MCV: 89.8 fL (ref 80.0–100.0)
Monocytes Absolute: 0.9 10*3/uL (ref 0.1–1.0)
Monocytes Relative: 11 %
Neutro Abs: 6.9 10*3/uL (ref 1.7–7.7)
Neutrophils Relative %: 79 %
Platelet Count: 126 10*3/uL — ABNORMAL LOW (ref 150–400)
RBC: 4.49 MIL/uL (ref 4.22–5.81)
RDW: 11.9 % (ref 11.5–15.5)
WBC Count: 8.7 10*3/uL (ref 4.0–10.5)
nRBC: 0 % (ref 0.0–0.2)

## 2023-07-05 MED ORDER — SODIUM CHLORIDE 0.9% FLUSH
10.0000 mL | Freq: Once | INTRAVENOUS | Status: AC
  Administered 2023-07-05: 10 mL

## 2023-07-05 MED ORDER — SODIUM CHLORIDE 0.9% FLUSH
10.0000 mL | INTRAVENOUS | Status: DC | PRN
Start: 2023-07-05 — End: 2023-07-05
  Administered 2023-07-05: 10 mL

## 2023-07-05 MED ORDER — SODIUM CHLORIDE 0.9 % IV SOLN: Freq: Once | INTRAVENOUS | Status: AC

## 2023-07-05 MED ORDER — HEPARIN SOD (PORK) LOCK FLUSH 100 UNIT/ML IV SOLN
500.0000 [IU] | Freq: Once | INTRAVENOUS | Status: AC | PRN
  Administered 2023-07-05: 500 [IU]

## 2023-07-05 MED ORDER — SODIUM CHLORIDE 0.9 % IV SOLN
1500.0000 mg | Freq: Once | INTRAVENOUS | Status: AC
  Administered 2023-07-05: 1500 mg via INTRAVENOUS
  Filled 2023-07-05: qty 30

## 2023-07-05 NOTE — Progress Notes (Signed)
[facility] Telephone:([phone]) [phone]   Fax:([phone]) [phone]  OFFICE PROGRESS NOTE  [name], MD 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  PRIOR THERAPY: Whole brain irradiation for brain metastasis.  CURRENT THERAPY: Systemic chemotherapy with carboplatin for AUC of 5 on day 1, etoposide 100 Mg/M2 on days 1, 2 and 3 with Cosela 240 Mg/M2 on the days of the chemotherapy and Imfinzi 1500 Mg IV every 3 weeks.  First dose August 10, 2022.  Status post 12 cycles.  Starting from cycle #5 he will be on maintenance treatment with Imfinzi 1500 Mg IV every 4 weeks.  INTERVAL HISTORY: Brett Campbell 62 y.o. male returns to the clinic today for follow-up visit accompanied by his sister and his sign interpreter.Discussed the use of AI scribe software for clinical note transcription with the patient, who gave verbal consent to proceed.  History of Present Illness   Brett Campbell, a 62 year old individual, was diagnosed with extensive stage small cell lung cancer in December 2023. He was initially treated with a combination of carboplatin, etoposide, and Imfinzi, and has since transitioned to Imfinzi monotherapy from the fifth cycle onwards. He has received a total of twelve cycles to date.  He reports experiencing some nausea, which he attributes to radiation therapy. He has also noticed a decrease in weight, from 180 lbs in December to 149 lbs currently. Despite this, he denies any significant changes in appetite, although he acknowledges some impact due to the nausea from radiation therapy.  He has also undergone radiation therapy to  both adrenal glands. He denies any chest pain or significant breathing issues, although he does report a mild cough. He has not experienced any significant pain.  He has also been dealing with low blood pressure, which was particularly notable during a recent appointment with Dr. Mitzi Hansen, where it was recorded as 97/65. However, his blood pressure was within normal limits at the time of the current consultation.  He has been prescribed levothyroxine for thyroid issues, which have necessitated an appointment with an endocrinologist. He also reports feeling cold occasionally and experiencing increased fatigue, leading to increased sleep. Despite this, his hemoglobin levels remain within normal limits.  He has a history of smoking, which he has quit, although he continues to use cannabis. He is aware of the need to quit this as well.       MEDICAL HISTORY: Past Medical History:  Diagnosis Date   Calcium deficiency    Cataract    COPD (chronic obstructive pulmonary disease) (HCC)    moderate emphysema   Deaf    Hypertension    Hypothyroidism    Personal history of colonic polyps 11/03/2020   Pneumonia    August 2023   Scarlet fever    Sleep apnea    never used a Cpap, used to use O2   Thyroid disease    Tuberculosis    [place] Health department is contact with him due to hx.    ALLERGIES:  is allergic to penicillins.  MEDICATIONS:  Current Outpatient Medications  Medication Sig Dispense Refill   amLODipine (NORVASC) 5 MG tablet Take 1 tablet (5 mg total) by mouth  daily. 90 tablet 3   atorvastatin (LIPITOR) 10 MG tablet Take 1 tablet (10 mg total) by mouth daily. 90 tablet 3   calcitRIOL (ROCALTROL) 0.25 MCG capsule Take 1 capsule (0.25 mcg total) by mouth daily. 30 capsule 3   calcium carbonate (TUMS) 500 MG chewable tablet Chew 1 tablet (200 mg of elemental calcium total) by mouth 3 (three) times daily. 90 tablet 3   colestipol (COLESTID) 1 g tablet Take 1 tablet (1 g total) by  mouth 2 (two) times daily. 60 tablet 3   HYDROcodone bit-homatropine (HYCODAN) 5-1.5 MG/5ML syrup Take 5 mLs by mouth every 6 hours as needed for cough. 240 mL 0   ibuprofen (ADVIL) 200 MG tablet Take 400-800 mg by mouth every 6 (six) hours as needed for moderate pain.     levothyroxine (SYNTHROID) 150 MCG tablet TAKE 1 TABLET EVERY DAY 90 tablet 3   lidocaine-prilocaine (EMLA) cream Apply to affected area once 30 g 3   loperamide (IMODIUM A-D) 2 MG tablet Take 1 tablet (2 mg total) by mouth 4 (four) times daily as needed for diarrhea or loose stools. 30 tablet 0   ondansetron (ZOFRAN) 8 MG tablet Take 1 tablet (8 mg total) by mouth every 8 (eight) hours as needed for nausea, vomiting or refractory nausea / vomiting. Start on the third day after carboplatin. (Patient taking differently: Take 8 mg by mouth every 6 (six) hours as needed for nausea, vomiting or refractory nausea / vomiting. Start on the third day after carboplatin.) 30 tablet 1   potassium chloride SA (KLOR-CON M) 20 MEQ tablet Take 1 tablet (20 mEq total) by mouth daily. 90 tablet 3   prochlorperazine (COMPAZINE) 10 MG tablet Take 1 tablet (10 mg total) by mouth every 6 (six) hours as needed. (Patient taking differently: Take 10 mg by mouth every 6 (six) hours as needed for nausea or vomiting.) 30 tablet 2   triamcinolone cream (KENALOG) 0.1 % Apply 1 Application topically 2 (two) times daily as needed. 453.6 g 0   No current facility-administered medications for this visit.    SURGICAL HISTORY:  Past Surgical History:  Procedure Laterality Date   CHOLECYSTECTOMY     COLONOSCOPY     +5years   IR IMAGING GUIDED PORT INSERTION  08/14/2022   LAPAROSCOPIC RIGHT HEMI COLECTOMY N/A 02/26/2021   Procedure: LAPAROSCOPIC RIGHT HEMI COLECTOMY;  Surgeon: Andria Meuse, MD;  Location: [facility];  Service: General;  Laterality: N/A;   THYROIDECTOMY     VIDEO BRONCHOSCOPY WITH ENDOBRONCHIAL ULTRASOUND N/A 07/24/2022   Procedure: VIDEO  BRONCHOSCOPY WITH ENDOBRONCHIAL ULTRASOUND  WITH LEFT LOWER LOBE  MASS BIOPSIES;  Surgeon: Martina Sinner, MD;  Location: [facility];  Service: Pulmonary;  Laterality: N/A;    REVIEW OF SYSTEMS:  A comprehensive review of systems was negative except for: Constitutional: positive for fatigue and weight loss Respiratory: positive for cough Gastrointestinal: positive for nausea   PHYSICAL EXAMINATION: General appearance: alert, cooperative, and fatigued Head: Normocephalic, without obvious abnormality, atraumatic Neck: no adenopathy, no JVD, supple, symmetrical, trachea midline, and thyroid not enlarged, symmetric, no tenderness/mass/nodules Lymph nodes: Cervical, supraclavicular, and axillary nodes normal. Resp: clear to auscultation bilaterally Back: symmetric, no curvature. ROM normal. No CVA tenderness. Cardio: regular rate and rhythm, S1, S2 normal, no murmur, click, rub or gallop GI: soft, non-tender; bowel sounds normal; no masses,  no organomegaly Extremities: extremities normal, atraumatic, no cyanosis or edema  ECOG PERFORMANCE STATUS: 1 - Symptomatic but completely ambulatory  Blood  pressure 123/76, pulse 77, temperature 98.4 F (36.9 C), temperature source Oral, resp. rate 17, height 5\' 10"  (1.778 m), weight 149 lb 4.8 oz (67.7 kg), SpO2 100%.  LABORATORY DATA: Lab Results  Component Value Date   WBC 8.7 07/05/2023   HGB 14.5 07/05/2023   HCT 40.3 07/05/2023   MCV 89.8 07/05/2023   PLT 126 (L) 07/05/2023      Chemistry      Component Value Date/Time   NA 140 06/07/2023 1048   NA 139 04/28/2022 1038   K 3.9 06/07/2023 1048   CL 105 06/07/2023 1048   CO2 29 06/07/2023 1048   BUN 16 06/07/2023 1048   BUN 14 04/28/2022 1038   CREATININE 1.15 06/07/2023 1048      Component Value Date/Time   CALCIUM 8.2 (L) 06/07/2023 1048   ALKPHOS 63 06/07/2023 1048   AST 17 06/07/2023 1048   ALT 14 06/07/2023 1048   BILITOT 0.4 06/07/2023 1048       RADIOGRAPHIC STUDIES: No  results found.  ASSESSMENT AND PLAN: This is a very pleasant 62 years old white male with extensive stage (T4, N3, M1 C) small cell lung cancer presented with obstructing left lower lobe lung mass with left extrapleural lymph node, bilateral adrenal metastasis, pancreatic lesion as well as upper abdominal lymphadenopathy and soft tissue nodules in the abdominal wall as well as the abdomen diagnosed in December 2023. The patient started the first cycle of his systemic chemotherapy with carboplatin for AUC of 5 on day 1 and etoposide 100 Mg/M2 on days 1, 2 and 3 with Cosela before the chemotherapy and Imfinzi 1500 Mg IV on day 1 every 3 weeks.  Status post 12 cycle given on August 10, 2022.  Starting from cycle #5 he will be on maintenance treatment with Imfinzi every 4 weeks.  The patient has been tolerating this treatment fairly well.    Extensive Stage Small Cell Lung Cancer Diagnosed December 2023. On Imfinzi monotherapy since cycle 5, with 12 cycles completed. Recent radiation to adrenal glands. Mild nausea attributed to radiation. Weight loss from 180 lbs to 149 lbs. No chest pain or significant dyspnea. Blood pressure today 123/76, previously 97/65. Low platelets but acceptable for treatment. Discussed potential chemotherapy if progression noted on scan. Informed consent obtained for continuation of Imfinzi and potential chemotherapy, including risks of nausea, weight loss, and hypotension. - Proceed with Imfinzi treatment - Order CT scan of chest, abdomen, and pelvis - Review scan results and adjust treatment plan if necessary - Consider resuming chemotherapy if scan shows progression - Follow up in four weeks  Hypothyroidism Managed with levothyroxine. Issues with pharmacy sending refill requests to wrong doctor. Endocrinologist appointment on November 20th. - Ensure levothyroxine prescription is correctly managed - Attend endocrinologist appointment on November 20th  General Health  Maintenance Encouraged to quit smoking marijuana. Blood pressure and hemoglobin levels are stable. No leg edema. Advised to stay active despite fatigue. - Encourage cessation of marijuana use - Maintain physical activity  Follow-up - Review scan results within two weeks - Adjust treatment plan as necessary before December 6th infusion.   He was advised to call immediately if he has any concerning symptoms in the interval. The patient voices understanding of current disease status and treatment options and is in agreement with the current care plan.  All questions were answered. The patient knows to call the clinic with any problems, questions or concerns. We can certainly see the patient much sooner if necessary.  The total time spent in the appointment was 25 minutes.  Disclaimer: This note was dictated with voice recognition software. Similar sounding words can inadvertently be transcribed and may not be corrected upon review.

## 2023-07-05 NOTE — Patient Instructions (Signed)
[facility] - A DEPT OF [facility]  Discharge Instructions: Thank you for choosing [facility] to provide your oncology and hematology care.   If you have a lab appointment with the Cancer Center, please go directly to the Cancer Center and check in at the registration area.   Wear comfortable clothing and clothing appropriate for easy access to any Portacath or PICC line.   We strive to give you quality time with your provider. You may need to reschedule your appointment if you arrive late (15 or more minutes).  Arriving late affects you and other patients whose appointments are after yours.  Also, if you miss three or more appointments without notifying the office, you may be dismissed from the clinic at the provider's discretion.      For prescription refill requests, have your pharmacy contact our office and allow 72 hours for refills to be completed.    Today you received the following chemotherapy and/or immunotherapy agent: Durvalumab (Imfinzi)     To help prevent nausea and vomiting after your treatment, we encourage you to take your nausea medication as directed.  BELOW ARE SYMPTOMS THAT SHOULD BE REPORTED IMMEDIATELY: *FEVER GREATER THAN 100.4 F (38 C) OR HIGHER *CHILLS OR SWEATING *NAUSEA AND VOMITING THAT IS NOT CONTROLLED WITH YOUR NAUSEA MEDICATION *UNUSUAL SHORTNESS OF BREATH *UNUSUAL BRUISING OR BLEEDING *URINARY PROBLEMS (pain or burning when urinating, or frequent urination) *BOWEL PROBLEMS (unusual diarrhea, constipation, pain near the anus) TENDERNESS IN MOUTH AND THROAT WITH OR WITHOUT PRESENCE OF ULCERS (sore throat, sores in mouth, or a toothache) UNUSUAL RASH, SWELLING OR PAIN  UNUSUAL VAGINAL DISCHARGE OR ITCHING   Items with * indicate a potential emergency and should be followed up as soon as possible or go to the Emergency Department if any problems should occur.  Please show the CHEMOTHERAPY ALERT CARD or  IMMUNOTHERAPY ALERT CARD at check-in to the Emergency Department and triage nurse.  Should you have questions after your visit or need to cancel or reschedule your appointment, please contact [facility] - A DEPT OF [facility]  Dept: [phone]  and follow the prompts.  Office hours are 8:00 a.m. to 4:30 p.m. Monday - Friday. Please note that voicemails left after 4:00 p.m. may not be returned until the following business day.  We are closed weekends and major holidays. You have access to a nurse at all times for urgent questions. Please call the main number to the clinic Dept: [phone] and follow the prompts.   For any non-urgent questions, you may also contact your provider using MyChart. We now offer e-Visits for anyone 60 and older to request care online for non-urgent symptoms. For details visit [email or web address].   Also download the MyChart app! Go to the app store, search "MyChart", open the app, select [facility], and log in with your MyChart username and password.  Durvalumab Injection What is this medication? DURVALUMAB (dur VAL ue mab) treats some types of cancer. It works by helping your immune system slow or stop the spread of cancer cells. It is a monoclonal antibody. This medicine may be used for other purposes; ask your health care provider or pharmacist if you have questions. COMMON BRAND NAME(S): IMFINZI What should I tell my care team before I take this medication? They need to know if you have any of these conditions: Allogeneic stem cell transplant (uses someone else's stem cells) Autoimmune diseases, such as Crohn  disease, ulcerative colitis, lupus History of chest radiation Nervous system problems, such as Guillain-Barre syndrome, myasthenia gravis Organ transplant An unusual or allergic reaction to durvalumab, other medications, foods, dyes, or preservatives Pregnant or trying to get pregnant Breast-feeding How should I  use this medication? This medication is infused into a vein. It is given by your care team in a hospital or clinic setting. A special MedGuide will be given to you before each treatment. Be sure to read this information carefully each time. Talk to your care team about the use of this medication in children. Special care may be needed. Overdosage: If you think you have taken too much of this medicine contact a poison control center or emergency room at once. NOTE: This medicine is only for you. Do not share this medicine with others. What if I miss a dose? Keep appointments for follow-up doses. It is important not to miss your dose. Call your care team if you are unable to keep an appointment. What may interact with this medication? Interactions have not been studied. This list may not describe all possible interactions. Give your health care provider a list of all the medicines, herbs, non-prescription drugs, or dietary supplements you use. Also tell them if you smoke, drink alcohol, or use illegal drugs. Some items may interact with your medicine. What should I watch for while using this medication? Your condition will be monitored carefully while you are receiving this medication. You may need blood work while taking this medication. This medication may cause serious skin reactions. They can happen weeks to months after starting the medication. Contact your care team right away if you notice fevers or flu-like symptoms with a rash. The rash may be red or purple and then turn into blisters or peeling of the skin. You may also notice a red rash with swelling of the face, lips, or lymph nodes in your neck or under your arms. Tell your care team right away if you have any change in your eyesight. Talk to your care team if you may be pregnant. Serious birth defects can occur if you take this medication during pregnancy and for 3 months after the last dose. You will need a negative pregnancy test before  starting this medication. Contraception is recommended while taking this medication and for 3 months after the last dose. Your care team can help you find the option that works for you. Do not breastfeed while taking this medication and for 3 months after the last dose. What side effects may I notice from receiving this medication? Side effects that you should report to your care team as soon as possible: Allergic reactions--skin rash, itching, hives, swelling of the face, lips, tongue, or throat Dry cough, shortness of breath or trouble breathing Eye pain, redness, irritation, or discharge with blurry or decreased vision Heart muscle inflammation--unusual weakness or fatigue, shortness of breath, chest pain, fast or irregular heartbeat, dizziness, swelling of the ankles, feet, or hands Hormone gland problems--headache, sensitivity to light, unusual weakness or fatigue, dizziness, fast or irregular heartbeat, increased sensitivity to cold or heat, excessive sweating, constipation, hair loss, increased thirst or amount of urine, tremors or shaking, irritability Infusion reactions--chest pain, shortness of breath or trouble breathing, feeling faint or lightheaded Kidney injury (glomerulonephritis)--decrease in the amount of urine, red or dark brown urine, foamy or bubbly urine, swelling of the ankles, hands, or feet Liver injury--right upper belly pain, loss of appetite, nausea, light-colored stool, dark yellow or brown urine, yellowing skin  or eyes, unusual weakness or fatigue Pain, tingling, or numbness in the hands or feet, muscle weakness, change in vision, confusion or trouble speaking, loss of balance or coordination, trouble walking, seizures Rash, fever, and swollen lymph nodes Redness, blistering, peeling, or loosening of the skin, including inside the mouth Sudden or severe stomach pain, bloody diarrhea, fever, nausea, vomiting Side effects that usually do not require medical attention  (report these to your care team if they continue or are bothersome): Bone, joint, or muscle pain Diarrhea Fatigue Loss of appetite Nausea Skin rash This list may not describe all possible side effects. Call your doctor for medical advice about side effects. You may report side effects to FDA at 1-800-FDA-1088. Where should I keep my medication? This medication is given in a hospital or clinic. It will not be stored at home. NOTE: This sheet is a summary. It may not cover all possible information. If you have questions about this medicine, talk to your doctor, pharmacist, or health care provider.  2024 Elsevier/Gold Standard (2021-12-23 00:00:00)

## 2023-07-06 ENCOUNTER — Ambulatory Visit (HOSPITAL_COMMUNITY)
Admission: RE | Admit: 2023-07-06 | Discharge: 2023-07-06 | Disposition: A | Discharge status: Home / Self Care | Payer: Medicare HMO | Source: Ambulatory Visit | Attending: Radiation Oncology | Admitting: Radiation Oncology

## 2023-07-06 ENCOUNTER — Encounter (HOSPITAL_COMMUNITY): Payer: Self-pay | Admitting: Radiation Oncology

## 2023-07-06 DIAGNOSIS — C7931 Secondary malignant neoplasm of brain: Secondary | ICD-10-CM | POA: Diagnosis not present

## 2023-07-06 DIAGNOSIS — H9190 Unspecified hearing loss, unspecified ear: Secondary | ICD-10-CM | POA: Diagnosis not present

## 2023-07-06 DIAGNOSIS — C801 Malignant (primary) neoplasm, unspecified: Secondary | ICD-10-CM | POA: Diagnosis not present

## 2023-07-06 MED ORDER — HEPARIN SOD (PORK) LOCK FLUSH 100 UNIT/ML IV SOLN
500.0000 [IU] | INTRAVENOUS | Status: AC | PRN
  Administered 2023-07-06: 500 [IU]
  Filled 2023-07-06: qty 5

## 2023-07-06 MED ORDER — GADOBUTROL 1 MMOL/ML IV SOLN
7.0000 mL | Freq: Once | INTRAVENOUS | Status: AC | PRN
  Administered 2023-07-06: 7 mL via INTRAVENOUS

## 2023-07-07 ENCOUNTER — Other Ambulatory Visit: Payer: Self-pay | Admitting: Internal Medicine

## 2023-07-07 ENCOUNTER — Ambulatory Visit (HOSPITAL_COMMUNITY)
Admission: RE | Admit: 2023-07-07 | Discharge: 2023-07-07 | Disposition: A | Discharge status: Home / Self Care | Payer: Medicare HMO | Source: Ambulatory Visit | Attending: Internal Medicine | Admitting: Internal Medicine

## 2023-07-07 DIAGNOSIS — J439 Emphysema, unspecified: Secondary | ICD-10-CM | POA: Diagnosis not present

## 2023-07-07 DIAGNOSIS — R918 Other nonspecific abnormal finding of lung field: Secondary | ICD-10-CM | POA: Diagnosis not present

## 2023-07-07 DIAGNOSIS — N281 Cyst of kidney, acquired: Secondary | ICD-10-CM | POA: Diagnosis not present

## 2023-07-07 DIAGNOSIS — C3490 Malignant neoplasm of unspecified part of unspecified bronchus or lung: Secondary | ICD-10-CM | POA: Insufficient documentation

## 2023-07-07 DIAGNOSIS — C3432 Malignant neoplasm of lower lobe, left bronchus or lung: Secondary | ICD-10-CM

## 2023-07-07 MED ORDER — IOHEXOL 300 MG/ML  SOLN
100.0000 mL | Freq: Once | INTRAMUSCULAR | Status: AC | PRN
  Administered 2023-07-07: 100 mL via INTRAVENOUS

## 2023-07-07 MED ORDER — HEPARIN SOD (PORK) LOCK FLUSH 100 UNIT/ML IV SOLN
500.0000 [IU] | Freq: Once | INTRAVENOUS | Status: AC
  Administered 2023-07-07: 500 [IU] via INTRAVENOUS

## 2023-07-08 ENCOUNTER — Ambulatory Visit
Admission: RE | Admit: 2023-07-08 | Discharge: 2023-07-08 | Disposition: A | Discharge status: Home / Self Care | Payer: Medicare HMO | Source: Ambulatory Visit | Attending: Radiation Oncology | Admitting: Radiation Oncology

## 2023-07-08 DIAGNOSIS — C7931 Secondary malignant neoplasm of brain: Secondary | ICD-10-CM

## 2023-07-08 NOTE — Progress Notes (Signed)
Radiation Oncology         [phone]) [phone] ________________________________  Outpatient Re-consultation   Name: Brett Campbell        MRN: [id number]  Date of Service: 07/08/2023 DOB: 1960-09-01  XB:JYNWG, Mabeline Caras, MD  [name], MD     REFERRING PHYSICIAN: [name], MD   DIAGNOSIS:  Extensive Stage Small Cell Lung Cancer.    HISTORY OF PRESENT ILLNESS: Brett Campbell is a 62 y.o. 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 He was diagnosed on 07/24/22 when a bronchoscopy was performed and cytology of the LLL confirmed small cell carcinoma. He began systemic chemotherapy and immunotherapy on 08/10/22. He was found to have brain disease and went on to receive whole brain radiation which he completed on 10/02/22. After completing chemotherapy in March 2024, he  has continued with  immunotherapy with Dr. Arbutus Ped and his last treatment was given on 07/05/23.  He also continues to be followed with our department with surveillance MRI scans of the brain. His most recent MRI on 07/06/23 showed persistent but stable punctate enhancement at sites of previously treated disease. No new findings were noted, and stable endolymphatic sacs were noted and chronic right  mastoid opacification are noted.  He also recently completed palliative radiation to his bilateral adrenal glands and will be following closely with Dr. Arbutus Ped and his endocrinologist.  PREVIOUS RADIATION THERAPY:   06/09/23-06/25/23: Plan Name: Adrenl_R_UHRT Site: Adrenal Gland, Right Technique: IMRT Mode: Photon Dose Per Fraction: 5 Gy Prescribed Dose (Delivered / Prescribed): 50 Gy / 50 Gy Prescribed Fxs (Delivered / Prescribed): 10 / 10  Plan Name: Adrenl_L_UHRT Site: Adrenal Gland, Left Technique: IMRT Mode: Photon Dose Per Fraction: 5 Gy Prescribed Dose (Delivered / Prescribed): 50 Gy / 50 Gy Prescribed Fxs (Delivered / Prescribed): 10 / 10   09/21/2022 through  10/02/2022 Site Technique Total Dose (Gy) Dose per Fx (Gy) Completed Fx Beam Energies  Brain:  Whole Brain Complex 30/30 3 10/10 6X     PAST MEDICAL HISTORY:  Past Medical History:  Diagnosis Date   Calcium deficiency    Cataract    COPD (chronic obstructive pulmonary disease) (HCC)    moderate emphysema   Deaf    Hypertension    Hypothyroidism    Personal history of colonic polyps 11/03/2020   Pneumonia    August 2023   Scarlet fever    Sleep apnea    never used a Cpap, used to use O2   Thyroid disease    Tuberculosis    [place] department is contact with him due to hx.       PAST SURGICAL HISTORY: Past Surgical History:  Procedure Laterality Date   CHOLECYSTECTOMY     COLONOSCOPY     +5years   IR IMAGING GUIDED PORT INSERTION  08/14/2022   LAPAROSCOPIC RIGHT HEMI COLECTOMY N/A 02/26/2021   Procedure: LAPAROSCOPIC RIGHT HEMI COLECTOMY;  Surgeon: Andria Meuse, MD;  Location: [facility];  Service: General;  Laterality: N/A;   THYROIDECTOMY     VIDEO BRONCHOSCOPY WITH ENDOBRONCHIAL ULTRASOUND N/A 07/24/2022   Procedure: VIDEO BRONCHOSCOPY WITH ENDOBRONCHIAL ULTRASOUND  WITH LEFT LOWER LOBE  MASS BIOPSIES;  Surgeon: Martina Sinner, MD;  Location: [facility];  Service: Pulmonary;  Laterality: N/A;     FAMILY HISTORY:  Family History  Problem Relation Age of Onset   CAD Mother    Diabetes Mellitus II Mother    Diabetes Mother  CAD Father    Heart attack Father    Heart disease Father    Diabetes Sister    Colon cancer Neg Hx    Esophageal cancer Neg Hx    Stomach cancer Neg Hx    Colon polyps Neg Hx      SOCIAL HISTORY:  reports that he has been smoking cigarettes. He has a 11.3 pack-year smoking history. He has never used smokeless tobacco. He reports current alcohol use of about 1.0 - 2.0 standard drink of alcohol per week. He reports current drug use. Drug: Marijuana. The patient is single and lives with his sister Brett Campbell. His sister Brett Campbell lives out of  state and joins [place] during the visit by phone. He is deaf and communicates with ASL, and also reads lips.     ALLERGIES: Penicillins   MEDICATIONS:  Current Outpatient Medications  Medication Sig Dispense Refill   amLODipine (NORVASC) 5 MG tablet Take 1 tablet (5 mg total) by mouth daily. 90 tablet 3   atorvastatin (LIPITOR) 10 MG tablet Take 1 tablet (10 mg total) by mouth daily. 90 tablet 3   calcitRIOL (ROCALTROL) 0.25 MCG capsule Take 1 capsule (0.25 mcg total) by mouth daily. 30 capsule 3   calcium carbonate (TUMS) 500 MG chewable tablet Chew 1 tablet (200 mg of elemental calcium total) by mouth 3 (three) times daily. 90 tablet 3   colestipol (COLESTID) 1 g tablet Take 1 tablet (1 g total) by mouth 2 (two) times daily. 60 tablet 3   HYDROcodone bit-homatropine (HYCODAN) 5-1.5 MG/5ML syrup Take 5 mLs by mouth every 6 hours as needed for cough. 240 mL 0   ibuprofen (ADVIL) 200 MG tablet Take 400-800 mg by mouth every 6 (six) hours as needed for moderate pain.     levothyroxine (SYNTHROID) 150 MCG tablet TAKE 1 TABLET EVERY DAY 90 tablet 3   lidocaine-prilocaine (EMLA) cream Apply to affected area once 30 g 3   loperamide (IMODIUM A-D) 2 MG tablet Take 1 tablet (2 mg total) by mouth 4 (four) times daily as needed for diarrhea or loose stools. 30 tablet 0   ondansetron (ZOFRAN) 8 MG tablet Take 1 tablet (8 mg total) by mouth every 8 (eight) hours as needed for nausea, vomiting or refractory nausea / vomiting. Start on the third day after carboplatin. (Patient taking differently: Take 8 mg by mouth every 6 (six) hours as needed for nausea, vomiting or refractory nausea / vomiting. Start on the third day after carboplatin.) 30 tablet 1   potassium chloride SA (KLOR-CON M) 20 MEQ tablet Take 1 tablet (20 mEq total) by mouth daily. 90 tablet 3   prochlorperazine (COMPAZINE) 10 MG tablet Take 1 tablet (10 mg total) by mouth every 6 (six) hours as needed. (Patient taking differently: Take 10 mg by  mouth every 6 (six) hours as needed for nausea or vomiting.) 30 tablet 2   triamcinolone cream (KENALOG) 0.1 % Apply 1 Application topically 2 (two) times daily as needed. 453.6 g 0   No current facility-administered medications for this encounter.     REVIEW OF SYSTEMS: On review of systems, the patient has been doing well. He acknowledges some persistent nausea since his abdominal radiation which he completed just about two weeks ago. His symptoms are relieved with Zofran and Compazine. He is not complaining of headaches, changes in vision, movement, or noting any feelings of changes in pressure or feeling muffled changes in his speech, though this is limited given his history of  deafness. No other complaints are verbalized.      PHYSICAL EXAM:  Unable to assess due to encounter type.   ECOG = 1  0 - Asymptomatic (Fully active, able to carry on all predisease activities without restriction)  1 - Symptomatic but completely ambulatory (Restricted in physically strenuous activity but ambulatory and able to carry out work of a light or sedentary nature. For example, light housework, office work)  2 - Symptomatic, <50% in bed during the day (Ambulatory and capable of all self care but unable to carry out any work activities. Up and about more than 50% of waking hours)  3 - Symptomatic, >50% in bed, but not bedbound (Capable of only limited self-care, confined to bed or chair 50% or more of waking hours)  4 - Bedbound (Completely disabled. Cannot carry on any self-care. Totally confined to bed or chair)  5 - Death   Santiago Glad MM, Creech RH, Tormey DC, et al. [phone]). "Toxicity and response criteria of the [facility]". Am. Evlyn Clines. Oncol. 5 (6): 649-55    LABORATORY DATA:  Lab Results  Component Value Date   WBC 8.7 07/05/2023   HGB 14.5 07/05/2023   HCT 40.3 07/05/2023   MCV 89.8 07/05/2023   PLT 126 (L) 07/05/2023   Lab Results  Component Value Date   NA 139  07/05/2023   K 4.0 07/05/2023   CL 104 07/05/2023   CO2 26 07/05/2023   Lab Results  Component Value Date   ALT 21 07/05/2023   AST 22 07/05/2023   ALKPHOS 68 07/05/2023   BILITOT 0.4 07/05/2023      RADIOGRAPHY: MR Brain W Wo Contrast  Result Date: 07/07/2023 CLINICAL DATA:  Brain metastases.  Assess treatment response. EXAM: MRI HEAD WITHOUT AND WITH CONTRAST TECHNIQUE: Multiplanar, multiecho pulse sequences of the brain and surrounding structures were obtained without and with intravenous contrast. CONTRAST:  7mL GADAVIST GADOBUTROL 1 MMOL/ML IV SOLN COMPARISON:  04/05/2023 FINDINGS: Brain: Diffusion imaging does not show any acute or subacute infarction or other cause of restricted diffusion. Chronic nuclear calcification in cerebellar hemispheres as seen previously. Two punctate foci of residual enhancement within the pons, 1 in the dorsal right pons in the other in the dorsal to mid left paramedian pons, remain stable since August. Redemonstration of a 5 mm focus of enhancement along the cortical surface of the left posterior frontal lobe is stable. No new or progressive lesion. Widespread T2 and FLAIR signal throughout the brain relates to previous whole brain radiation. No hydrocephalus or extra-axial collection. In this patient with a history of deafness, enlarged endolymphatic sacs are again demonstrated on both sides. Vascular: Major vessels at the base of the brain show flow. Skull and upper cervical spine: Negative Sinuses/Orbits: Few opacified ethmoid air cells.  Orbits negative. Other: Fluid throughout the mastoid air cells and middle ear on the right as seen previously. No nasopharyngeal lesion is evident. IMPRESSION: 1. Stable appearance of the brain since August. Two punctate foci of residual enhancement within the pons and a 5 mm focus of enhancement along the cortical surface of the left posterior frontal lobe. No new or progressive lesion. 2. Widespread T2 and FLAIR signal  throughout the brain relates to previous whole brain radiation. 3. In this patient with a history of deafness, enlarged endolymphatic sacs are again demonstrated on both sides. 4. Fluid throughout the mastoid air cells and middle ear on the right as seen previously. No nasopharyngeal lesion is evident. Electronically Signed  By: Paulina Fusi M.D.   On: 07/07/2023 10:46       IMPRESSION/PLAN: 1. 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 The patient's recent imaging shows stability since his whole brain radiation. We will plan repeat scan in 3 months and if continued stability remains, consider extending the interval between scans. He will follow up as well with Dr. Arbutus Ped. Given his recent adrenal radiation, we appreciate Dr. Roxan Diesel  input regarding his case and he will see her next week. 2.  Mastoid opacification. This continues as does endolymphatic changes consistent with his history of deafness and prior therapy with whole brain radiation. Mastoid effusion will be treated as needed but the patient remains asymptomatic at this time. 3. Nausea secondary to recent abdominal radiation. He will continue antiemetics as needed. If symptoms progress or persist for more than a week I encouraged his sister to reach back out to [place] to discuss further.    This encounter was conducted via telephone.  The patient has provided two factor identification and has given verbal consent for this type of encounter and has been advised to only accept a meeting of this type in a secure network environment. The time spent during this encounter was 35 minutes including preparation, discussion, and coordination of the patient's care. The attendants for this meeting include   Ronny Bacon  and Nat Christen [place] and his sister Brett Campbell. During the encounter,  Ronny Bacon was located remotely at home. Corderro Delose Steve was located at home  with his sister Brett Campbell.    Osker Mason, [facility]   **Disclaimer: This note was dictated with voice recognition software. Similar sounding words can inadvertently be transcribed and this note may contain transcription errors which may not have been corrected upon publication of note.**

## 2023-07-12 ENCOUNTER — Other Ambulatory Visit: Payer: Self-pay | Admitting: Radiation Oncology

## 2023-07-12 ENCOUNTER — Ambulatory Visit: Payer: Medicare HMO | Admitting: Radiation Oncology

## 2023-07-12 DIAGNOSIS — C7931 Secondary malignant neoplasm of brain: Secondary | ICD-10-CM

## 2023-07-14 ENCOUNTER — Telehealth (INDEPENDENT_AMBULATORY_CARE_PROVIDER_SITE_OTHER): Payer: Medicare HMO | Admitting: Internal Medicine

## 2023-07-14 ENCOUNTER — Other Ambulatory Visit: Payer: Self-pay

## 2023-07-14 ENCOUNTER — Telehealth: Payer: Self-pay | Admitting: Internal Medicine

## 2023-07-14 VITALS — Ht 70.0 in

## 2023-07-14 DIAGNOSIS — E2089 Other specified hypoparathyroidism: Secondary | ICD-10-CM

## 2023-07-14 DIAGNOSIS — E890 Postprocedural hypothyroidism: Secondary | ICD-10-CM | POA: Diagnosis not present

## 2023-07-14 DIAGNOSIS — T66XXXS Radiation sickness, unspecified, sequela: Secondary | ICD-10-CM

## 2023-07-14 NOTE — Telephone Encounter (Signed)
Please contact the patient's sister [name] and schedule the patient for a nurse visit/lab visit for cosyntropin stimulation test    Thanks   Follow-up with me in 6 months please

## 2023-07-14 NOTE — Progress Notes (Signed)
Virtual Visit via Video Note  I connected with Kathan Delose Saxer on 07/14/23  at 11:10 AM  by a video enabled telemedicine application and verified that I am speaking with the correct person using two identifiers.   I discussed the limitations of evaluation and management by telemedicine and the availability of in person appointments. The patient expressed understanding and agreed to proceed.  -Location of the patient : home -Location of the provider : Office -The names of all persons participating in the telemedicine service : Pt , sister and myself     Name: Laker Josephsen  MRN/ DOB: [id number], 17-Jan-1961    Age/ Sex: 62 y.o., male     PCP: [name], MD   Reason for Endocrinology Evaluation: Hypocalcemia and Hypothyroidism     Initial Endocrinology Clinic Visit: 11/22/2020    PATIENT IDENTIFIER: Mr. Wiktor Greenawalt is a 62 y.o., male with a past medical history of .Deafness due to scarlet fever at 15 months, COPD, and hypocalcemia and Hypothyroidism.  He has followed with [facility] since 11/22/2020 for consultative assistance with management of his Hypothyroidism/Hypocalcemia.   HISTORICAL SUMMARY:  Moved from [place]    He is accompanied by his sister [name] and sign language interpreter Lauris Poag   Lives with sister  HYPOCALCEMIA HISTORY  He was noted with hypocalcemia  Many years ago. Pt is a poor historian and most of the history was obtained from the sister.  Pt is not aware of history of hypocalcemia Per sister he has been diagnosed with this many years ago . He is on 2 Tums TID , and calcitriol 2 a day. NO D3 prescription Has not taken MVI in a couple of weeks   Denies radiation exposure       HYPOTHYROID HISTORY:   S/P total thyroidectomy at age 53 due to goiter . Has been on LT-4 replacement since then. Has history of non compliance requiring hospitalization. Since he has been living with the sister, compliance has  improved. He is using a pill box.   Unknown FH of thyroid disease  SUBJECTIVE:    Today (07/14/2023):  Mr. Higinbotham is here for Hypocalcemia and Hypothyroidism. He is accompanied by his Sister [name] .  He has not been to our clinic in 18 months.   Since that time, the patient has been diagnosed with metastatic small cell carcinoma 07/2022 with bilateral adrenal metastasis, pancreatic lesion, upper abdominal lymphadenopathy and soft tissue nodules in the abdominal wall.  He received radiation to bilateral Adrenal glands during metastasis.  Patient also received whole brain radiation for brain mets, currently on chemotherapy  He was referred here for further monitoring of adrenal insufficiency  He has been noted with low blood pressure 97/65 mmHg but this normalized by the time he had oncology visit 07/05/2023   Patient does endorse fatigue, poor appetite and weight loss He also has nausea but no vomiting Patient has alternating constipation with diarrhea He has also been noted with cough   HOME ENDOCRINE MEDICATIONS Calcitriol 0.25 mcg , 1 caps daily  calcium 600/ Vitamin D 20 mcg, 2 tabs BID -taking Tums 3 times daily instead Levothyroxine 150 mcg daily      HISTORY:  Past Medical History:  Past Medical History:  Diagnosis Date   Calcium deficiency    Cataract    COPD (chronic obstructive pulmonary disease) (HCC)    moderate emphysema   Deaf    Hypertension    Hypothyroidism    Personal history  of colonic polyps 11/03/2020   Pneumonia    August 2023   Scarlet fever    Sleep apnea    never used a Cpap, used to use O2   Thyroid disease    Tuberculosis    [place] Health department is contact with him due to hx.   Past Surgical History:  Past Surgical History:  Procedure Laterality Date   CHOLECYSTECTOMY     COLONOSCOPY     +5years   IR IMAGING GUIDED PORT INSERTION  08/14/2022   LAPAROSCOPIC RIGHT HEMI COLECTOMY N/A 02/26/2021   Procedure: LAPAROSCOPIC RIGHT  HEMI COLECTOMY;  Surgeon: Andria Meuse, MD;  Location: [facility];  Service: General;  Laterality: N/A;   THYROIDECTOMY     VIDEO BRONCHOSCOPY WITH ENDOBRONCHIAL ULTRASOUND N/A 07/24/2022   Procedure: VIDEO BRONCHOSCOPY WITH ENDOBRONCHIAL ULTRASOUND  WITH LEFT LOWER LOBE  MASS BIOPSIES;  Surgeon: Martina Sinner, MD;  Location: [facility];  Service: Pulmonary;  Laterality: N/A;   Social History:  reports that he has been smoking cigarettes. He has a 11.3 pack-year smoking history. He has never used smokeless tobacco. He reports current alcohol use of about 1.0 - 2.0 standard drink of alcohol per week. He reports current drug use. Drug: Marijuana. Family History:  Family History  Problem Relation Age of Onset   CAD Mother    Diabetes Mellitus II Mother    Diabetes Mother    CAD Father    Heart attack Father    Heart disease Father    Diabetes Sister    Colon cancer Neg Hx    Esophageal cancer Neg Hx    Stomach cancer Neg Hx    Colon polyps Neg Hx      HOME MEDICATIONS: Allergies as of 07/14/2023       Reactions   Penicillins Other (See Comments)   Did it involve swelling of the face/tongue/throat, SOB, or low BP? Unknown Did it involve sudden or severe rash/hives, skin peeling, or any reaction on the inside of your mouth or nose? Unknown Did you need to seek medical attention at a hospital or doctor's office? Unknown When did it last happen?     childhood  If all above answers are "NO", may proceed with cephalosporin use.        Medication List        Accurate as of July 14, 2023  7:10 AM. If you have any questions, ask your nurse or doctor.          amLODipine 5 MG tablet Commonly known as: NORVASC Take 1 tablet (5 mg total) by mouth daily.   atorvastatin 10 MG tablet Commonly known as: LIPITOR Take 1 tablet (10 mg total) by mouth daily.   calcitRIOL 0.25 MCG capsule Commonly known as: ROCALTROL Take 1 capsule (0.25 mcg total) by mouth daily.   calcium  carbonate 500 MG chewable tablet Commonly known as: Tums Chew 1 tablet (200 mg of elemental calcium total) by mouth 3 (three) times daily.   colestipol 1 g tablet Commonly known as: COLESTID Take 1 tablet (1 g total) by mouth 2 (two) times daily.   HYDROcodone bit-homatropine 5-1.5 MG/5ML syrup Commonly known as: HYCODAN Take 5 mLs by mouth every 6 hours as needed for cough.   ibuprofen 200 MG tablet Commonly known as: ADVIL Take 400-800 mg by mouth every 6 (six) hours as needed for moderate pain.   levothyroxine 150 MCG tablet Commonly known as: SYNTHROID TAKE 1 TABLET EVERY DAY   lidocaine-prilocaine  cream Commonly known as: EMLA Apply to affected area once   loperamide 2 MG tablet Commonly known as: Imodium A-D Take 1 tablet (2 mg total) by mouth 4 (four) times daily as needed for diarrhea or loose stools.   ondansetron 8 MG tablet Commonly known as: Zofran Take 1 tablet (8 mg total) by mouth every 8 (eight) hours as needed for nausea, vomiting or refractory nausea / vomiting. Start on the third day after carboplatin. What changed: when to take this   potassium chloride SA 20 MEQ tablet Commonly known as: KLOR-CON M Take 1 tablet (20 mEq total) by mouth daily.   prochlorperazine 10 MG tablet Commonly known as: COMPAZINE Take 1 tablet (10 mg total) by mouth every 6 (six) hours as needed. What changed: reasons to take this   triamcinolone cream 0.1 % Commonly known as: KENALOG Apply 1 Application topically 2 (two) times daily as needed.          OBJECTIVE:   PHYSICAL EXAM: VS: There were no vitals taken for this visit.   EXAM: General: Pt appears well and is in NAD     DATA REVIEWED:   Latest Reference Range & Units 07/05/23 08:42  Sodium 135 - 145 mmol/L 139  Potassium 3.5 - 5.1 mmol/L 4.0  Chloride 98 - 111 mmol/L 104  CO2 22 - 32 mmol/L 26  Glucose 70 - 99 mg/dL 440 (H)  BUN 8 - 23 mg/dL 12  Creatinine 3.47 - 4.25 mg/dL 9.56  Calcium 8.9 -  38.7 mg/dL 7.1 (L)  Anion gap 5 - 15  9  Alkaline Phosphatase 38 - 126 U/L 68  Albumin 3.5 - 5.0 g/dL 4.0  AST 15 - 41 U/L 22  ALT 0 - 44 U/L 21  Total Protein 6.5 - 8.1 g/dL 6.7  Total Bilirubin <5.6 mg/dL 0.4  GFR, Est Non African American >60 mL/min >60     ASSESSMENT / PLAN / RECOMMENDATIONS:   Hypoparathyroidism     -The goals of therapy in patients with hypoparathyroidism are to relieve symptoms, to raise and maintain the serum calcium concentration in the low-normal range( 8.0 to 9 mg/dL), and to prevent iatrogenic development of kidney stones.  -Unfortunately he is not on Caltrate anymore but rather on Tums 3 times daily which is less calcium than previously prescribed -I have again emphasized the importance of taking Caltrate as below   Medications :  -Start Caltrate 600 mg, 2 tablets with Lunch and 2 tablet with Supper -Continue calcitriol 0.25 mcg 1 capsule a day       2. Post Surgical Hypothyroidism:   - No local neck symptoms -TSH normalized earlier this year   Medications Start levothyroxine 150 mcg daily       3. Vitamin D Insufficiency:    -He was on vitamin D through his calcium tablets, but he is currently on Tums which does not contain vitamin D - patient will return for repeat labs   4.  Metastatic small cell carcinoma:  -He is s/p radiation to the adrenal gland, patient does endorse nausea, fatigue, and alternating bowel movements -Patient will be scheduled for cosyntropin stimulation test due to high risk of developing adrenal insufficiency  F/U in 6 months     Signed electronically by: Lyndle Herrlich, MD  [facility]  [facility] [address] [place] [place], [place] [address] Phone: [phone] FAX: [phone]      CC: [name], MD [address]  [place] [address] Phone: [phone]  Fax: [phone]   Return to Endocrinology clinic as below: Future Appointments   Date Time Provider Department Center  07/14/2023 11:10 AM Jaymian Bogart, Konrad Dolores, MD [facility] None  08/02/2023 11:00 AM [facility] FLUSH [facility] None  08/02/2023 11:30 AM Heilingoetter, Cassandra L, PA-C [facility] None  08/02/2023 12:15 PM [facility] INFUSION [facility] None  08/30/2023  9:00 AM [facility] FLUSH [facility] None  08/30/2023  9:30 AM Si Gaul, MD [facility] None  08/30/2023 10:30 AM [facility] INFUSION [facility] None  08/30/2023  1:10 PM [name], MD [facility] None  10/12/2023  2:00 PM GI-315 MR 3 GI-315MRI GI-[facility] [address]  10/18/2023  7:00 AM [facility] BOARD CONFERENCE [facility] None

## 2023-07-19 ENCOUNTER — Other Ambulatory Visit: Payer: Self-pay

## 2023-07-19 DIAGNOSIS — E2089 Other specified hypoparathyroidism: Secondary | ICD-10-CM

## 2023-07-19 DIAGNOSIS — E559 Vitamin D deficiency, unspecified: Secondary | ICD-10-CM

## 2023-07-20 ENCOUNTER — Ambulatory Visit (INDEPENDENT_AMBULATORY_CARE_PROVIDER_SITE_OTHER): Payer: Medicare HMO

## 2023-07-20 ENCOUNTER — Other Ambulatory Visit: Payer: Medicare HMO

## 2023-07-20 VITALS — Ht 70.0 in | Wt 149.0 lb

## 2023-07-20 DIAGNOSIS — E2089 Other specified hypoparathyroidism: Secondary | ICD-10-CM | POA: Diagnosis not present

## 2023-07-20 DIAGNOSIS — E559 Vitamin D deficiency, unspecified: Secondary | ICD-10-CM | POA: Diagnosis not present

## 2023-07-20 MED ORDER — COSYNTROPIN 0.25 MG IJ SOLR
0.2500 mg | Freq: Once | INTRAMUSCULAR | Status: AC
  Administered 2023-07-20: 0.25 mg via INTRAMUSCULAR

## 2023-07-20 MED ORDER — COSYNTROPIN 0.25 MG IJ SOLR: 0.2500 mg | Freq: Once | INTRAMUSCULAR | Status: DC

## 2023-07-20 NOTE — Progress Notes (Signed)
After obtaining consent, and per orders of Dr. Lonzo Cloud, injection of Cosyntropin 1ml  given by Havyn Ramo L Jurnee Nakayama. Patient instructed to remain in clinic for 20 minutes afterwards, and to report any adverse reaction to me immediately.

## 2023-07-21 LAB — CORTISOL, 3 SPECIMEN
SPECIMEN 2: 38.6 ug/dL
Specimen: 26.5 ug/dL
Specimen: 33.4 ug/dL
Time 1: 920
Time.: 849
Time: 814

## 2023-07-25 LAB — TSH: TSH: 2.31 m[IU]/L (ref 0.40–4.50)

## 2023-07-25 LAB — VITAMIN D 25 HYDROXY (VIT D DEFICIENCY, FRACTURES): Vit D, 25-Hydroxy: 45 ng/mL (ref 30–100)

## 2023-07-25 LAB — ACTH: C206 ACTH: 9 pg/mL (ref 6–50)

## 2023-07-28 DIAGNOSIS — R001 Bradycardia, unspecified: Secondary | ICD-10-CM | POA: Diagnosis not present

## 2023-07-29 ENCOUNTER — Telehealth: Payer: Self-pay | Admitting: Medical Oncology

## 2023-07-29 NOTE — Progress Notes (Unsigned)
[facility] OFFICE PROGRESS NOTE  Brett Kitty, MD [address] [place] [address]  DIAGNOSIS: Extensive stage (T4, N3, M1 C) small cell lung cancer presented with obstructing left lower lobe lung mass with left extrapleural lymph node, bilateral adrenal metastasis, pancreatic lesion as well as upper abdominal lymphadenopathy and soft tissue nodules in the abdominal wall as well as the abdomen diagnosed in December 2023.   PRIOR THERAPY:  1) Whole brain irradiation for brain metastasis. Completed on 10/02/22  2) Palliative radiation to the left and right adrenal gland 06/09/23-06/25/23  CURRENT THERAPY: Systemic chemotherapy with carboplatin for AUC of 5 on day 1, etoposide 100 Mg/M2 on days 1, 2 and 3 with Cosela 240 Mg/M2 on the days of the chemotherapy and Imfinzi 1500 Mg IV every 3 weeks.  First dose August 10, 2022.  Status post 13 cycles.  Starting from cycle #5 he will be on maintenance treatment with Imfinzi 1500 Mg IV every 4 weeks.   INTERVAL HISTORY: Brett Campbell 62 y.o. male returns to the clinic today for a follow-up visit accompanied by his sister and sign interpreter. The patient completed 4 cycles of chemotherapy and immunotherapy. He is currently on maintenance immunotherapy and is tolerating it well overall without any concerning complaints. He follows with endocrinology due to possible? adrenal insuffiencey due to having radiation to the bilateral adrenal glands (they are doing the test). He also sees them for hypoparathyroidism. Today he denies any fever, chills, night sweats, or unexplained weight loss.  His sister states that he is eating well.  He is fatigued but moves around the house.  Denies any pain.  Denies any chest pain or hemoptysis. She mentions she feels like his cough has changed.  He may cough occasionally but denies any consistent cough.  He relayed that his breathing is stable. He has stable dyspnea on exertion. His diarrhea has  improved/controlled as long as he takes his cholestyramine BID. He denies nausea, vomiting, or diarrhea. Denies any headache or visual changes.  He previously had a rash on his chest which has improved with the Kenalog cream. His most recent brain Mri was in November and was stable. He had a dedicated visit to review the results with radiation oncology. He recently had a restaging CT scan performed. He is here for evaluation and repeat blood work before starting cycle #14.       MEDICAL HISTORY: Past Medical History:  Diagnosis Date   Calcium deficiency    Cataract    COPD (chronic obstructive pulmonary disease) (HCC)    moderate emphysema   Deaf    Hypertension    Hypothyroidism    Personal history of colonic polyps 11/03/2020   Pneumonia    August 2023   Scarlet fever    Sleep apnea    never used a Cpap, used to use O2   Thyroid disease    Tuberculosis    [place] Health department is contact with him due to hx.    ALLERGIES:  is allergic to penicillins.  MEDICATIONS:  Current Outpatient Medications  Medication Sig Dispense Refill   amLODipine (NORVASC) 5 MG tablet Take 1 tablet (5 mg total) by mouth daily. 90 tablet 3   atorvastatin (LIPITOR) 10 MG tablet Take 1 tablet (10 mg total) by mouth daily. 90 tablet 3   calcitRIOL (ROCALTROL) 0.25 MCG capsule Take 1 capsule (0.25 mcg total) by mouth daily. 30 capsule 3   calcium carbonate (TUMS) 500 MG chewable tablet  Chew 1 tablet (200 mg of elemental calcium total) by mouth 3 (three) times daily. 90 tablet 3   colestipol (COLESTID) 1 g tablet Take 1 tablet (1 g total) by mouth 2 (two) times daily. 60 tablet 3   HYDROcodone bit-homatropine (HYCODAN) 5-1.5 MG/5ML syrup Take 5 mLs by mouth every 6 hours as needed for cough. 240 mL 0   ibuprofen (ADVIL) 200 MG tablet Take 400-800 mg by mouth every 6 (six) hours as needed for moderate pain.     levothyroxine (SYNTHROID) 150 MCG tablet TAKE 1 TABLET EVERY DAY 90 tablet 3    lidocaine-prilocaine (EMLA) cream Apply to affected area once 30 g 3   lisinopril (ZESTRIL) 10 MG tablet Take 10 mg by mouth daily. (Patient not taking: Reported on 07/14/2023)     loperamide (IMODIUM A-D) 2 MG tablet Take 1 tablet (2 mg total) by mouth 4 (four) times daily as needed for diarrhea or loose stools. 30 tablet 0   ondansetron (ZOFRAN) 8 MG tablet Take 1 tablet (8 mg total) by mouth every 8 (eight) hours as needed for nausea, vomiting or refractory nausea / vomiting. Start on the third day after carboplatin. (Patient taking differently: Take 8 mg by mouth every 6 (six) hours as needed for nausea, vomiting or refractory nausea / vomiting. Start on the third day after carboplatin.) 30 tablet 1   potassium chloride SA (KLOR-CON M) 20 MEQ tablet Take 1 tablet (20 mEq total) by mouth daily. 90 tablet 3   prochlorperazine (COMPAZINE) 10 MG tablet Take 1 tablet (10 mg total) by mouth every 6 (six) hours as needed. (Patient taking differently: Take 10 mg by mouth every 6 (six) hours as needed for nausea or vomiting.) 30 tablet 2   triamcinolone cream (KENALOG) 0.1 % Apply 1 Application topically 2 (two) times daily as needed. 453.6 g 0   Current Facility-Administered Medications  Medication Dose Route Frequency Provider Last Rate Last Admin   cosyntropin (CORTROSYN) injection 0.25 mg  0.25 mg Intravenous Once Shamleffer, Konrad Dolores, MD        SURGICAL HISTORY:  Past Surgical History:  Procedure Laterality Date   CHOLECYSTECTOMY     COLONOSCOPY     +5years   IR IMAGING GUIDED PORT INSERTION  08/14/2022   LAPAROSCOPIC RIGHT HEMI COLECTOMY N/A 02/26/2021   Procedure: LAPAROSCOPIC RIGHT HEMI COLECTOMY;  Surgeon: Andria Meuse, MD;  Location: [facility];  Service: General;  Laterality: N/A;   THYROIDECTOMY     VIDEO BRONCHOSCOPY WITH ENDOBRONCHIAL ULTRASOUND N/A 07/24/2022   Procedure: VIDEO BRONCHOSCOPY WITH ENDOBRONCHIAL ULTRASOUND  WITH LEFT LOWER LOBE  MASS BIOPSIES;  Surgeon: Martina Sinner, MD;  Location: [facility] OR;  Service: Pulmonary;  Laterality: N/A;    REVIEW OF SYSTEMS:   Review of Systems  Constitutional: Negative for appetite change, chills, fatigue, fever and unexpected weight change.  HENT:   Negative for mouth sores, nosebleeds, sore throat and trouble swallowing.   Eyes: Negative for eye problems and icterus.  Respiratory: Negative for cough, hemoptysis, shortness of breath and wheezing.   Cardiovascular: Negative for chest pain and leg swelling.  Gastrointestinal: Negative for abdominal pain, constipation, diarrhea, nausea and vomiting.  Genitourinary: Negative for bladder incontinence, difficulty urinating, dysuria, frequency and hematuria.   Musculoskeletal: Negative for back pain, gait problem, neck pain and neck stiffness.  Skin: Negative for itching and rash.  Neurological: Negative for dizziness, extremity weakness, gait problem, headaches, light-headedness and seizures.  Hematological: Negative for adenopathy. Does not bruise/bleed easily.  Psychiatric/Behavioral: Negative for confusion, depression and sleep disturbance. The patient is not nervous/anxious.     PHYSICAL EXAMINATION:  There were no vitals taken for this visit.  ECOG PERFORMANCE STATUS: {CHL ONC ECOG [id number]  Physical Exam  Constitutional: Oriented to person, place, and time and well-developed, well-nourished, and in no distress. No distress.  HENT:  Head: Normocephalic and atraumatic.  Mouth/Throat: Oropharynx is clear and moist. No oropharyngeal exudate.  Eyes: Conjunctivae are normal. Right eye exhibits no discharge. Left eye exhibits no discharge. No scleral icterus.  Neck: Normal range of motion. Neck supple.  Cardiovascular: Normal rate, regular rhythm, normal heart sounds and intact distal pulses.   Pulmonary/Chest: Effort normal and breath sounds normal. No respiratory distress. No wheezes. No rales.  Abdominal: Soft. Bowel sounds are normal. Exhibits no  distension and no mass. There is no tenderness.  Musculoskeletal: Normal range of motion. Exhibits no edema.  Lymphadenopathy:    No cervical adenopathy.  Neurological: Alert and oriented to person, place, and time. Exhibits normal muscle tone. Gait normal. Coordination normal.  Skin: Skin is warm and dry. No rash noted. Not diaphoretic. No erythema. No pallor.  Psychiatric: Mood, memory and judgment normal.  Vitals reviewed.  LABORATORY DATA: Lab Results  Component Value Date   WBC 8.7 07/05/2023   HGB 14.5 07/05/2023   HCT 40.3 07/05/2023   MCV 89.8 07/05/2023   PLT 126 (L) 07/05/2023      Chemistry      Component Value Date/Time   NA 139 07/05/2023 0842   NA 139 04/28/2022 1038   K 4.0 07/05/2023 0842   CL 104 07/05/2023 0842   CO2 26 07/05/2023 0842   BUN 12 07/05/2023 0842   BUN 14 04/28/2022 1038   CREATININE 1.14 07/05/2023 0842      Component Value Date/Time   CALCIUM 7.1 (L) 07/05/2023 0842   ALKPHOS 68 07/05/2023 0842   AST 22 07/05/2023 0842   ALT 21 07/05/2023 0842   BILITOT 0.4 07/05/2023 0842       RADIOGRAPHIC STUDIES:  CT CHEST ABDOMEN PELVIS W CONTRAST  Result Date: 07/23/2023 CLINICAL DATA:  Metastatic lung cancer restaging * Tracking Code: BO * EXAM: CT CHEST, ABDOMEN, AND PELVIS WITH CONTRAST TECHNIQUE: Multidetector CT imaging of the chest, abdomen and pelvis was performed following the standard protocol during bolus administration of intravenous contrast. RADIATION DOSE REDUCTION: This exam was performed according to the departmental dose-optimization program which includes automated exposure control, adjustment of the mA and/or kV according to patient size and/or use of iterative reconstruction technique. CONTRAST:  OMNIPAQUE IOHEXOL 300 MG/ML  SOLN COMPARISON:  04/09/2023 FINDINGS: CT CHEST FINDINGS Cardiovascular: Right chest port catheter. Aortic atherosclerosis. Normal heart size. Scattered left coronary artery calcifications. No  pericardial effusion. Mediastinum/Nodes: No enlarged mediastinal, hilar, or axillary lymph nodes. Thyroidectomy clips. Trachea, and esophagus demonstrate no significant findings. Lungs/Pleura: Mild, predominantly paraseptal emphysema. Diffuse bilateral bronchial wall thickening. Unchanged post treatment appearance of the left lower lobe, with bandlike anterior consolidation and volume loss (series 6, image 105). No mass discretely visualized. Unchanged 0.4 cm nodule in the adjacent left lower lobe (series 6, image 106). Unchanged 0.4 cm nodule in the superior segment left lower lobe (series 6, image 61). No pleural effusion or pneumothorax. Musculoskeletal: No chest wall abnormality. No acute osseous findings. CT ABDOMEN PELVIS FINDINGS Hepatobiliary: No focal liver abnormality is seen. Status post cholecystectomy. No biliary dilatation. Pancreas: Unremarkable. No pancreatic ductal dilatation or surrounding inflammatory changes. Spleen: Normal in size without  significant abnormality. Adrenals/Urinary Tract: Slightly diminished size of left adrenal mass measuring 3.0 x 1.6 cm, previously 3.8 x 1.2 cm (series 2, image 67). Slightly diminished size of smaller lateral limb left adrenal nodule measuring 1.5 x 1.1 cm, previously 2.0 x 2.0 cm (series 2, image 63). Slightly diminished size of right adrenal nodule measuring 2.3 x 1.6 cm, previously 2.6 x 2.3 cm (series 2, image 61). Simple, benign bilateral renal cortical cysts, for which no further follow-up or characterization is required. No calculi or hydronephrosis. Stomach/Bowel: Stomach is within normal limits. Appendix appears normal. No evidence of bowel wall thickening, distention, or inflammatory changes. Vascular/Lymphatic: Severe aortic atherosclerosis. No enlarged abdominal or pelvic lymph nodes. Reproductive: Mild prostatomegaly. Other: No abdominal wall hernia or abnormality. No ascites. Musculoskeletal: No acute osseous findings. IMPRESSION: 1. Unchanged  post treatment appearance of the left lower lobe, with bandlike anterior consolidation and volume loss. No mass discretely visualized. 2. Unchanged small left lower lobe pulmonary nodules. 3. Slightly diminished size of bilateral adrenal metastases. 4. No new evidence of metastatic disease in the chest, abdomen, or pelvis. 5. Emphysema and diffuse bilateral bronchial wall thickening. 6. Coronary artery disease. Aortic Atherosclerosis (ICD10-I70.0) and Emphysema (ICD10-J43.9). Electronically Signed   By: Jearld Lesch M.D.   On: 07/23/2023 15:23   MR Brain W Wo Contrast  Result Date: 07/07/2023 CLINICAL DATA:  Brain metastases.  Assess treatment response. EXAM: MRI HEAD WITHOUT AND WITH CONTRAST TECHNIQUE: Multiplanar, multiecho pulse sequences of the brain and surrounding structures were obtained without and with intravenous contrast. CONTRAST:  7mL GADAVIST GADOBUTROL 1 MMOL/ML IV SOLN COMPARISON:  04/05/2023 FINDINGS: Brain: Diffusion imaging does not show any acute or subacute infarction or other cause of restricted diffusion. Chronic nuclear calcification in cerebellar hemispheres as seen previously. Two punctate foci of residual enhancement within the pons, 1 in the dorsal right pons in the other in the dorsal to mid left paramedian pons, remain stable since August. Redemonstration of a 5 mm focus of enhancement along the cortical surface of the left posterior frontal lobe is stable. No new or progressive lesion. Widespread T2 and FLAIR signal throughout the brain relates to previous whole brain radiation. No hydrocephalus or extra-axial collection. In this patient with a history of deafness, enlarged endolymphatic sacs are again demonstrated on both sides. Vascular: Major vessels at the base of the brain show flow. Skull and upper cervical spine: Negative Sinuses/Orbits: Few opacified ethmoid air cells.  Orbits negative. Other: Fluid throughout the mastoid air cells and middle ear on the right as seen  previously. No nasopharyngeal lesion is evident. IMPRESSION: 1. Stable appearance of the brain since August. Two punctate foci of residual enhancement within the pons and a 5 mm focus of enhancement along the cortical surface of the left posterior frontal lobe. No new or progressive lesion. 2. Widespread T2 and FLAIR signal throughout the brain relates to previous whole brain radiation. 3. In this patient with a history of deafness, enlarged endolymphatic sacs are again demonstrated on both sides. 4. Fluid throughout the mastoid air cells and middle ear on the right as seen previously. No nasopharyngeal lesion is evident. Electronically Signed   By: Paulina Fusi M.D.   On: 07/07/2023 10:46     ASSESSMENT/PLAN:  This is a very pleasant 62 year old Caucasian male with Extensive stage (T4, N3, M1 C) small cell lung cancer presented with obstructing left lower lobe lung mass with left extrapleural lymph node, bilateral adrenal metastasis, pancreatic lesion as well as upper  abdominal lymphadenopathy and soft tissue nodules in the abdominal wall as well as the abdomen diagnosed in December 2023.    He is undergoing treatment palliative systemic chemotherapy with carboplatin for an AUC of 5 on day 1, etoposide 100 mg/m on days 1, 2, and 3 and Imfinzi 5000 mg IV every 3 weeks.  Starting from cycle #5, he started maintenance immunotherapy with Imfinzi 1500 mg single agent every 4 weeks.  He is status post 13 cycles total.    He was found to have enlarging bilateral adrenal gland metastases. He underwent radiation to the right and left adrenal gland on 06/25/23  The patient was seen with Dr. Arbutus Ped today.  Dr. Arbutus Ped personally and independently reviewed the scan and discussed results with the patient today.  The scan showed ***.  Dr. Arbutus Ped recommends ***   Labs were reviewed. Recommend that he *** with cycle #14 today as scheduled.   We will see him back for labs and follow up visit in 4 weeks.    He  will continue cholestyramine for diarrhea, which is better at this time    He will continue using his steroid cream if needed for itching.       No orders of the defined types were placed in this encounter.    I spent {CHL ONC TIME VISIT - UEAVW:[id number]} counseling the patient face to face. The total time spent in the appointment was {CHL ONC TIME VISIT - GNFAO:[id number]}.  Detrice Cales L Jestina Stephani, PA-C 07/29/23
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
## 2023-08-02 ENCOUNTER — Ambulatory Visit: Payer: Medicare HMO | Admitting: Physician Assistant

## 2023-08-02 ENCOUNTER — Other Ambulatory Visit: Payer: Medicare HMO

## 2023-08-02 ENCOUNTER — Ambulatory Visit: Payer: Medicare HMO

## 2023-08-02 ENCOUNTER — Telehealth: Payer: Self-pay | Admitting: *Deleted

## 2023-08-02 NOTE — Telephone Encounter (Signed)
Copied from CRM [id number]. Topic: General - Deceased Patient >> 2023/08/21  9:24 AM Maxwell Marion wrote: Reason for CRM: Per patient's sister Brett Campbell, the patient passed away last night.

## 2023-08-11 NOTE — Telephone Encounter (Signed)
I tried to find his obituary to see where we could send in some flowers but I could not find it anywhere.

## 2023-08-25 DEATH — deceased

## 2023-08-30 ENCOUNTER — Ambulatory Visit: Payer: Medicare HMO

## 2023-08-30 ENCOUNTER — Ambulatory Visit: Payer: Medicare HMO | Admitting: Family Medicine

## 2023-08-30 ENCOUNTER — Other Ambulatory Visit: Payer: Medicare HMO

## 2023-08-30 ENCOUNTER — Ambulatory Visit: Payer: Medicare HMO | Admitting: Internal Medicine

## 2023-10-12 ENCOUNTER — Inpatient Hospital Stay: Admission: RE | Admit: 2023-10-12 | Payer: Medicare HMO | Source: Ambulatory Visit
